# Patient Record
Sex: Male | Born: 1935 | Race: White | Hispanic: No | Marital: Married | Smoking: Former smoker
Health system: Southern US, Community
[De-identification: ages and names within clinical notes are randomized; demographics above are authoritative.]

## PROBLEM LIST (undated history)

## (undated) DIAGNOSIS — I4891 Unspecified atrial fibrillation: Secondary | ICD-10-CM

## (undated) DIAGNOSIS — F329 Major depressive disorder, single episode, unspecified: Secondary | ICD-10-CM

## (undated) DIAGNOSIS — B351 Tinea unguium: Secondary | ICD-10-CM

## (undated) DIAGNOSIS — L98499 Non-pressure chronic ulcer of skin of other sites with unspecified severity: Secondary | ICD-10-CM

## (undated) DIAGNOSIS — F32A Depression, unspecified: Secondary | ICD-10-CM

## (undated) DIAGNOSIS — H5440 Blindness, one eye, unspecified eye: Secondary | ICD-10-CM

## (undated) DIAGNOSIS — T8092XA Unspecified transfusion reaction, initial encounter: Secondary | ICD-10-CM

## (undated) DIAGNOSIS — H3410 Central retinal artery occlusion, unspecified eye: Secondary | ICD-10-CM

## (undated) DIAGNOSIS — C61 Malignant neoplasm of prostate: Secondary | ICD-10-CM

## (undated) DIAGNOSIS — A698 Other specified spirochetal infections: Secondary | ICD-10-CM

## (undated) DIAGNOSIS — Z86718 Personal history of other venous thrombosis and embolism: Secondary | ICD-10-CM

## (undated) DIAGNOSIS — K922 Gastrointestinal hemorrhage, unspecified: Secondary | ICD-10-CM

## (undated) DIAGNOSIS — M1990 Unspecified osteoarthritis, unspecified site: Secondary | ICD-10-CM

## (undated) DIAGNOSIS — E785 Hyperlipidemia, unspecified: Secondary | ICD-10-CM

## (undated) DIAGNOSIS — Z860100 Personal history of colon polyps, unspecified: Secondary | ICD-10-CM

## (undated) DIAGNOSIS — I10 Essential (primary) hypertension: Secondary | ICD-10-CM

## (undated) DIAGNOSIS — D62 Acute posthemorrhagic anemia: Secondary | ICD-10-CM

## (undated) DIAGNOSIS — I2510 Atherosclerotic heart disease of native coronary artery without angina pectoris: Secondary | ICD-10-CM

## (undated) DIAGNOSIS — G4700 Insomnia, unspecified: Secondary | ICD-10-CM

## (undated) HISTORY — DX: Central retinal artery occlusion, unspecified eye: H34.10

## (undated) HISTORY — DX: Atherosclerotic heart disease of native coronary artery without angina pectoris: I25.10

## (undated) HISTORY — DX: Malignant neoplasm of prostate: C61

## (undated) HISTORY — DX: Personal history of colon polyps, unspecified: Z86.0100

## (undated) HISTORY — PX: OTHER SURGICAL HISTORY: SHX169

## (undated) HISTORY — DX: Personal history of colonic polyps: Z86.010

## (undated) HISTORY — DX: Unspecified transfusion reaction, initial encounter: T80.92XA

## (undated) HISTORY — DX: Personal history of other venous thrombosis and embolism: Z86.718

## (undated) HISTORY — DX: Acute posthemorrhagic anemia: D62

## (undated) HISTORY — PX: EYE SURGERY: SHX253

## (undated) HISTORY — DX: Depression, unspecified: F32.A

## (undated) HISTORY — DX: Unspecified atrial fibrillation: I48.91

## (undated) HISTORY — DX: Tinea unguium: B35.1

## (undated) HISTORY — DX: Major depressive disorder, single episode, unspecified: F32.9

## (undated) HISTORY — DX: Hyperlipidemia, unspecified: E78.5

## (undated) HISTORY — DX: Essential (primary) hypertension: I10

## (undated) HISTORY — DX: Gastrointestinal hemorrhage, unspecified: K92.2

---

## 1987-06-10 HISTORY — PX: CORONARY ARTERY BYPASS GRAFT: SHX141

## 1991-06-10 HISTORY — PX: INGUINAL HERNIA REPAIR: SUR1180

## 2001-06-09 HISTORY — PX: OTHER SURGICAL HISTORY: SHX169

## 2005-06-09 HISTORY — PX: CORONARY ARTERY BYPASS GRAFT: SHX141

## 2005-06-09 HISTORY — PX: MITRAL VALVE REPLACEMENT: SHX147

## 2006-12-22 ENCOUNTER — Encounter (HOSPITAL_COMMUNITY): Admission: RE | Admit: 2006-12-22 | Discharge: 2007-01-08 | Payer: Self-pay | Admitting: *Deleted

## 2007-01-24 ENCOUNTER — Emergency Department (HOSPITAL_COMMUNITY): Admission: EM | Admit: 2007-01-24 | Discharge: 2007-01-24 | Payer: Self-pay | Admitting: Emergency Medicine

## 2007-02-17 ENCOUNTER — Encounter (HOSPITAL_COMMUNITY): Admission: RE | Admit: 2007-02-17 | Discharge: 2007-05-18 | Payer: Self-pay | Admitting: *Deleted

## 2007-03-25 ENCOUNTER — Ambulatory Visit (HOSPITAL_COMMUNITY): Admission: RE | Admit: 2007-03-25 | Discharge: 2007-03-25 | Payer: Self-pay | Admitting: Cardiology

## 2007-05-23 ENCOUNTER — Other Ambulatory Visit: Payer: Self-pay | Admitting: Urology

## 2007-05-23 ENCOUNTER — Inpatient Hospital Stay (HOSPITAL_COMMUNITY): Admission: AD | Admit: 2007-05-23 | Discharge: 2007-05-27 | Payer: Self-pay | Admitting: Urology

## 2007-09-06 ENCOUNTER — Ambulatory Visit (HOSPITAL_COMMUNITY): Admission: RE | Admit: 2007-09-06 | Discharge: 2007-09-07 | Payer: Self-pay | Admitting: Urology

## 2007-09-09 ENCOUNTER — Emergency Department (HOSPITAL_COMMUNITY): Admission: EM | Admit: 2007-09-09 | Discharge: 2007-09-10 | Payer: Self-pay | Admitting: Emergency Medicine

## 2008-06-09 HISTORY — PX: A FLUTTER ABLATION: SHX5348

## 2008-06-20 ENCOUNTER — Encounter: Payer: Self-pay | Admitting: Family Medicine

## 2008-10-09 ENCOUNTER — Ambulatory Visit (HOSPITAL_COMMUNITY): Admission: RE | Admit: 2008-10-09 | Discharge: 2008-10-09 | Payer: Self-pay | Admitting: *Deleted

## 2008-10-09 ENCOUNTER — Encounter (INDEPENDENT_AMBULATORY_CARE_PROVIDER_SITE_OTHER): Payer: Self-pay | Admitting: *Deleted

## 2008-10-16 ENCOUNTER — Encounter: Payer: Self-pay | Admitting: Family Medicine

## 2009-03-24 ENCOUNTER — Emergency Department (HOSPITAL_COMMUNITY): Admission: EM | Admit: 2009-03-24 | Discharge: 2009-03-24 | Payer: Self-pay | Admitting: Family Medicine

## 2009-05-28 ENCOUNTER — Encounter: Payer: Self-pay | Admitting: Family Medicine

## 2009-06-09 HISTORY — PX: OTHER SURGICAL HISTORY: SHX169

## 2009-06-15 ENCOUNTER — Encounter: Payer: Self-pay | Admitting: Internal Medicine

## 2009-07-23 ENCOUNTER — Ambulatory Visit: Payer: Self-pay | Admitting: Family Medicine

## 2009-07-23 DIAGNOSIS — I10 Essential (primary) hypertension: Secondary | ICD-10-CM | POA: Insufficient documentation

## 2009-07-23 DIAGNOSIS — Z860100 Personal history of colon polyps, unspecified: Secondary | ICD-10-CM | POA: Insufficient documentation

## 2009-07-23 DIAGNOSIS — R32 Unspecified urinary incontinence: Secondary | ICD-10-CM | POA: Insufficient documentation

## 2009-07-23 DIAGNOSIS — E785 Hyperlipidemia, unspecified: Secondary | ICD-10-CM

## 2009-07-23 DIAGNOSIS — I2510 Atherosclerotic heart disease of native coronary artery without angina pectoris: Secondary | ICD-10-CM | POA: Insufficient documentation

## 2009-07-23 DIAGNOSIS — C61 Malignant neoplasm of prostate: Secondary | ICD-10-CM

## 2009-08-16 ENCOUNTER — Ambulatory Visit: Payer: Self-pay | Admitting: Family Medicine

## 2009-08-16 DIAGNOSIS — B351 Tinea unguium: Secondary | ICD-10-CM | POA: Insufficient documentation

## 2009-08-22 ENCOUNTER — Telehealth: Payer: Self-pay | Admitting: Family Medicine

## 2010-05-07 ENCOUNTER — Ambulatory Visit: Payer: Self-pay | Admitting: Cardiology

## 2010-05-07 ENCOUNTER — Encounter: Payer: Self-pay | Admitting: Family Medicine

## 2010-05-07 ENCOUNTER — Ambulatory Visit: Payer: Self-pay | Admitting: Family Medicine

## 2010-05-07 DIAGNOSIS — IMO0002 Reserved for concepts with insufficient information to code with codable children: Secondary | ICD-10-CM | POA: Insufficient documentation

## 2010-05-07 DIAGNOSIS — I4892 Unspecified atrial flutter: Secondary | ICD-10-CM | POA: Insufficient documentation

## 2010-05-08 LAB — CONVERTED CEMR LAB
ALT: 24 units/L (ref 0–53)
AST: 21 units/L (ref 0–37)
Albumin: 4.3 g/dL (ref 3.5–5.2)
Alkaline Phosphatase: 86 units/L (ref 39–117)
BUN: 17 mg/dL (ref 6–23)
Basophils Absolute: 0 10*3/uL (ref 0.0–0.1)
Basophils Relative: 0.4 % (ref 0.0–3.0)
Bilirubin, Direct: 0.1 mg/dL (ref 0.0–0.3)
CO2: 29 meq/L (ref 19–32)
Calcium: 9.3 mg/dL (ref 8.4–10.5)
Chloride: 103 meq/L (ref 96–112)
Cholesterol: 160 mg/dL (ref 0–200)
Creatinine, Ser: 1.1 mg/dL (ref 0.4–1.5)
Eosinophils Absolute: 0.1 10*3/uL (ref 0.0–0.7)
Eosinophils Relative: 1.5 % (ref 0.0–5.0)
GFR calc non Af Amer: 67.41 mL/min (ref >60)
Glucose, Bld: 96 mg/dL (ref 70–99)
HCT: 42.6 % (ref 39.0–52.0)
HDL: 52.5 mg/dL (ref >39.00)
Hemoglobin: 14.7 g/dL (ref 13.0–17.0)
LDL Cholesterol: 88 mg/dL (ref 0–99)
Lymphocytes Relative: 23.6 % (ref 12.0–46.0)
Lymphs Abs: 2 10*3/uL (ref 0.7–4.0)
MCHC: 34.5 g/dL (ref 30.0–36.0)
MCV: 92.7 fL (ref 78.0–100.0)
Monocytes Absolute: 0.6 10*3/uL (ref 0.1–1.0)
Monocytes Relative: 7.1 % (ref 3.0–12.0)
Neutro Abs: 5.7 10*3/uL (ref 1.4–7.7)
Neutrophils Relative %: 67.4 % (ref 43.0–77.0)
Platelets: 183 10*3/uL (ref 150.0–400.0)
Potassium: 4.3 meq/L (ref 3.5–5.1)
RBC: 4.59 M/uL (ref 4.22–5.81)
RDW: 13.9 % (ref 11.5–14.6)
Sodium: 140 meq/L (ref 135–145)
TSH: 0.77 microintl units/mL (ref 0.35–5.50)
Total Bilirubin: 0.5 mg/dL (ref 0.3–1.2)
Total CHOL/HDL Ratio: 3
Total Protein: 7 g/dL (ref 6.0–8.3)
Triglycerides: 96 mg/dL (ref 0.0–149.0)
VLDL: 19.2 mg/dL (ref 0.0–40.0)
WBC: 8.5 10*3/uL (ref 4.5–10.5)

## 2010-05-14 ENCOUNTER — Ambulatory Visit: Payer: Self-pay | Admitting: Cardiovascular Disease

## 2010-05-21 ENCOUNTER — Ambulatory Visit: Payer: Self-pay | Admitting: Cardiology

## 2010-06-04 ENCOUNTER — Encounter (INDEPENDENT_AMBULATORY_CARE_PROVIDER_SITE_OTHER): Payer: Self-pay | Admitting: *Deleted

## 2010-06-04 ENCOUNTER — Ambulatory Visit: Payer: Self-pay | Admitting: Internal Medicine

## 2010-06-04 ENCOUNTER — Encounter: Payer: Self-pay | Admitting: Internal Medicine

## 2010-06-05 ENCOUNTER — Ambulatory Visit (HOSPITAL_COMMUNITY)
Admission: RE | Admit: 2010-06-05 | Discharge: 2010-06-05 | Payer: Self-pay | Source: Home / Self Care | Attending: Cardiology | Admitting: Cardiology

## 2010-06-14 ENCOUNTER — Ambulatory Visit: Admission: RE | Admit: 2010-06-14 | Discharge: 2010-06-14 | Payer: Self-pay | Source: Home / Self Care

## 2010-06-14 LAB — CONVERTED CEMR LAB: POC INR: 1.5

## 2010-06-18 ENCOUNTER — Emergency Department (HOSPITAL_COMMUNITY)
Admission: EM | Admit: 2010-06-18 | Discharge: 2010-06-18 | Payer: Self-pay | Source: Home / Self Care | Admitting: Emergency Medicine

## 2010-06-19 ENCOUNTER — Telehealth: Payer: Self-pay | Admitting: Internal Medicine

## 2010-06-19 ENCOUNTER — Encounter: Payer: Self-pay | Admitting: Internal Medicine

## 2010-06-19 ENCOUNTER — Ambulatory Visit: Admission: RE | Admit: 2010-06-19 | Discharge: 2010-06-19 | Payer: Self-pay | Source: Home / Self Care

## 2010-06-20 ENCOUNTER — Telehealth: Payer: Self-pay | Admitting: Internal Medicine

## 2010-06-21 ENCOUNTER — Ambulatory Visit: Admission: RE | Admit: 2010-06-21 | Discharge: 2010-06-21 | Payer: Self-pay | Source: Home / Self Care

## 2010-06-21 LAB — CONVERTED CEMR LAB
INR: 3.5
POC INR: 3.5

## 2010-06-24 ENCOUNTER — Telehealth: Payer: Self-pay | Admitting: Internal Medicine

## 2010-06-24 LAB — POCT I-STAT, CHEM 8
BUN: 13 mg/dL (ref 6–23)
Calcium, Ion: 1.15 mmol/L (ref 1.12–1.32)
Chloride: 100 mEq/L (ref 96–112)
Creatinine, Ser: 1.2 mg/dL (ref 0.4–1.5)
Glucose, Bld: 94 mg/dL (ref 70–99)
HCT: 45 % (ref 39.0–52.0)
Hemoglobin: 15.3 g/dL (ref 13.0–17.0)
Potassium: 4 mEq/L (ref 3.5–5.1)
Sodium: 137 mEq/L (ref 135–145)
TCO2: 28 mmol/L (ref 0–100)

## 2010-06-24 LAB — CBC
HCT: 43.8 % (ref 39.0–52.0)
Hemoglobin: 15.4 g/dL (ref 13.0–17.0)
MCH: 31.2 pg (ref 26.0–34.0)
MCHC: 35.2 g/dL (ref 30.0–36.0)
MCV: 88.7 fL (ref 78.0–100.0)
Platelets: 168 10*3/uL (ref 150–400)
RBC: 4.94 MIL/uL (ref 4.22–5.81)
RDW: 13 % (ref 11.5–15.5)
WBC: 7.9 10*3/uL (ref 4.0–10.5)

## 2010-06-24 LAB — DIFFERENTIAL
Basophils Absolute: 0 10*3/uL (ref 0.0–0.1)
Basophils Relative: 0 % (ref 0–1)
Eosinophils Absolute: 0.2 10*3/uL (ref 0.0–0.7)
Eosinophils Relative: 2 % (ref 0–5)
Lymphocytes Relative: 22 % (ref 12–46)
Lymphs Abs: 1.7 10*3/uL (ref 0.7–4.0)
Monocytes Absolute: 0.5 10*3/uL (ref 0.1–1.0)
Monocytes Relative: 7 % (ref 3–12)
Neutro Abs: 5.5 10*3/uL (ref 1.7–7.7)
Neutrophils Relative %: 69 % (ref 43–77)

## 2010-06-24 LAB — PROTIME-INR
INR: 2.09 — ABNORMAL HIGH (ref 0.00–1.49)
Prothrombin Time: 23.6 seconds — ABNORMAL HIGH (ref 11.6–15.2)

## 2010-06-27 NOTE — Consult Note (Signed)
  [name], [name] NO.:  [id number]  MEDICAL RECORD NO.:  [id number]          PATIENT TYPE:  EMS  LOCATION:  [facility]                         FACILITY:  [facility]  PHYSICIAN:  Duke Salvia, MD, FACCDATE OF BIRTH:  [birth year]  DATE OF CONSULTATION:  06/18/2010 DATE OF DISCHARGE:  06/18/2010                                CONSULTATION   Mr. Vig is seen because of central retinal artery occlusion.  He has a history of coronary artery bypass surgery and mitral valve repair in 2007.  He had atrial flutter in 2010 for which he underwent cardioversion.  He had recurrent atrial flutter and was seen by me.  It was typical but because of the atriotomy, the concern about him having left atrial focus was raised, hence he was cardioverted because of exercise intolerance and symptoms with the anticipation of being seen by Dr. Johney Frame for perhaps complex of atrial ablation.  Today while driving, he had transient loss of vision in his left eye, it recovered, and then he had loss of vision again which has been persistent.  He has undergone the procedure to try to ameliorate this.  His INR on arrival was 2.09.  He has no known carotid disease.  His past medical history in addition to the above is notable for hypertension, hyperlipidemia, prostate cancer, and colonic polyps.  His past surgical history is notable for bilateral hernias, radical prostatectomy, colonoscopy, and bladder pump.  FAMILY HISTORY:  Noncontributory.  SOCIAL HISTORY:  He is retired Sport and exercise psychologist.  He is raising his 39- year-old granddaughter __________.  REVIEW OF SYSTEMS:  Negative.  PHYSICAL EXAMINATION:  VITAL SIGNS:  Stable. LUNGS:  Clear. HEART:  His sounds were regular.  His carotids were brisk and full bilaterally without bruits. EYES:  His left eye was patched. ABDOMEN:  Soft. EXTREMITIES:  Without edema.  LABORATORY DATA:  Notable for the INRs as noted above.  IMPRESSION: 1.  Central retinal artery occlusion. 2. Atrial flutter - typical and atypical status post cardioversion 2     weeks ago. 3. Status post mitral valve repair and CABG.  We will plan to continue him on Coumadin.  Review of the up to date would concur that aspirin is not to be effective to be incrementally beneficial.  We will obtain carotid Dopplers as this is the most common cause for central retinal artery occlusion, although cardiogenic source of embolism is the second most common cause.  In event that he has carotid disease that is significant ipsilateral, he will be referred for vascular surgery.  The timing will be an issue.  In the event that it is not, then we will have to rethink the plan about complex ablation as the recommendation would be long-term anticoagulation not withstanding successful ablation.     Duke Salvia, MD, [facility]     SCK/MEDQ  D:  06/18/2010  T:  06/19/2010  Job:  [id number]  Electronically Signed by Sherryl Manges MD [facility] on 06/27/2010 01:40:48 PM

## 2010-06-28 ENCOUNTER — Ambulatory Visit: Admission: RE | Admit: 2010-06-28 | Discharge: 2010-06-28 | Payer: Self-pay | Source: Home / Self Care

## 2010-06-28 LAB — CONVERTED CEMR LAB: POC INR: 2.7

## 2010-07-05 ENCOUNTER — Ambulatory Visit
Admission: RE | Admit: 2010-07-05 | Discharge: 2010-07-05 | Payer: Self-pay | Source: Home / Self Care | Attending: Internal Medicine | Admitting: Internal Medicine

## 2010-07-05 ENCOUNTER — Telehealth: Payer: Self-pay | Admitting: Internal Medicine

## 2010-07-05 LAB — CONVERTED CEMR LAB: POC INR: 2.5

## 2010-07-08 ENCOUNTER — Ambulatory Visit: Admit: 2010-07-08 | Payer: Self-pay | Admitting: Internal Medicine

## 2010-07-09 NOTE — Assessment & Plan Note (Signed)
Summary: NEW PT EST // RS   Vital Signs:  Patient profile:   77 year old male Height:      67 inches Weight:      226 pounds BMI:     35.52 Temp:     98.6 degrees F oral Pulse rate:   134 / minute BP sitting:   154 / 76  (left arm) Cuff size:   large  Vitals Entered By: Alfred Levins, CMA (July 23, 2009 9:21 AM) CC: establish   History of Present Illness: 77 yr old male here with his wife to establish with [place] after transfering from Dr. Trula Slade. He is doing well at the present time.   Preventive Screening-Counseling & Management  Alcohol-Tobacco     Smoking Status: never  Caffeine-Diet-Exercise     Does Patient Exercise: no      Drug Use:  no.        Blood Transfusions:  yes.    Allergies (verified): No Known Drug Allergies  Past History:  Past Medical History: Chickenpox Prostate Cancer, per Dr. Isabel Caprice Blood transfusion Colonic polyps, hx of Coronary artery disease, sees Dr. Peter [place] normal stress test 06-2009 Hyperlipidemia Hypertension Urinary incontinence  Past Surgical History: Heart bypass (4 vessel) in 1989 repeat heart bypass and mitral valve replacement 2007 bilateral inguinal hernia repairs 1993 radical Prostatectomy with radiation 2003 colonoscopy 2009, had adenomatous polyps, repeat in 5 yrs  Family History: Reviewed history and no changes required. Family History of Alcoholism/Addiction Family History of CAD Male 1st degree relative <50 Family History Hypertension Family History of Stroke M 1st degree relative <50 daughter died of renal cell cancer  Social History: Reviewed history and no changes required. 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 Alcohol use-yes Drug use-no Regular exercise-no raising an 37 yr old daughter adopted from [place] Transfusions:  yes Smoking Status:  never Drug Use:  no Does Patient Exercise:  no  Review of Systems  The patient denies  anorexia, fever, weight loss, weight gain, vision loss, decreased hearing, hoarseness, chest pain, syncope, dyspnea on exertion, peripheral edema, prolonged cough, headaches, hemoptysis, abdominal pain, melena, hematochezia, severe indigestion/heartburn, hematuria, incontinence, genital sores, muscle weakness, suspicious skin lesions, transient blindness, difficulty walking, depression, unusual weight change, abnormal bleeding, enlarged lymph nodes, angioedema, breast masses, and testicular masses.    Physical Exam  General:  overweight-appearing.   Neck:  No deformities, masses, or tenderness noted. Lungs:  Normal respiratory effort, chest expands symmetrically. Lungs are clear to auscultation, no crackles or wheezes. Heart:  Normal rate and regular rhythm. S1 and S2 normal without gallop, murmur, click, rub or other extra sounds.   Impression & Recommendations:  Problem # 1:  HYPERTENSION (ICD-401.9)  His updated medication list for this problem includes:    Metoprolol Tartrate 25 Mg Tabs (Metoprolol tartrate) .Marland Kitchen... 1/2 once daily    Avapro 75 Mg Tabs (Irbesartan) .Marland Kitchen... 1 by mouth daily  Problem # 2:  HYPERLIPIDEMIA (ICD-272.4)  His updated medication list for this problem includes:    Simvastatin 40 Mg Tabs (Simvastatin) .Marland Kitchen... Take one tablet at bedtime    Niacin 250 Mg Tabs (Niacin) ..... Once daily  Problem # 3:  CORONARY ARTERY DISEASE (ICD-414.00)  His updated medication list for this problem includes:    Metoprolol Tartrate 25 Mg Tabs (Metoprolol tartrate) .Marland Kitchen... 1/2 once daily    Avapro 75 Mg Tabs (Irbesartan) .Marland Kitchen... 1 by mouth daily    Aspirin 81 Mg  Tbec (Aspirin) ..... One by mouth every day  Problem # 4:  COLONIC POLYPS, HX OF (ICD-V12.72)  Problem # 5:  PROSTATE CANCER (ICD-185)  Complete Medication List: 1)  Metoprolol Tartrate 25 Mg Tabs (Metoprolol tartrate) .... 1/2 once daily 2)  Simvastatin 40 Mg Tabs (Simvastatin) .... Take one tablet at bedtime 3)  Avapro 75  Mg Tabs (Irbesartan) .Marland Kitchen.. 1 by mouth daily 4)  Niacin 250 Mg Tabs (Niacin) .... Once daily 5)  Aspirin 81 Mg Tbec (Aspirin) .... One by mouth every day 6)  Glucosamine-chondroitin 500-400 Mg Caps (Glucosamine-chondroitin) .... Once daily 7)  Multivitamins Tabs (Multiple vitamin) .... Once daily 8)  Resveratrol 100 Mg Caps (Resveratrol) .... Once daily  Patient Instructions: 1)  get old records. Due for a cpx later this summer.

## 2010-07-09 NOTE — Letter (Signed)
Summary: Records from [facility] 2008 - 2010  Records from [facility] 2008 - 2010   Imported By: Maryln Gottron 08/07/2009 14:03:48  _____________________________________________________________________  External Attachment:    Type:   Image     Comment:   External Document

## 2010-07-09 NOTE — Progress Notes (Signed)
Summary: Pt called to check on status of refill for Hydrocodone Syrup  Phone Note Call from Patient Call back at Home Phone [phone]   Caller: Patient Summary of Call: Pt called and still has cough. Pt is requesting refill of Hydrocodone Syrup  [facility] [address] Pt said that the pharmacy sent a refill request for this yesterday. Initial call taken by: Lucy Antigua,  August 22, 2009 4:00 PM  Follow-up for Phone Call        we did not receive any such request from a pharmacy, but I'll be glad to refill this. Call in Hydromet 1 tsp q 4 hours as needed cough, 240 ml with no rf Follow-up by: Nelwyn Salisbury MD,  August 22, 2009 5:25 PM  Additional Follow-up for Phone Call Additional follow up Details #1::        faxed. Additional Follow-up by: Raechel Ache, RN,  August 22, 2009 5:26 PM

## 2010-07-09 NOTE — Assessment & Plan Note (Signed)
Summary: cough/?toenail fungus/njr   Vital Signs:  Patient profile:   77 year old male Temp:     98.8 degrees F oral BP sitting:   130 / 80  (left arm) Cuff size:   large  Vitals Entered By: Sid Falcon LPN (August 16, 2009 1:47 PM) CC: cough, toenail fungus   History of Present Illness: Acute visit for the following items.  3 day history of cough which is mostly nonproductive and nasal congestion. No fever. Nonsmoker. No significant body aches. 2 episodes of similar illness this winter.denies any nausea, vomiting, or diarrhea. Increased nighttime cough not relieved with over-the-counter medication.  Probable toenail fungus left foot. Nonpainful. History similar process right foot treated with oral antifungals several years ago.  Allergies: No Known Drug Allergies  Past History:  Past Medical History: Last updated: 07/23/2009 Chickenpox Prostate Cancer, per Dr. Isabel Caprice Blood transfusion Colonic polyps, hx of Coronary artery disease, sees Dr. Peter [place]and normal stress test 06-2009 Hyperlipidemia Hypertension Urinary incontinence PMH reviewed for relevance  Review of Systems      See HPI  Physical Exam  General:  Well-developed,well-nourished,in no acute distress; alert,appropriate and cooperative throughout examination Ears:  External ear exam shows no significant lesions or deformities.  Otoscopic examination reveals clear canals, tympanic membranes are intact bilaterally without bulging, retraction, inflammation or discharge. Hearing is grossly normal bilaterally. Nose:  External nasal examination shows no deformity or inflammation. Nasal mucosa are pink and moist without lesions or exudates. Mouth:  Oral mucosa and oropharynx without lesions or exudates.  Teeth in good repair. Neck:  No deformities, masses, or tenderness noted. Lungs:  Normal respiratory effort, chest expands symmetrically. Lungs are clear to auscultation, no crackles or wheezes. Heart:  normal  rate and regular rhythm.   Extremities:  left foot reveals discolored toenail great toe and some thickened brittle changes of other toenails involving that foot. Right-sided toes normal   Impression & Recommendations:  Problem # 1:  ACUTE BRONCHITIS (ICD-466.0)  suspect viral. Cough medication for suppression as needed.  His updated medication list for this problem includes:    Hydrocodone-homatropine 5-1.5 Mg/51ml Syrp (Hydrocodone-homatropine) ..... One tsp by mouth q 4-6 hours as needed cough  Problem # 2:  ONYCHOMYCOSIS (ICD-110.1) discussed possible treatment options. At this point he is not sure he wishes to pursue this. We will discuss with Dr. Clent Ridges he is further interest.  Complete Medication List: 1)  Metoprolol Tartrate 25 Mg Tabs (Metoprolol tartrate) .... 1/2 once daily 2)  Simvastatin 40 Mg Tabs (Simvastatin) .... Take one tablet at bedtime 3)  Avapro 75 Mg Tabs (Irbesartan) .Marland Kitchen.. 1 by mouth daily 4)  Niacin 250 Mg Tabs (Niacin) .... Once daily 5)  Aspirin 81 Mg Tbec (Aspirin) .... One by mouth every day 6)  Glucosamine-chondroitin 500-400 Mg Caps (Glucosamine-chondroitin) .... Once daily 7)  Multivitamins Tabs (Multiple vitamin) .... Once daily 8)  Resveratrol 100 Mg Caps (Resveratrol) .... Once daily 9)  Hydrocodone-homatropine 5-1.5 Mg/29ml Syrp (Hydrocodone-homatropine) .... One tsp by mouth q 4-6 hours as needed cough  Patient Instructions: 1)  Acute Bronchitis symptoms for less then 10 days are not  helped by antibiotics. Take over the counter cough medications. Call if no improvement in 5-7 days, sooner if increasing cough, fever, or new symptoms ( shortness of breath, chest pain) .  Prescriptions: HYDROCODONE-HOMATROPINE 5-1.5 MG/5ML SYRP (HYDROCODONE-HOMATROPINE) one tsp by mouth q 4-6 hours as needed cough  #120 ml x 0   Entered and Authorized by:   Evelena Peat  MD   Signed by:   Evelena Peat MD on 08/16/2009   Method used:   Print then Give to Patient    RxID:   [id number]

## 2010-07-09 NOTE — Assessment & Plan Note (Signed)
Summary: CPX // RS/PT RSC/CJR--pt will fast for medicare///ccm   Vital Signs:  Patient profile:   77 year old male Height:      66.5 inches Weight:      228 pounds BMI:     36.38 O2 Sat:      96 % Temp:     97.6 degrees F Pulse rate:   94 / minute BP sitting:   140 / 80  (left arm) Cuff size:   large  Vitals Entered By: Pura Spice, RN (May 07, 2010 10:43 AM) CC: cpx fasting  c/o left knee pain  SOB  increasing getting worse   sees Dr Peter Swaziland last seen 05-2009.  pt states he is not happy with Dr Swaziland and requesting another Cardiologist   History of Present Illness: 77 yr old male for a cpx. He has 2 problems to discuss today. First he has had some SOB on exertion, such as walking distances or going up stairs, for several months. No chest pain or nausea or sweats or palpitations. He had been seeing Dr. Peter [place]iland for cardiology care, but he would like to switch to our [facility] cardiologists. Second, 2 weeks ago after squatting on his knees to change a blade on his lawn mower, he developed sharp pains in the left knee. No swelling. He has been taking some Motrin and wearing an elastic support sleeve. The knee is steadily getting better now, and he no longer is taking any meds for it. Still wearing the sleeve. He is scheduled for a colonoscopy with the [facility] next month.   Allergies (verified): No Known Drug Allergies  Past History:  Past Medical History: Reviewed history from 07/23/2009 and no changes required. Chickenpox Prostate Cancer, per Dr. Isabel Caprice Blood transfusion Colonic polyps, hx of Coronary artery disease, sees Dr. Peter [place]il[place] normal stress test 06-2009 Hyperlipidemia Hypertension Urinary incontinence  Past Surgical History: Heart bypass (4 vessel) in 1989 repeat heart bypass and mitral valve replacement 2007 bilateral inguinal hernia repairs 1993 radical Prostatectomy with radiation 2003 colonoscopy 2009, had adenomatous polyps, repeat in 5  yrs ablation for atrial fibrillation 2010 installation of bladder pump 2011 per Dr. McDiarmid  Family History: Reviewed history from 07/23/2009 and no changes required. Family History of Alcoholism/Addiction Family History of CAD Male 1st degree relative <50 Family History Hypertension Family History of Stroke M 1st degree relative <50 daughter died of renal cell cancer  Social History: Reviewed history from 07/23/2009 and no changes required. 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 Alcohol use-yes Drug use-no Regular exercise-no raising an 73 yr old daughter adopted from [place]  Review of Systems  The patient denies anorexia, fever, weight loss, weight gain, vision loss, decreased hearing, hoarseness, chest pain, syncope, peripheral edema, prolonged cough, headaches, hemoptysis, abdominal pain, melena, hematochezia, severe indigestion/heartburn, hematuria, incontinence, genital sores, muscle weakness, suspicious skin lesions, transient blindness, difficulty walking, depression, unusual weight change, abnormal bleeding, enlarged lymph nodes, angioedema, breast masses, and testicular masses.    Physical Exam  General:  overweight-appearing.  Walks with a cane. Head:  Normocephalic and atraumatic without obvious abnormalities. No apparent alopecia or balding. Eyes:  No corneal or conjunctival inflammation noted. EOMI. Perrla. Funduscopic exam benign, without hemorrhages, exudates or papilledema. Vision grossly normal. Ears:  External ear exam shows no significant lesions or deformities.  Otoscopic examination reveals clear canals, tympanic membranes are intact bilaterally without bulging, retraction, inflammation or discharge. Hearing is grossly normal  bilaterally. Nose:  External nasal examination shows no deformity or inflammation. Nasal mucosa are pink and moist without lesions or exudates. Mouth:  Oral mucosa and oropharynx without  lesions or exudates.  Teeth in good repair. Neck:  No deformities, masses, or tenderness noted. Chest Wall:  No deformities, masses, tenderness or gynecomastia noted. Lungs:  Normal respiratory effort, chest expands symmetrically. Lungs are clear to auscultation, no crackles or wheezes. Heart:  normal rate, regular rhythm, no gallop, no rub, no JVD, and no HJR.  Soft 2/6 SM over the mitral area. EKG shows a fib/flutter pattern with 3 to 1 conduction. Ventricular rate is well controlled.  Abdomen:  Bowel sounds positive,abdomen soft and non-tender without masses, organomegaly or hernias noted. Msk:  No deformity or scoliosis noted of thoracic or lumbar spine.   Pulses:  R and L carotid,radial,femoral,dorsalis pedis and posterior tibial pulses are full and equal bilaterally Extremities:  No clubbing, cyanosis, edema, or deformity noted with normal full range of motion of all joints.   Neurologic:  No cranial nerve deficits noted. Station and gait are normal. Plantar reflexes are down-going bilaterally. DTRs are symmetrical throughout. Sensory, motor and coordinative functions appear intact. Skin:  Intact without suspicious lesions or rashes Cervical Nodes:  No lymphadenopathy noted Axillary Nodes:  No palpable lymphadenopathy Inguinal Nodes:  No significant adenopathy Psych:  Cognition and judgment appear intact. Alert and cooperative with normal attention span and concentration. No apparent delusions, illusions, hallucinations   Impression & Recommendations:  Problem # 1:  HYPERTENSION (ICD-401.9)  His updated medication list for this problem includes:    Metoprolol Tartrate 25 Mg Tabs (Metoprolol tartrate) .Marland Kitchen... 1/2 once daily    Avapro 75 Mg Tabs (Irbesartan) .Marland Kitchen... 1 by mouth daily  Orders: UA Dipstick w/o Micro (automated)  (81003) Venipuncture (70350) TLB-Lipid Panel (80061-LIPID) TLB-BMP (Basic Metabolic Panel-BMET) (80048-METABOL) TLB-CBC Platelet - w/Differential  (85025-CBCD) TLB-Hepatic/Liver Function Pnl (80076-HEPATIC) TLB-TSH (Thyroid Stimulating Hormone) (84443-TSH) Specimen Handling (09381)  Problem # 2:  HYPERLIPIDEMIA (ICD-272.4)  His updated medication list for this problem includes:    Simvastatin 40 Mg Tabs (Simvastatin) .Marland Kitchen... Take one tablet at bedtime    Niacin 250 Mg Tabs (Niacin) ..... Once daily  Problem # 3:  CORONARY ARTERY DISEASE (ICD-414.00)  His updated medication list for this problem includes:    Metoprolol Tartrate 25 Mg Tabs (Metoprolol tartrate) .Marland Kitchen... 1/2 once daily    Avapro 75 Mg Tabs (Irbesartan) .Marland Kitchen... 1 by mouth daily    Aspirin 81 Mg Tbec (Aspirin) ..... One by mouth every day  Problem # 4:  PROSTATE CANCER (ICD-185)  Problem # 5:  COLONIC POLYPS, HX OF (ICD-V12.72)  Problem # 6:  URINARY INCONTINENCE (ICD-788.30)  Problem # 7:  ATRIAL FIBRILLATION (ICD-427.31)  His updated medication list for this problem includes:    Metoprolol Tartrate 25 Mg Tabs (Metoprolol tartrate) .Marland Kitchen... 1/2 once daily    Aspirin 81 Mg Tbec (Aspirin) ..... One by mouth every day  Orders: EKG w/ Interpretation (93000) Cardiology Referral (Cardiology)  Problem # 8:  KNEE SPRAIN (ICD-844.9)  Complete Medication List: 1)  Metoprolol Tartrate 25 Mg Tabs (Metoprolol tartrate) .... 1/2 once daily 2)  Simvastatin 40 Mg Tabs (Simvastatin) .... Take one tablet at bedtime 3)  Avapro 75 Mg Tabs (Irbesartan) .Marland Kitchen.. 1 by mouth daily 4)  Niacin 250 Mg Tabs (Niacin) .... Once daily 5)  Aspirin 81 Mg Tbec (Aspirin) .... One by mouth every day 6)  Glucosamine-chondroitin 500-400 Mg Caps (Glucosamine-chondroitin) .... Once daily 7)  Multivitamins Tabs (Multiple vitamin) .... Once daily 8)  Resveratrol 100 Mg Caps (Resveratrol) .... Once daily  Patient Instructions: 1)  He seems to be in paroxysmal fib/flutter which is causing some dyspnea on exertion. We will get fasting labs today, and we will arrange for him to see one of our [place]  cardiologists today or tomorrow. He will take it easy until then. He had planned to take an airline flight tomorrow, but I asked him to cancel this and stay home until we get this resolved. He will do so.    Orders Added: 1)  Est. Patient Level V [99215] 2)  UA Dipstick w/o Micro (automated)  [81003] 3)  EKG w/ Interpretation [93000] 4)  Venipuncture [36415] 5)  TLB-Lipid Panel [80061-LIPID] 6)  TLB-BMP (Basic Metabolic Panel-BMET) [80048-METABOL] 7)  TLB-CBC Platelet - w/Differential [85025-CBCD] 8)  TLB-Hepatic/Liver Function Pnl [80076-HEPATIC] 9)  TLB-TSH (Thyroid Stimulating Hormone) [84443-TSH] 10)  Specimen Handling [99000] 11)  Cardiology Referral [Cardiology]  Appended Document: Imaging     Allergies: No Known Drug Allergies   Complete Medication List: 1)  Metoprolol Tartrate 25 Mg Tabs (Metoprolol tartrate) .... 1/2 once daily 2)  Simvastatin 40 Mg Tabs (Simvastatin) .... Take one tablet at bedtime 3)  Avapro 75 Mg Tabs (Irbesartan) .Marland Kitchen.. 1 by mouth daily 4)  Niacin 250 Mg Tabs (Niacin) .... Once daily 5)  Aspirin 81 Mg Tbec (Aspirin) .... One by mouth every day 6)  Glucosamine-chondroitin 500-400 Mg Caps (Glucosamine-chondroitin) .... Once daily 7)  Multivitamins Tabs (Multiple vitamin) .... Once daily 8)  Resveratrol 100 Mg Caps (Resveratrol) .... Once daily      Laboratory Results   Urine Tests    Routine Urinalysis   Color: yellow Appearance: Clear Glucose: negative   (Normal Range: Negative) Bilirubin: negative   (Normal Range: Negative) Ketone: negative   (Normal Range: Negative) Spec. Gravity: 1.015   (Normal Range: 1.003-1.035) Blood: negative   (Normal Range: Negative) pH: 5.0   (Normal Range: 5.0-8.0) Protein: negative   (Normal Range: Negative) Urobilinogen: 0.2   (Normal Range: 0-1) Nitrite: negative   (Normal Range: Negative) Leukocyte Esterace: negative   (Normal Range: Negative)    Comments: Rita Ohara  May 07, 2010 3:52  PM

## 2010-07-11 ENCOUNTER — Encounter: Payer: Self-pay | Admitting: Internal Medicine

## 2010-07-11 ENCOUNTER — Ambulatory Visit (INDEPENDENT_AMBULATORY_CARE_PROVIDER_SITE_OTHER): Payer: Medicare Other | Admitting: Internal Medicine

## 2010-07-11 DIAGNOSIS — I4891 Unspecified atrial fibrillation: Secondary | ICD-10-CM

## 2010-07-11 LAB — CONVERTED CEMR LAB
BUN: 17 mg/dL (ref 6–23)
Basophils Absolute: 0 10*3/uL (ref 0.0–0.1)
Basophils Relative: 0.4 % (ref 0.0–3.0)
CO2: 29 meq/L (ref 19–32)
Calcium: 9.2 mg/dL (ref 8.4–10.5)
Chloride: 99 meq/L (ref 96–112)
Creatinine, Ser: 1.1 mg/dL (ref 0.4–1.5)
Eosinophils Absolute: 0.1 10*3/uL (ref 0.0–0.7)
Eosinophils Relative: 1.7 % (ref 0.0–5.0)
GFR calc non Af Amer: 72.56 mL/min (ref >60.00)
Glucose, Bld: 71 mg/dL (ref 70–99)
HCT: 43.2 % (ref 39.0–52.0)
Hemoglobin: 14.8 g/dL (ref 13.0–17.0)
INR: 3 — ABNORMAL HIGH (ref 0.8–1.0)
Lymphocytes Relative: 28.2 % (ref 12.0–46.0)
Lymphs Abs: 2.2 10*3/uL (ref 0.7–4.0)
MCHC: 34.3 g/dL (ref 30.0–36.0)
MCV: 92.4 fL (ref 78.0–100.0)
Monocytes Absolute: 0.5 10*3/uL (ref 0.1–1.0)
Monocytes Relative: 7 % (ref 3.0–12.0)
Neutro Abs: 4.9 10*3/uL (ref 1.4–7.7)
Neutrophils Relative %: 62.7 % (ref 43.0–77.0)
Platelets: 180 10*3/uL (ref 150.0–400.0)
Potassium: 4.3 meq/L (ref 3.5–5.1)
Prothrombin Time: 32.4 s — ABNORMAL HIGH (ref 9.7–11.8)
RBC: 4.67 M/uL (ref 4.22–5.81)
RDW: 13.5 % (ref 11.5–14.6)
Sodium: 138 meq/L (ref 135–145)
WBC: 7.8 10*3/uL (ref 4.5–10.5)
aPTT: 41.6 s — ABNORMAL HIGH (ref 21.7–28.8)

## 2010-07-11 NOTE — Assessment & Plan Note (Signed)
Summary: NEP/AFLUTTER   Visit Type:  np   History of Present Illness: [name] is seen at the request of Dr. Swaziland because of recurrent atrial flutter.  He has a history of bypass surgery and mitral valve repair in 2007. He was seen in 2010 by Dr. Lemmie Evens. Electrocardiograms are not available from this but atrial flutter cardioversion was described. He then noted increasing symptoms of this fall of exercise intolerance. He was seen by Dr. Swaziland. Recurrent atrial flutter was identified. Also concerns about obesity and the patient has undertaken a significant weight restriction diet and has lost 17 pounds. He continues to have exercise intolerance.  Thromboembolic risk factors are notable for age-41, hypertension-one, vascular disease-1.  Left ventricular function has been identified as normal.  Problems Prior to Update: 1)  Atrial Flutter  (ICD-427.32) 2)  Knee Sprain  (ICD-844.9) 3)  Onychomycosis  (ICD-110.1) 4)  Family History of Cad Male 1st Degree Relative <50  (ICD-V17.3) 5)  Family History of Alcoholism/addiction  (ICD-V61.41) 6)  Urinary Incontinence  (ICD-788.30) 7)  Hypertension  (ICD-401.9) 8)  Hyperlipidemia  (ICD-272.4) 9)  Coronary Artery Disease  (ICD-414.00) 10)  Colonic Polyps, Hx of  (ICD-V12.72) 11)  Prostate Cancer  (ICD-185)  Current Medications (verified): 1)  Metoprolol Tartrate 25 Mg Tabs (Metoprolol Tartrate) .... 1/2 Once Daily 2)  Simvastatin 40 Mg Tabs (Simvastatin) .... Take One Tablet At Bedtime 3)  Avapro 75 Mg  Tabs (Irbesartan) .Marland Kitchen.. 1 By Mouth Daily 4)  Niacin 250 Mg Tabs (Niacin) .... Once Daily 5)  Aspirin 81 Mg  Tbec (Aspirin) .... One By Mouth Every Day 6)  Glucosamine-Chondroitin 500-400 Mg Caps (Glucosamine-Chondroitin) .... Once Daily 7)  Multivitamins  Tabs (Multiple Vitamin) .... Once Daily 8)  Resveratrol 100 Mg Caps (Resveratrol) .... Once Daily 9)  Aspirin 81 Mg Tbec (Aspirin) .... Take One Tablet By Mouth Daily  Allergies  (verified): 1)  ! Niacin  Past History:  Past Medical History: Last updated: 07/23/2009 Chickenpox Prostate Cancer, per Dr. Isabel Caprice Blood transfusion Colonic polyps, hx of Coronary artery disease, sees Dr. Peter Swaziland normal stress test 06-2009 Hyperlipidemia Hypertension Urinary incontinence  Past Surgical History: Last updated: 05/07/2010 Heart bypass (4 vessel) in 1989 repeat heart bypass and mitral valve replacement 2007 bilateral inguinal hernia repairs 1993 radical Prostatectomy with radiation 2003 colonoscopy 2009, had adenomatous polyps, repeat in 5 yrs ablation for atrial fibrillation 2010 installation of bladder pump 2011 per Dr. 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   Vital Signs:  Patient profile:   77 year old male Height:      66.5 inches Weight:      217.50 pounds BMI:     34.70 Pulse rate:   82 / minute BP sitting:   126 / 78  (left arm) Cuff size:   regular  Vitals Entered By: Caralee Ates CMA (June 04, 2010 12:23  PM)  Physical Exam  General:  Well developed, well nourished, over the Caucasian male appearing his stated age, moderately obese in no acute distress. Head:  we'll HEENT Neck:  supple Chest Wall:  without kyphosis or scoliosis Lungs:  good auscultation Heart:   regular but rapid rate without significant murmurs or gallops Abdomen:  soft nontender with active bowel sounds without midline pulsation or hepatomegaly Msk:  without obvious skeletal deformity Pulses:  intact distal pulses Extremities:  without clubbing cyanosis or edema Neurologic:  alert and oriented and grossly normal motor and sensory function Skin:  warm and dry Cervical Nodes:  without adenopathy Psych:  engaging affect   Impression & Recommendations:  Problem # 1:  ATRIAL FLUTTER (ICD-427.32)  the patient has atrial flutter which appears to be typical. However, he undoubtedly has an atriotomy scar on his left atrium related to his mitral valve repair and with isthmus dependent and a atriotomy scar may give rise to a similar flutter wave Appearance when atriotomy is on the left side.  because of this I discussed with the patient had him see Dr. Johney Frame in the event a complex left-sided flutter ablation is necessary. Because he is in persistent flutter with a significant symptoms in the short-term, we will undertake DC cardioversion for restoration of sinus rhythm. The patient's INRs have been greater than 2 for more than 4 weeks.  I will also plan to stop his aspirin.  in the event that his warfarin was discontinued following ablation, and aspirin would need to be resumed The following medications were removed from the medication list:    Aspirin 81 Mg Tbec (Aspirin) .Marland Kitchen... Take one tablet by mouth daily His updated medication list for this problem includes:    Metoprolol Tartrate 25 Mg Tabs (Metoprolol tartrate) .Marland Kitchen... 1/2 once daily    Aspirin 81 Mg Tbec (Aspirin) ..... One by mouth every day    Warfarin Sodium 5 Mg Tabs (Warfarin sodium) .Marland Kitchen... Take as directed  Orders: TLB-BMP (Basic Metabolic Panel-BMET) (80048-METABOL) TLB-CBC Platelet - w/Differential (85025-CBCD) TLB-PT (Protime) (85610-PTP) TLB-PTT (85730-PTTL) EP Referral (Cardiology EP Ref )  Problem # 2:  CORONARY  ARTERY DISEASE (ICD-414.00) stable The following medications were removed from the medication list:    Aspirin 81 Mg Tbec (Aspirin) .Marland Kitchen... Take one tablet by mouth daily His updated medication list for this problem includes:    Metoprolol Tartrate 25 Mg Tabs (Metoprolol tartrate) .Marland Kitchen... 1/2 once daily    Aspirin 81 Mg Tbec (Aspirin) ..... One by mouth every day    Warfarin Sodium 5 Mg Tabs (Warfarin sodium) .Marland Kitchen... Take as directed  Patient Instructions: 1)  Your physician has recommended you make the following change in your medication: Stop Aspirin.  Take 1/2 tab of Coumadin on Wednesday then resume same dose of 1 tablet except 1.5 tablet  on Monday and Friday.  2)  Your physician has recommended that you have a cardioversion (DCCV).  Electrical cardioversion uses a jolt of electricity to your heart either through paddles or wired patches attached to your chest. This is a controlled, usually prescheduled, procedure. Defibrillation is done under light anesthesia in the hospital, and you usually go home the day of the procedure. This is done to get your heart back into a normal rhythm. You are not awake for the procedure. Please see the instruction sheet given to you today. 3)  See Dr. Johney Frame for Flutter ablation.

## 2010-07-11 NOTE — Medication Information (Signed)
Summary: ccr  Anticoagulant Therapy  Managed by: Weston Brass, PharmD Referring MD: Linus Orn MD: Tenny Craw MD, Gunnar Fusi Indication 1: Atrial Fibrillation Lab Used: [facility] [place] Site: [address] INR POC 1.5 INR RANGE 2.0-3.0  Dietary changes: no    Health status changes: no    Bleeding/hemorrhagic complications: no    Recent/future hospitalizations: yes       Details: recently cardioverted.  Possibly pending ablation.  Has [facility] appt on 1/30  Any changes in medication regimen? no    Recent/future dental: no  Any missed doses?: yes     Details: missed Wednesdays dose of Coumadin   Is patient compliant with meds? yes      Comments: Pt previously followed with Dr. Swaziland.  Wants to come to our office prior to procedure then possibly return to [facility] cardiology office.   Allergies: 1)  ! Niacin  Anticoagulation Management History:      The patient is taking warfarin and comes in today for a routine follow up visit.  Positive risk factors for bleeding include an age of 42 years or older.  The bleeding index is 'intermediate risk'.  Positive CHADS2 values include History of HTN.  Negative CHADS2 values include Age > 65 years old.  His last INR was 3.0 ratio.  Anticoagulation responsible provider: Tenny Craw MD, Gunnar Fusi.  INR POC: 1.5.  Cuvette Lot#: [id number].  Exp: 07/2011.    Anticoagulation Management Assessment/Plan:      The patient's current anticoagulation dose is Warfarin sodium 5 mg tabs: take as directed.  The target INR is 2.0-3.0.  The next INR is due 06/21/2010.  Anticoagulation instructions were given to patient.  Results were reviewed/authorized by Weston Brass, PharmD.  He was notified by Weston Brass PharmD.         Current Anticoagulation Instructions: INR 1.5  Take an extra 1 tablet today, 1 1/2 tablets tomorrow then resume same dose of 1 tablet every day except 1 1/2 tablets on Monday and Friday.  Recheck INR in 1 week.

## 2010-07-11 NOTE — Progress Notes (Signed)
Summary: Change in INR range  ---- Converted from flag ---- ---- 06/18/2010 6:00 PM, Nathen May, MD, [facility] wrote: DR S MR Strout had a central retinal artery occlusion.  this is presumed to be thromboembolic  we should change his target to 2.5 thanks steve ------------------------------  Phone Note From Other Clinic       Appended Document: Coumadin Clinic    Anticoagulant Therapy  Managed by: Weston Brass, PharmD Referring MD: Linus Orn MD: Tenny Craw MD, Gunnar Fusi Indication 1: Atrial Fibrillation Indication 2: central retinal artery occulsion Lab Used: [facility] [place] Site: [address] INR RANGE 2.5-3.5          Comments: Note made to update pt's INR range.   Allergies: 1)  ! Niacin  Anticoagulation Management History:      Positive risk factors for bleeding include an age of 75 years or older.  The bleeding index is 'intermediate risk'.  Positive CHADS2 values include History of HTN.  Negative CHADS2 values include Age > 62 years old.  His last INR was 3.0 ratio.  Anticoagulation responsible provider: Tenny Craw MD, Gunnar Fusi.  Exp: 07/2011.    Anticoagulation Management Assessment/Plan:      The patient's current anticoagulation dose is Warfarin sodium 5 mg tabs: take as directed.  The target INR is 2.5-3.5.  The next INR is due 06/21/2010.  Anticoagulation instructions were given to patient.  Results were reviewed/authorized by Weston Brass, PharmD.         Prior Anticoagulation Instructions: INR 1.5  Take an extra 1 tablet today, 1 1/2 tablets tomorrow then resume same dose of 1 tablet every day except 1 1/2 tablets on Monday and Friday.  Recheck INR in 1 week.

## 2010-07-11 NOTE — Medication Information (Signed)
Summary: rov/ewj  Anticoagulant Therapy  Managed by: Bethena Midget, RN, BSN Referring MD: Linus Orn MD: Tenny Craw MD, Gunnar Fusi Indication 1: Atrial Fibrillation Indication 2: central retinal artery occulsion Lab Used: [facility] [place] Site: [address] INR POC 2.5 INR RANGE 2.5-3.5  Dietary changes: no    Health status changes: no    Bleeding/hemorrhagic complications: no    Recent/future hospitalizations: no    Any changes in medication regimen? no    Recent/future dental: no  Any missed doses?: no       Is patient compliant with meds? yes       Allergies: 1)  ! Niacin  Anticoagulation Management History:      The patient is taking warfarin and comes in today for a routine follow up visit.  Positive risk factors for bleeding include an age of 21 years or older.  The bleeding index is 'intermediate risk'.  Positive CHADS2 values include History of HTN.  Negative CHADS2 values include Age > 73 years old.  His last INR was 3.5.  Anticoagulation responsible provider: Tenny Craw MD, Gunnar Fusi.  INR POC: 2.5.  Cuvette Lot#: [id number].  Exp: 06/2011.    Anticoagulation Management Assessment/Plan:      The patient's current anticoagulation dose is Warfarin sodium 5 mg tabs: take as directed.  The target INR is 2.5-3.5.  The next INR is due 07/19/2010.  Anticoagulation instructions were given to patient.  Results were reviewed/authorized by Bethena Midget, RN, BSN.  He was notified by Linward Headland, PharmD candidate.         Prior Anticoagulation Instructions: INR 2.7 Continue 5mg  everyday except 7.5mg s on Wednesdays. Recheck in one week.  Current Anticoagulation Instructions: INR 2.5  Today take 7.5mg  then Take 1 tablet everyday except 1.5 tablets on Wednesdays.   Recheck in 2 weeks.

## 2010-07-11 NOTE — Progress Notes (Signed)
Summary: pt needs call today re appt on monday  Phone Note Call from Patient   Caller: [phone] Reason for Call: Talk to Nurse Summary of Call: called pt to confirm appt and he states he was to see klein and not allred and wants to know why he is seeing [facility]red , wants a call today because appt is on monday Initial call taken by: Glynda Jaeger,  July 05, 2010 3:42 PM  Follow-up for Phone Call        Put patient in to see Dr. Graciela Husbands 07/11/10 @ 10:15am. Will cancel appt. with Dr. Johney Frame. Patient had central retinal artery occlusion and will no longer need atrial flutter ablation. Whitney Maeola Sarah RN  July 05, 2010 4:16 PM  Follow-up by: Whitney Maeola Sarah RN,  July 05, 2010 4:16 PM

## 2010-07-11 NOTE — Letter (Signed)
Summary: Cardioversion/TEE Instructions  Architectural technologist, Main Office  [address]   [place], [place] [address]   Phone: [phone]  Fax: [phone]    Cardioversion / TEE Cardioversion Instructions  06/04/2010 MRN: [id number]  [name] [address] [place], [place]  [address]  Dear [name], You are scheduled for a Cardioversion on June 05, 2010 at 10:00 am with Dr.Crenshaw.   Please arrive at the [facility] at 8:00 a.m. on the day of your procedure.  1)   DIET:  A)   Nothing to eat or drink after midnight except your medications with a sip of water.  2)   Labs today. You do not have to be fasting.  3)   MAKE SURE YOU TAKE YOUR COUMADIN.   B)   YOU MAY TAKE ALL of your remaining medications with a small amount of water.     5)  Must have a responsible person to drive you home.  6)   Bring a current list of your medications and current insurance cards.   * Special Note:  Every effort is made to have your procedure done on time. Occasionally there are emergencies that present themselves at the hospital that may cause delays. Please be patient if a delay does occur.  * If you have any questions after you get home, please call the office at [phone]. Claris Gladden, RN

## 2010-07-11 NOTE — Progress Notes (Signed)
Summary: question on test  Phone Note Call from Patient Call back at [facility] Phone [phone]   Caller: Patient Reason for Call: Talk to Nurse Summary of Call: Pt has question re blockage and test done yesterday. Initial call taken by: Roe Coombs,  June 20, 2010 8:34 AM    please see note attached to lab results--nt

## 2010-07-11 NOTE — Progress Notes (Signed)
Summary: Calling to set  procedure  Phone Note Call from Patient Call back at Home Phone [phone]   Caller: Patient Summary of Call: Calling to set up a procedure Initial call taken by: Judie Grieve,  June 24, 2010 8:21 AM  Follow-up for Phone Call        spoke w/pt and he is concerned about having another stroke and losing sight in his other eye. Dr. Graciela Husbands had mentioned a TEE to determine where clot came from if necessary. Pt adv that his eye doc felt that the clot came from the heart based on the size.  Pt expressed concern as well because he was told that Coumadin good for 80% of clots. Left msg for Dr. Graciela Husbands to call re scheduling TEE.  Follow-up by: Claris Gladden RN,  June 24, 2010 10:41 AM  Additional Follow-up for Phone Call Additional follow up Details #1::        1./16/12 1147 am adv pt that Dr. Graciela Husbands will discuss w/him tomorrow. Pt is very concerned about this clot.  Claris Gladden, RN, BSN    Additional Follow-up for Phone Call Additional follow up Details #2::    spoke with Patient  carotids were negative;  I tried to call eye MD;  he did not answer..  I spoke with the patient and told him the TEE would not add incremental direction to theraoty we will see him in two weeks as schedules Follow-up by: Nathen May, MD, [facility],  June 24, 2010 9:29 PM

## 2010-07-11 NOTE — Medication Information (Signed)
Summary: rov/sp  Anticoagulant Therapy  Managed by: Louann Sjogren, PharmD Referring MD: Linus Orn MD: Patty Sermons MD, Shaneya Taketa Indication 1: Atrial Fibrillation Indication 2: central retinal artery occulsion Lab Used: [facility] [place] Site: [address] INR POC 3.5 INR RANGE 2.5-3.5  Dietary changes: no    Health status changes: no    Bleeding/hemorrhagic complications: yes       Details: Recent blood clot in eye  Recent/future hospitalizations: yes       Details: Recent blood clot in eye- hospital this past week  Any changes in medication regimen? no    Recent/future dental: no  Any missed doses?: no       Is patient compliant with meds? yes       Allergies: 1)  ! Niacin  Anticoagulation Management History:      The patient is taking warfarin and comes in today for a routine follow up visit.  Positive risk factors for bleeding include an age of 75 years or older.  The bleeding index is 'intermediate risk'.  Positive CHADS2 values include History of HTN.  Negative CHADS2 values include Age > 14 years old.  His last INR was 3.0 ratio and today's INR is 3.5.  Anticoagulation responsible provider: Denzil Mceachron MD, Darcell Yacoub.  INR POC: 3.5.  Cuvette Lot#: [id number].  Exp: 07/2011.    Anticoagulation Management Assessment/Plan:      The patient's current anticoagulation dose is Warfarin sodium 5 mg tabs: take as directed.  The target INR is 2.5-3.5.  The next INR is due 06/28/2010.  Anticoagulation instructions were given to patient.  Results were reviewed/authorized by Louann Sjogren, PharmD.         Prior Anticoagulation Instructions: INR 1.5  Take an extra 1 tablet today, 1 1/2 tablets tomorrow then resume same dose of 1 tablet every day except 1 1/2 tablets on Monday and Friday.  Recheck INR in 1 week.   Current Anticoagulation Instructions: INR 3.5 (goal 2.5-3.5)  Take 1 tablet everyday except take 1 1/2 tablets on Wednesdays. Keep vitamin k  containing foods consistent each week.  Vitamin K containing foods thicken the blood and decrease INR. Check INR in 1 week.

## 2010-07-11 NOTE — Medication Information (Signed)
Summary: rov/amr  Anticoagulant Therapy  Managed by: Bethena Midget, RN, BSN Referring MD: Linus Orn MD: Shirlee Latch MD, Jenean Escandon Indication 1: Atrial Fibrillation Indication 2: central retinal artery occulsion Lab Used: [facility] [place] Site: [address] INR POC 2.7 INR RANGE 2.5-3.5  Dietary changes: no    Health status changes: no    Bleeding/hemorrhagic complications: no    Recent/future hospitalizations: no    Any changes in medication regimen? no    Recent/future dental: no  Any missed doses?: no       Is patient compliant with meds? yes       Allergies: 1)  ! Niacin  Anticoagulation Management History:      The patient is taking warfarin and comes in today for a routine follow up visit.  Positive risk factors for bleeding include an age of 75 years or older.  The bleeding index is 'intermediate risk'.  Positive CHADS2 values include History of HTN.  Negative CHADS2 values include Age > 70 years old.  His last INR was 3.5.  Anticoagulation responsible provider: Shirlee Latch MD, Mariaisabel Bodiford.  INR POC: 2.7.  Cuvette Lot#: [id number].  Exp: 06/2011.    Anticoagulation Management Assessment/Plan:      The patient's current anticoagulation dose is Warfarin sodium 5 mg tabs: take as directed.  The target INR is 2.5-3.5.  The next INR is due 07/05/2010.  Anticoagulation instructions were given to patient.  Results were reviewed/authorized by Bethena Midget, RN, BSN.  He was notified by Bethena Midget, RN, BSN.         Prior Anticoagulation Instructions: INR 3.5 (goal 2.5-3.5)  Take 1 tablet everyday except take 1 1/2 tablets on Wednesdays. Keep vitamin k containing foods consistent each week.  Vitamin K containing foods thicken the blood and decrease INR. Check INR in 1 week.  Current Anticoagulation Instructions: INR 2.7 Continue 5mg  everyday except 7.5mg s on Wednesdays. Recheck in one week.

## 2010-07-11 NOTE — Miscellaneous (Signed)
Summary: Orders Update  Clinical Lists Changes  Orders: Added new Test order of Carotid Duplex (Carotid Duplex) - Signed 

## 2010-07-17 NOTE — Miscellaneous (Signed)
  Clinical Lists Changes  Observations: Added new observation of US CAROTID: Mild hetrogenous plaque in both bulbs. 0-39% bilateral ICA stenosis  f/u 1 year (06/19/2010 8:10)      Carotid Doppler  Procedure date:  06/19/2010  Findings:      Mild hetrogenous plaque in both bulbs. 0-39% bilateral ICA stenosis  f/u 1 year

## 2010-07-17 NOTE — Assessment & Plan Note (Signed)
Summary: eph/ post CVA/wa   Vital Signs:  Patient profile:   77 year old male Height:      66.5 inches Weight:      204 pounds BMI:     32.55 Pulse rate:   60 / minute Pulse rhythm:   regular BP sitting:   112 / 70  (right arm)  Vitals Entered By: Jacquelin Hawking, CMA (July 11, 2010 10:24 AM)  History of Present Illness: [name] is seen in followup of atrial flutter and a recent stroke manifesting as central retinal artery occlusion.  It was decided after not to pursue catheter ablation as the symptoms were minimal and the patient would need long-term anticoagulation notwithstanding. He has a history of bypass surgery and mitral valve repair in 2007.   Thromboembolic risk factors are notable for age-51, hypertension-one, vascular disease-1. and now TIA/stroke-2  This occurred in the context of an INR that was normal 2.09  Left ventricular function has been identified as normal   the patient has some sense of his palpitations but not clearly so.  Current Medications (verified): 1)  Metoprolol Tartrate 25 Mg Tabs (Metoprolol Tartrate) .... 1/2 Once Daily 2)  Simvastatin 40 Mg Tabs (Simvastatin) .... Take One Tablet At Bedtime 3)  Avapro 75 Mg  Tabs (Irbesartan) .Marland Kitchen.. 1 By Mouth Daily 4)  Glucosamine-Chondroitin 500-400 Mg Caps (Glucosamine-Chondroitin) .... Once Daily 5)  Multivitamins  Tabs (Multiple Vitamin) .... Once Daily 6)  Resveratrol 100 Mg Caps (Resveratrol) .... Once Daily 7)  Warfarin Sodium 5 Mg Tabs (Warfarin Sodium) .... Take As Directed  Allergies (verified): 1)  ! Niacin  Past History:  Past Medical History: Last updated: 07/23/2009 Chickenpox Prostate Cancer, per Dr. Isabel Caprice Blood transfusion Colonic polyps, hx of Coronary artery disease, sees Dr. Peter [place] normal stress test 06-2009 Hyperlipidemia Hypertension Urinary incontinence  Physical Exam  General:  The patient was alert and oriented in no acute distress. HEENT Normal.  Neck veins were  flat, carotids were brisk.  Lungs were clear.  Heart sounds were regular without murmurs or gallops.  Abdomen was soft with active bowel sounds. There is no clubbing cyanosis or edema. Skin Warm and dry    Impression & Recommendations:  Problem # 1:  ATRIAL FLUTTER (ICD-427.32) the patient has reverted to sinus rhythm. With his prior stroke now we will shoot for his target INR to be 2.5-3.5. There is no role for aspirin in this situation.  In the event that symptoms recur, we would consider undertaking catheter ablation.    Will plan to see him again in 2 months time  ECG demonstrates sinus rhythm with normal intervals The following medications were removed from the medication list:    Aspirin 81 Mg Tbec (Aspirin) ..... One by mouth every day His updated medication list for this problem includes:    Metoprolol Tartrate 25 Mg Tabs (Metoprolol tartrate) .Marland Kitchen... 1/2 once daily    Warfarin Sodium 5 Mg Tabs (Warfarin sodium) .Marland Kitchen... Take as directed  Orders: EKG w/ Interpretation (93000)  Patient Instructions: 1)  Your physician recommends that you schedule a follow-up appointment in: 3 MONTHS WITH DR Graciela Husbands 2)  Your physician recommends that you continue on your current medications as directed. Please refer to the Current Medication list given to you today.   Orders Added: 1)  EKG w/ Interpretation [93000]

## 2010-07-19 ENCOUNTER — Encounter (INDEPENDENT_AMBULATORY_CARE_PROVIDER_SITE_OTHER): Payer: Medicare Other

## 2010-07-19 ENCOUNTER — Encounter: Payer: Self-pay | Admitting: Cardiology

## 2010-07-19 DIAGNOSIS — Z7901 Long term (current) use of anticoagulants: Secondary | ICD-10-CM

## 2010-07-19 DIAGNOSIS — I4891 Unspecified atrial fibrillation: Secondary | ICD-10-CM

## 2010-07-19 LAB — CONVERTED CEMR LAB: POC INR: 2.2

## 2010-07-25 NOTE — Medication Information (Signed)
Summary: [facility]in Clinic  Anticoagulant Therapy  Managed by: Georgina Pillion, PharmD Referring MD: Linus Orn MD: Daleen Squibb MD, Maisie Fus Indication 1: Atrial Fibrillation Indication 2: central retinal artery occulsion Lab Used: [facility] [place] Site: [address] INR POC 2.2 INR RANGE 2.5-3.5  Dietary changes: no    Health status changes: no    Bleeding/hemorrhagic complications: no    Recent/future hospitalizations: no    Any changes in medication regimen? no    Recent/future dental: no  Any missed doses?: no       Is patient compliant with meds? yes       Allergies: 1)  ! Niacin  Anticoagulation Management History:      Positive risk factors for bleeding include an age of 75 years or older.  The bleeding index is 'intermediate risk'.  Positive CHADS2 values include History of HTN.  Negative CHADS2 values include Age > 109 years old.  His last INR was 3.5.  Anticoagulation responsible provider: Daleen Squibb MD, Maisie Fus.  INR POC: 2.2.  Cuvette Lot#: [id number].  Exp: 06/2011.    Anticoagulation Management Assessment/Plan:      The patient's current anticoagulation dose is Warfarin sodium 5 mg tabs: take as directed.  The target INR is 2.5-3.5.  The next INR is due 08/02/2010.  Anticoagulation instructions were given to patient.  Results were reviewed/authorized by Georgina Pillion, PharmD.  He was notified by Georgina Pillion PharmD.         Prior Anticoagulation Instructions: INR 2.5  Today take 7.5mg  then Take 1 tablet everyday except 1.5 tablets on Wednesdays.   Recheck in 2 weeks.  Current Anticoagulation Instructions: Take an extra 1/2 tablet today to equal total dose of 1 1/2 tablets (7.5 mg) then change regimen to take 1 tablet daily (5 mg) except for 1 1/2 tablets (7.5 mg) on Tuesdays and Saturdays only.  INR 2.2 (goal of 2.5-3.5)

## 2010-07-31 NOTE — Progress Notes (Signed)
Summary: Office Visit  Office Visit   Imported By: Earl Many 07/22/2010 12:17:40  _____________________________________________________________________  External Attachment:    Type:   Image     Comment:   External Document

## 2010-08-01 DIAGNOSIS — I4892 Unspecified atrial flutter: Secondary | ICD-10-CM

## 2010-08-01 DIAGNOSIS — I639 Cerebral infarction, unspecified: Secondary | ICD-10-CM

## 2010-08-02 ENCOUNTER — Encounter (INDEPENDENT_AMBULATORY_CARE_PROVIDER_SITE_OTHER): Payer: Medicare Other

## 2010-08-02 ENCOUNTER — Encounter: Payer: Self-pay | Admitting: Internal Medicine

## 2010-08-02 DIAGNOSIS — Z7901 Long term (current) use of anticoagulants: Secondary | ICD-10-CM

## 2010-08-02 DIAGNOSIS — I4891 Unspecified atrial fibrillation: Secondary | ICD-10-CM

## 2010-08-02 LAB — CONVERTED CEMR LAB: POC INR: 1.8

## 2010-08-06 NOTE — Medication Information (Signed)
Summary: rov/ejm  Anticoagulant Therapy  Managed by: Georgina Pillion, PharmD Referring MD: Linus Orn MD: Tenny Craw MD, Gunnar Fusi Indication 1: Atrial Fibrillation Indication 2: central retinal artery occulsion Lab Used: [facility] [place] Site: [address] INR POC 1.8 INR RANGE 2.5-3.5  Dietary changes: no    Health status changes: no    Bleeding/hemorrhagic complications: no    Recent/future hospitalizations: no    Any changes in medication regimen? no    Recent/future dental: no  Any missed doses?: no       Is patient compliant with meds? yes       Allergies: 1)  ! Niacin  Anticoagulation Management History:      Positive risk factors for bleeding include an age of 53 years or older.  The bleeding index is 'intermediate risk'.  Positive CHADS2 values include History of HTN.  Negative CHADS2 values include Age > 63 years old.  His last INR was 3.5.  Anticoagulation responsible provider: Tenny Craw MD, Gunnar Fusi.  INR POC: 1.8.  Cuvette Lot#: [id number].  Exp: 06/2011.    Anticoagulation Management Assessment/Plan:      The patient's current anticoagulation dose is Warfarin sodium 5 mg tabs: take as directed.  The target INR is 2.5-3.5.  The next INR is due 08/16/2010.  Anticoagulation instructions were given to patient.  Results were reviewed/authorized by Georgina Pillion, PharmD.  He was notified by Georgina Pillion PharmD.         Prior Anticoagulation Instructions: Take an extra 1/2 tablet today to equal total dose of 1 1/2 tablets (7.5 mg) then change regimen to take 1 tablet daily (5 mg) except for 1 1/2 tablets (7.5 mg) on Tuesdays and Saturdays only.  INR 2.2 (goal of 2.5-3.5)  Current Anticoagulation Instructions: Take 1 extra tablet today and 2 tablets tomorrow then change regimen to take 1 tablet daily EXCEPT for 1 1/2 tablets on Tuesdays, Thursdays, and Saturdays.  INR 1.8

## 2010-08-07 ENCOUNTER — Encounter: Payer: Self-pay | Admitting: Internal Medicine

## 2010-08-16 ENCOUNTER — Encounter (INDEPENDENT_AMBULATORY_CARE_PROVIDER_SITE_OTHER): Payer: Medicare Other

## 2010-08-16 ENCOUNTER — Encounter: Payer: Self-pay | Admitting: Cardiology

## 2010-08-16 DIAGNOSIS — Z7901 Long term (current) use of anticoagulants: Secondary | ICD-10-CM

## 2010-08-16 DIAGNOSIS — I4891 Unspecified atrial fibrillation: Secondary | ICD-10-CM

## 2010-08-16 LAB — CONVERTED CEMR LAB: POC INR: 2.4

## 2010-08-19 LAB — CBC
HCT: 43.9 % (ref 39.0–52.0)
Hemoglobin: 15.1 g/dL (ref 13.0–17.0)
MCH: 30.5 pg (ref 26.0–34.0)
MCHC: 34.4 g/dL (ref 30.0–36.0)
MCV: 88.7 fL (ref 78.0–100.0)
Platelets: 158 10*3/uL (ref 150–400)
RBC: 4.95 MIL/uL (ref 4.22–5.81)
RDW: 13 % (ref 11.5–15.5)
WBC: 6.2 10*3/uL (ref 4.0–10.5)

## 2010-08-19 LAB — BASIC METABOLIC PANEL
BUN: 14 mg/dL (ref 6–23)
CO2: 28 mEq/L (ref 19–32)
Calcium: 9.4 mg/dL (ref 8.4–10.5)
Chloride: 104 mEq/L (ref 96–112)
Creatinine, Ser: 1.1 mg/dL (ref 0.4–1.5)
GFR calc Af Amer: >60 mL/min (ref >60)
GFR calc non Af Amer: >60 mL/min (ref >60)
Glucose, Bld: 98 mg/dL (ref 70–99)
Potassium: 4.1 mEq/L (ref 3.5–5.1)
Sodium: 140 mEq/L (ref 135–145)

## 2010-08-19 LAB — PROTIME-INR
INR: 2.81 — ABNORMAL HIGH (ref 0.00–1.49)
Prothrombin Time: 29.7 s — ABNORMAL HIGH (ref 11.6–15.2)

## 2010-08-19 LAB — APTT: aPTT: 45 seconds — ABNORMAL HIGH (ref 24–37)

## 2010-08-20 NOTE — Medication Information (Signed)
Summary: RX Refill Authorization Request  RX Refill Authorization Request   Imported By: Earl Many 08/16/2010 13:18:54  _____________________________________________________________________  External Attachment:    Type:   Image     Comment:   External Document

## 2010-08-20 NOTE — Medication Information (Signed)
Summary: rov/pc  Anticoagulant Therapy  Managed by: Cloyde Reams, RN, BSN Referring MD: Linus Orn MD: Riley Kill MD, Maisie Fus Indication 1: Atrial Fibrillation Indication 2: central retinal artery occulsion Lab Used: [facility] [place] Site: [address] INR POC 2.4 INR RANGE 2.5-3.5  Dietary changes: no    Health status changes: no    Bleeding/hemorrhagic complications: no    Recent/future hospitalizations: no    Any changes in medication regimen? no    Recent/future dental: no  Any missed doses?: no       Is patient compliant with meds? yes       Allergies: 1)  ! Niacin  Anticoagulation Management History:      The patient is taking warfarin and comes in today for a routine follow up visit.  Positive risk factors for bleeding include an age of 45 years or older.  The bleeding index is 'intermediate risk'.  Positive CHADS2 values include History of HTN.  Negative CHADS2 values include Age > 77 years old.  His last INR was 3.5.  Anticoagulation responsible provider: Riley Kill MD, Maisie Fus.  INR POC: 2.4.  Cuvette Lot#: [id number].  Exp: 08/2011.    Anticoagulation Management Assessment/Plan:      The patient's current anticoagulation dose is Warfarin sodium 5 mg tabs: Take as directed by coumadin clinic..  The target INR is 2.5-3.5.  The next INR is due 09/13/2010.  Anticoagulation instructions were given to patient.  Results were reviewed/authorized by Cloyde Reams, RN, BSN.  He was notified by Cloyde Reams RN.         Prior Anticoagulation Instructions: Take 1 extra tablet today and 2 tablets tomorrow then change regimen to take 1 tablet daily EXCEPT for 1 1/2 tablets on Tuesdays, Thursdays, and Saturdays.  INR 1.8  Current Anticoagulation Instructions: INR 2.4  Take an extra 1/2 tablet today, then resume same dosage 1 tablet daily except 1.5 tablets on Tuesdays, Thursdays, and Saturdays.  Recheck in 4 weeks.   Prescriptions: WARFARIN SODIUM 5 MG  TABS (WARFARIN SODIUM) Take as directed by coumadin clinic.  #45 x 2   Entered by:   Cloyde Reams RN   Authorized by:   Nathen May, MD, [facility]   Signed by:   Cloyde Reams RN on 08/16/2010   Method used:   Faxed to ...       [facility] (retail)       [address]       [address]       [place], [place]  [address]  [place]       Ph: [phone]       Fax: [phone]   RxID:   [id number]

## 2010-09-06 ENCOUNTER — Encounter: Payer: Self-pay | Admitting: Family Medicine

## 2010-09-12 ENCOUNTER — Encounter: Payer: Medicare Other | Admitting: *Deleted

## 2010-09-12 LAB — POCT URINALYSIS DIP (DEVICE)
Bilirubin Urine: NEGATIVE
Glucose, UA: NEGATIVE mg/dL
Hgb urine dipstick: NEGATIVE
Nitrite: NEGATIVE

## 2010-09-23 ENCOUNTER — Ambulatory Visit (INDEPENDENT_AMBULATORY_CARE_PROVIDER_SITE_OTHER): Payer: Medicare Other | Admitting: Family Medicine

## 2010-09-23 ENCOUNTER — Encounter: Payer: Self-pay | Admitting: Family Medicine

## 2010-09-23 VITALS — BP 120/80 | HR 81 | Temp 98.4°F | Wt 210.0 lb

## 2010-09-23 DIAGNOSIS — K635 Polyp of colon: Secondary | ICD-10-CM

## 2010-09-23 DIAGNOSIS — L570 Actinic keratosis: Secondary | ICD-10-CM

## 2010-09-23 DIAGNOSIS — C61 Malignant neoplasm of prostate: Secondary | ICD-10-CM

## 2010-09-23 DIAGNOSIS — Z8546 Personal history of malignant neoplasm of prostate: Secondary | ICD-10-CM

## 2010-09-23 LAB — PSA: PSA: 0 ng/mL — ABNORMAL LOW (ref 0.10–4.00)

## 2010-09-23 NOTE — Progress Notes (Signed)
  Subjective:    Patient ID: [name], male    DOB: Feb 14, 1934, 77 y.o.   MRN: [id number]  HPI Here for several issues. First he asks to have a PSA drawn since he has had a prostatectomy. He is due to see Dr. Isabel Caprice later this year. Second, he asks me to check some moles on his back and head. He does wear hats but has had a lot of sun exposure in his life. Third, he would like to set up a colonoscopy since he has a hx of polyps.    Review of Systems  Constitutional: Negative.   Respiratory: Negative.   Cardiovascular: Negative.        Objective:   Physical Exam  Cardiovascular: Normal rate, normal heart sounds and intact distal pulses.  An irregularly irregular rhythm present. Exam reveals no gallop and no friction rub.   No murmur heard. Pulmonary/Chest: Effort normal and breath sounds normal.  Skin:       He has an actinic keratosis with other scattered actinic damage on the vertex of the scalp. He has numerous seborrheic keratoses over the trunk.           Assessment & Plan:  We will refer him for a colonoscopy and to see Dermatology. Get a PSA today.

## 2010-09-24 ENCOUNTER — Telehealth: Payer: Self-pay

## 2010-09-24 NOTE — Telephone Encounter (Signed)
Pt aware.

## 2010-09-24 NOTE — Telephone Encounter (Signed)
Message copied by Madison Hickman on Tue Sep 24, 2010  8:01 AM ------      Message from: Dwaine Deter      Created: Tue Sep 24, 2010  5:39 AM       PSA is 0.00 as expected

## 2010-09-25 ENCOUNTER — Encounter: Payer: Self-pay | Admitting: Internal Medicine

## 2010-09-25 ENCOUNTER — Ambulatory Visit (INDEPENDENT_AMBULATORY_CARE_PROVIDER_SITE_OTHER): Payer: Medicare Other | Admitting: Internal Medicine

## 2010-09-25 ENCOUNTER — Ambulatory Visit (INDEPENDENT_AMBULATORY_CARE_PROVIDER_SITE_OTHER): Payer: Medicare Other | Admitting: Cardiovascular Disease

## 2010-09-25 DIAGNOSIS — I10 Essential (primary) hypertension: Secondary | ICD-10-CM

## 2010-09-25 DIAGNOSIS — I6350 Cerebral infarction due to unspecified occlusion or stenosis of unspecified cerebral artery: Secondary | ICD-10-CM

## 2010-09-25 DIAGNOSIS — I4892 Unspecified atrial flutter: Secondary | ICD-10-CM

## 2010-09-25 DIAGNOSIS — I639 Cerebral infarction, unspecified: Secondary | ICD-10-CM

## 2010-09-25 DIAGNOSIS — I2510 Atherosclerotic heart disease of native coronary artery without angina pectoris: Secondary | ICD-10-CM

## 2010-09-25 LAB — POCT INR: INR: 2.2

## 2010-09-25 MED ORDER — LISINOPRIL 20 MG PO TABS: 20.0000 mg | ORAL_TABLET | Freq: Every day | ORAL | Status: DC

## 2010-09-25 NOTE — Assessment & Plan Note (Signed)
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 He will need to be maintained on Coumadin lifelong. He will need bridging at the time of his colonoscopy.

## 2010-09-25 NOTE — Assessment & Plan Note (Signed)
Given the cost of Avapro we'll try him on an ACE inhibitor. We'll start him on lisinopril 20 we will review the side effects

## 2010-09-25 NOTE — Assessment & Plan Note (Signed)
No known recurrent arrhythmia; we'll continue him on anticoagulation because of his prior stroke

## 2010-09-25 NOTE — Patient Instructions (Addendum)
Your physician recommends that you schedule a follow-up appointment in: 6 MONTHS WITH DR Graciela Husbands  Your physician has recommended you make the following change in your medication: STOP AVAPRO AND START LISINOPRIL 20 MG 1 QD

## 2010-09-25 NOTE — Assessment & Plan Note (Signed)
Stable and will continue current medications

## 2010-09-25 NOTE — Progress Notes (Signed)
  HPI  [name] is a 77 y.o. male  seen in followup of atrial flutter and a recent stroke manifesting as central retinal artery occlusion.  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  The patient denies chest pain, shortness of breath, nocturnal dyspnea, orthopnea or peripheral edema.  There have been no palpitations, lightheadedness or syncope.       Past Medical History  Diagnosis Date  . Chickenpox   . Prostate cancer   . Blood transfusion abn reaction or complication, no procedure mishap   . History of colonic polyps   . Coronary artery disease   . Normal cardiac stress test   . Hyperlipidemia   . Hypertension   . Urinary incontinence     Past Surgical History  Procedure Date  . Heart bypass   . Repeat heart bypass 2007    and mitral valve replacement   . Bilateral inguinal hernia repairs 1993  . Radical prostatectomy with radiation   . Ablation for atrial fibrillation   . Installation of blader pump 2011    dr. McDiarmid    Current Outpatient Prescriptions  Medication Sig Dispense Refill  . glucosamine-chondroitin 500-400 MG tablet Take 1 tablet by mouth daily.        . irbesartan (AVAPRO) 75 MG tablet Take 75 mg by mouth daily.        . metoprolol tartrate (LOPRESSOR) 25 MG tablet Take by mouth. Take 1/2 tab by mouth once daily        . Multiple Vitamin (MULTIVITAMIN) capsule Take 1 capsule by mouth daily.        Marland Kitchen RESVERATROL 100 MG CAPS Take by mouth daily.        . simvastatin (ZOCOR) 40 MG tablet Take 40 mg by mouth at bedtime.        Marland Kitchen warfarin (COUMADIN) 5 MG tablet Take by mouth as directed.          Allergies  Allergen Reactions  . Niacin     Review of Systems negative except from HPI and PMH  Physical Exam Well developed and well nourished  in no acute distress HENT normal E scleral and icterus clear Neck Supple JVP flat; carotids brisk and full Clear to ausculation Regular rate and rhythm, no murmurs gallops or rub Soft with active bowel sounds No clubbing cyanosis and edema Alert and oriented, grossly normal motor and sensory function Skin Warm and Dry  Assessment and  Plan

## 2010-10-08 ENCOUNTER — Ambulatory Visit (INDEPENDENT_AMBULATORY_CARE_PROVIDER_SITE_OTHER): Payer: Medicare Other | Admitting: *Deleted

## 2010-10-08 DIAGNOSIS — I6350 Cerebral infarction due to unspecified occlusion or stenosis of unspecified cerebral artery: Secondary | ICD-10-CM

## 2010-10-08 DIAGNOSIS — I4892 Unspecified atrial flutter: Secondary | ICD-10-CM

## 2010-10-08 DIAGNOSIS — I639 Cerebral infarction, unspecified: Secondary | ICD-10-CM

## 2010-10-08 LAB — POCT INR: INR: 2.4

## 2010-10-09 ENCOUNTER — Encounter: Payer: Medicare Other | Admitting: *Deleted

## 2010-10-22 ENCOUNTER — Ambulatory Visit (INDEPENDENT_AMBULATORY_CARE_PROVIDER_SITE_OTHER): Payer: Medicare Other | Admitting: *Deleted

## 2010-10-22 DIAGNOSIS — I639 Cerebral infarction, unspecified: Secondary | ICD-10-CM

## 2010-10-22 DIAGNOSIS — I6350 Cerebral infarction due to unspecified occlusion or stenosis of unspecified cerebral artery: Secondary | ICD-10-CM

## 2010-10-22 DIAGNOSIS — I4892 Unspecified atrial flutter: Secondary | ICD-10-CM

## 2010-10-22 NOTE — Op Note (Signed)
[name], [name]               ACCOUNT NO.:  [id number]   MEDICAL RECORD NO.:  [id number]          PATIENT TYPE:  OIB   LOCATION:  0098                         FACILITY[facility]  [facility]   PHYSICIAN:  Martina Sinner, MD DATE OF BIRTH:  1933-11-27   DATE OF PROCEDURE:  09/06/2007  DATE OF DISCHARGE:                               OPERATIVE REPORT   PREOPERATIVE DIAGNOSIS:  Stress urinary incontinence.   POSTOPERATIVE DIAGNOSIS:  Stress urinary incontinence.   PROCEDURE:  Insertion of artificial urinary sphincter plus cystoscopy.   SURGEON:  Martina Sinner, M.D.   ASSISTANT:  Robley Fries.   [name] has had radiation.  He has had an infected eroded artificial  sphincter removed 3 months ago.  He is totally urinary incontinent.   The patient was prepped and draped in the usual fashion.  Preoperative  lab tests were normal.  A 10 minute scrub and prep was utilized.  He was  given preoperative Unasyn and gentamicin.   Extra care was taken to position his legs to minimize the risk of  compartment syndrome, neuropathy and DVT.   I made a 6-cm incision in the midline of the perineum and carried it  down to the thin bulbous spongiosis muscle.  He surprisingly had a very  deep perineum that did not bulge out nicely even after I mobilized the  fat from the bulbous spongiosis muscle.  I split the bulbous spongiosis  muscle in the midline.  It was very thin and attenuated and somewhat  stuck to the bulb.  I dissected it off the bulbar urethra.  There was no  question I was at the bifurcation of the corporal body.  I could see the  area of probable previous erosion more distal.  I initially had  cystoscoped the patient, and I felt that there may have been a little  bit of pale urethra in the mid to distal bulbar urethra.   I did a box technique, dissecting sharply through Buck's fascia at the  mid corporal body.  I extended this cephalad along the bifurcation as  well as distal towards  the urethral meatus.  The urethra was initially  pulled upward cephalad and was very difficult to pinch.  After my  dissection, I was able to pull it off the patient with approximately a 1-  cm sail of soft tissue.  I perforated the sail of tissue with my  Metzenbaums using my usual technique and passed a Vesseloops.  I then  made the hole larger.  I tried to stay as proximal as possible without  getting into the urethra.   Because I was staying so proximal, I recystoscoped the patient after  this maneuver, and there is no injury to the urethra.  I was happy with  the position and size of the opening around the dorsal aspect of the  urethra and sail of tissue.   I then made the right lower quadrant incision approximately 7 cm in  length, and I used all my marking points.  He had a lot of fat in this  area, and I  had to use a right angle Heaney retractors to get down to  his external oblique aponeurosis which was split along its fibers.  I  used a large Kelly to split through the muscle, and I sharply broke  through the transversalis fascia into the preperitoneal space.  A  prepared empty reservoir was inserted followed by 25 mL of normal  saline.   I carefully then closed the fascia with running 0 Vicryl on a CT-1  needle.  I was happy with the closure.   He had a lot of fat, and I used a right-angle retractor to hand dissect  at the level of the fascia down to the high hemiscrotum.  I delivered  the scrotal skin on the right side and did my usual dissection with a  Ray-Tec and peanut to deliver the tip of my finger.  I circumferentially  made a subdartos pouch.  I passed the pump with a ring forceps on top my  finger down to the low right hemiscrotum in a nice dependent position.  A Babcock with guard was utilized to hold in place.   I then placed a 4-cm cuff around the urethra which was not bleeding.  It  laid in very nicely.  I attached it and brought its tubing up through  the  right lower quadrant incision with the tubing passer.   We then used quick connects and lots of irrigation to connect all  tubings appropriately.  Incisions were irrigated.   The subcutaneous tissue was closed with 3-0 Vicryl followed by 4-0  subcuticular suture.   Three layers of 3-0 Vicryl and 4-0 subcuticular was used for the  perineum.  Dermabond was used for both incisions.  Fluff dressing was  used for postoperative hemostasis with mesh pants.   I cycled the device multiple times during the procedure.  It cycled and  even activated and deactivated very well.  Having said that, I thought  the pump felt a little bit spongy. I thought after cycling it several  times, it was within normal limits.  For this reason, I cystoscoped the  patient.  There was excellent coaptation of the urethra.  In my opinion,  for there to be excellent coaptation, the device must be cycling well,  sending fluid beyond the full pump up into the cuff.  We were very  careful with the procedure, and I did not feel that I would have lost  any fluid.  For this reason, I did not open incision and check the  reservoir for its total volume.   Leg position was good at the end of the case.  Foley catheter was  draining clear urine.  The patient was taken to the recovery room.  Hopefully this operation will reach his treatment goal.   There is no question that [name]'s urethra showed signs of radiation  and was quite atrophied.  The tissue around the urethra was atrophy.  His urethra was quite small, but cystoscopically, the coaptation looked  very good.  I am hoping that he will get a good result like he did  before and that it does not result in infection and/or erosion.           ______________________________  Martina Sinner, MD  Electronically Signed     SAM/MEDQ  D:  09/06/2007  T:  09/06/2007  Job:  [id number]

## 2010-10-22 NOTE — Op Note (Signed)
[name], [name]               ACCOUNT NO.:  [id number]   MEDICAL RECORD NO.:  [id number]          PATIENT TYPE:  INP   LOCATION:  1440                         FACILITY:  [facility]   PHYSICIAN:  Valetta Fuller, M.D.  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   POSTOPERATIVE DIAGNOSIS:  1. Infection of three-piece AMS 800 urinary sphincter device with      possible erosion.  2. Urethral erosion confirmed.   PROCEDURE PERFORMED:  Cystoscopy with explantation of multicomponent AMS  800 urinary sphincter device.   SURGEON:  Valetta Fuller, M.D.   ASSISTANT:  Martina Sinner, MD   ANESTHESIA:  General endotracheal.   INDICATIONS:  [name] is a 77 year old male.  He was diagnosed with  prostate cancer and underwent definitive [place].  The patient subsequently  received post surgical radiotherapy and his PSA has a remained  undetectable now for several years.  The patient had severe stress  incontinence status post that surgery and had placement of an AMS 800  urinary sphincter.  That device generally worked well for him but he  noticed that after catheterization after heart surgery that he began  having some increased urinary hesitancy and more difficulty completely  empty his bladder.  He came to see me and established with our practice  several months ago.  He was confirmed to have negative PSA at that time.  The patient underwent some additional evaluation.  He had multiple  urinalyses which were crystal clear and cystoscopy showed no evidence of  erosion.  Clinical exam was also unremarkable.  The patient subsequently  called recently complaining of increased difficulty emptying his  bladder.  He was given  appointment to see Dr. Sherron Monday since he is our  sphincter specialist.  In the interim the patient's symptoms worsened  and he had to present to the emergency room in urinary retention.  He  was seen and subsequently admitted by my partner, Dr. Wanda Plump with  concern that he had an infected prosthesis based on clinical exam.  He  also had a little bit of purulent material coming from his urethra and  was frankly in urinary retention.  He was put on broad-spectrum  antibiotic therapy.  On clinical exam the next day my assessment  concurred that he did have what appeared to be an infected prosthesis.  He had warmth, induration and mild tenderness around the pump mechanism.  His white blood cell count was normal and he was afebrile.  I asked Dr.  Sherron Monday to also assess the patient and his consultation was also  performed.  It was his opinion that the prosthesis was infected, should  come out and that a salvage should not be attempted.  We discussed this  at length with [name].  He appeared to understand the issues here.  He was told that he  would be potentially a candidate for repeat  implantation of a sphincter at a later date.  We talked about the issues  with regard to erosion and we would probably determine that at the time  of surgery.  That would further delay his reimplantation.  He again  appeared to understand the advantages and disadvantages, potential  complications and full informed consent was obtained.   TECHNIQUE AND FINDINGS:  The patient was brought to the operating room  where he had successful induction of general endotracheal anesthesia.  He was placed in moderate to high lithotomy position and carefully  prepped and draped in the usual manner.  His Foley catheter was removed.  Cystoscopy was performed initially.  The patient had quite a bit of  inflammation, edema and oozing from the cuff site but clear-cut erosion  could not be demonstrated at that time.  A new  14-French Foley catheter  was then placed.  We were able to contact the patient's previous  urologist to obtain operative reports but the exact incisions were not  entirely clear based on the operative report.  The patient clearly had a  transscrotal incision which was then opened.  Some tubing was located  and then followed down to the more proximal urethra where the cuff was  identified.  Utilizing primarily electrocautery, tissue overlying the  cuff was incised.  The tab was then identified and the cuff was removed  circumferentially from around the urethra.  At that point we did a  periurethral squirt with some methylene blue and indeed an opening with  erosion of the urethra was identified which was relatively small.  The  tubing was then followed into the scrotum to remove the whole mechanism.  Once we entered the pump compartment, some cloudy fluid was obtained.  This was not frank pus but it was cloudy fluid consistent with at the  very least colonization and what appeared to be at least some degree of  infection around the pump component.  The pump was removed from the  scrotum and a smaller incision was made in the dependent portion of the  scrotum for later placement of a Penrose drain.  Copious irrigation with  saline and then triple antibiotic solution was also performed for  approximately 2-3 minutes.  The final piece of tubing was identified  going up to the reservoir.  This was followed up until the tubing began  to dive underneath the pubic symphysis.  At that point it was fairly  clear that this reservoir was completely locked in to some heavy dense  fibrotic material from the previous surgery and radiation therapy.  We  did not feel we could safely remove the reservoir without doing an open  more extensive exploratory laparotomy through a retropubic incision.  There was really no evidence of any infection tracking up the reservoir  and after decision making process, Dr.  Sherron Monday and I felt the best  thing to do was to sever the tubing as close to the reservoir as  possible but to actually leave the reservoir in place with removal at  later date if clinical infection set in.  We just did not feel that we  could remove that safely, given history of radiation with the potential  for bowel underneath that location.  At the completion of the procedure  again additional irrigation was performed.  We elected to leave a 14-  [place] catheter in place.  A Penrose drain was again placed through the  separate stab incision in dependent portion of the scrotum.  The skin  edges were reapproximated loosely with Vicryl suture.  Dressings were  placed.  The patient appeared to tolerate the procedure well and there  were no obvious complications or difficulties.           ______________________________  Valetta Fuller, M.D.  Electronically Signed     DSG/MEDQ  D:  05/26/2007  T:  05/27/2007  Job:  [id number]

## 2010-10-25 NOTE — Discharge Summary (Signed)
[name], [name]               ACCOUNT NO.:  [id number]   MEDICAL RECORD NO.:  [id number]          PATIENT TYPE:  INP   LOCATION:  1440                         FACILITY:  [facility]   PHYSICIAN:  Valetta Fuller, M.D.  DATE OF BIRTH:  1933/12/28   DATE OF ADMISSION:  05/23/2007  DATE OF DISCHARGE:  05/27/2007                               DISCHARGE SUMMARY   DISCHARGE DIAGNOSIS:  1. Infection and erosion of artificial urinary device and (AMS-800      artificial urinary sphincter).  2. History of adenocarcinoma of the prostate.  3. History of severe stress incontinence.  4. Coronary artery disease.   PROCEDURE PERFORMED:  On May 15, 2007 and was cystoscopy with  explantation of multicomponent AMS-800 artificial urinary sphincter.   HOSPITAL COURSE:  [name] is a 77 year old male with a prior history  of prostate cancer status post radical retropubic prostatectomy.  Done  out of this service area.  The patient also received postoperative  adjuvant radiation therapy.  He came to see me for continuity of care  after moving to the [place] area.  Because of severe stress  incontinence, he had had implantation of an AMS-800 urinary sphincter  by, again, outside urologist.  When we saw him his PSA was essentially  0, and there was no evidence of active prostate cancer.  He had had some  increased difficulty emptying his bladder completely, and felt that  there had been increased weakening of his urinary stream.  The patient  had undergone a clinical assessment which revealed clear urine, and no  evidence of erosion or malfunction of the sphincter at that time.  The  patient subsequently developed some increased difficulty with voiding.  He was seen, on call, by Dr. Cindie Laroche; and was noted to have some  purulent material coming from his urethra.  He was found to be in acute  urinary retention.  Dr. Humphrey's assessment was that he had a probable  infection which was a new  problem with his AMS-800 sphincter, and that  there might be an erosion.  He was admitted to the hospital, and placed  on broad-spectrum antibiotic therapy.  The patient was continued on  broad-spectrum antibiotic therapy.  He remained afebrile, and had a  normal white blood cell count.  I initially saw him on May 25, 2007.   We felt that the patient should have an explantation of his sphincter.  That surgery was done.  He was noted to have some cloudy fluid around  the pump mechanism.  His reservoir was actually left under some scar  tissue in the retropubic space, because we felt that to remove that  would require additional procedure, and hopefully would not be  necessary.  The cuff and pump mechanisms; however, were removed.   Assessment with retrograde injection of dye did reveal evidence of  urethral erosion.  This procedure was done by myself, along with Dr.  Jonna Coup MacDiarmid.  The patient's recovery was otherwise uneventful.  The cultures that were obtained from the wound actually showed no  growth; but the patient, again,  had been on broad-spectrum antibiotic  therapy.  Blood work, otherwise, remained unremarkable.  At the  completion of the procedure, a catheter had been placed, and it was felt  that he would require at least 3-4 weeks of catheter drainage.  The  patient was discharged home on May 27, 2007.  He was continued on  his current medications.  He was also sent home  with some Vicodin for pain, and clindamycin as a broad-spectrum  antibiotic.  He will follow up in our office in a couple of weeks for  reassessment, and eventually will need a pericatheter retrograde  urethrogram to make sure that the urethra has recovered.           ______________________________  Valetta Fuller, M.D.  Electronically Signed     DSG/MEDQ  D:  06/21/2007  T:  06/21/2007  Job:  [id number]   cc:   Deirdre Peer. Polite, M.D.

## 2010-11-05 ENCOUNTER — Encounter: Payer: Self-pay | Admitting: Internal Medicine

## 2010-11-05 ENCOUNTER — Ambulatory Visit (INDEPENDENT_AMBULATORY_CARE_PROVIDER_SITE_OTHER): Payer: Medicare Other | Admitting: Internal Medicine

## 2010-11-05 VITALS — BP 120/68 | HR 64 | Ht 67.0 in | Wt 209.2 lb

## 2010-11-05 DIAGNOSIS — Z7901 Long term (current) use of anticoagulants: Secondary | ICD-10-CM | POA: Insufficient documentation

## 2010-11-05 DIAGNOSIS — Z86010 Personal history of colonic polyps: Secondary | ICD-10-CM

## 2010-11-05 DIAGNOSIS — I4892 Unspecified atrial flutter: Secondary | ICD-10-CM

## 2010-11-05 NOTE — Patient Instructions (Signed)
Colonoscopy has been delayed. We will obtain your records from Dr. Esmond Harps. If you haven't heard from [place] by July 1 please give [place] a call back.

## 2010-11-05 NOTE — Assessment & Plan Note (Addendum)
The patient reports having precancerous, presumably adenomatous polyps removed in 2000 and 2006 in [place]. We have identified performing physician there and have requested the records review of the colonoscopy and pathology reports. Once I see those we will make a recommendation as to whether he is appropriate for colonoscopy at this time. Given the warfarin use and any further history as well as the stroke I would lean towards continuing anticoagulation versus a Lovenox window.  Records obtained and reviewed: 04/25/98 colonoscopy: 2 tubulovillous adenomas, 3 mm and 1 cm and one polyp removed but not recovered 01/07/2001 colonoscopy: tubulovillous adenomas + tubular adenoma x 3 at least some destroyed, some hyperplastic, largest 8 mm and 5 total polyps 08/01/2003 3 polyps largest 6 mm, tubular adenomas   So it is reasonable and appropriate to repeat a routine colonoscopy either on warfarin or with Lovenox window - Dr. Graciela Husbands has indicated need for bridging.

## 2010-11-05 NOTE — Progress Notes (Signed)
  Subjective:    Patient ID: [name], male    DOB: July 09, 1933, 77 y.o.   MRN: [id number]  HPI 77 year old white man with history of colon polyps, removed previously in [place]. He thinks it was around 2000 and 2006. He moved here within the last couple of years and is sent by his primary care physician to see about having a repeat routine colonoscopy. He has no active GI symptoms at this time. He did have a stroke last year related to his atrial flutter. He is on warfarin and this is coordinated by Dr. Alberteen Spindle at this time.   Review of Systems     Objective:   Physical Exam Well-developed well-nourished, overweight elderly white male. Lungs clear Heart S1-S2 no rubs or gallops, he has a normal rhythm and rate. Abdomen is obese He is awake alert and oriented x3       Assessment & Plan:

## 2010-11-05 NOTE — Assessment & Plan Note (Signed)
He is on warfarin for this and has a history of a stroke with resultant blindness that we think is related to this problem. Given that I would either continue warfarin or I think use a Lovenox window that we could consult with cardiology, prior to any colonoscopy. I'm awaiting records review before recommending timing of his repeat colonoscopy for what sounds like he is an appropriate candidate for that from what we do know.

## 2010-11-05 NOTE — Assessment & Plan Note (Signed)
This is for stroke reduction in the setting of atrial flutter fibrillation

## 2010-11-18 ENCOUNTER — Encounter: Payer: Self-pay | Admitting: Internal Medicine

## 2010-11-18 ENCOUNTER — Telehealth: Payer: Self-pay | Admitting: *Deleted

## 2010-11-18 NOTE — Telephone Encounter (Signed)
Message copied by Daphine Deutscher on Mon Nov 18, 2010 10:54 AM ------      Message from: Stan Head E      Created: Mon Nov 18, 2010  8:45 AM       Please see ammended office note that I sent            He needs to arrange a colonoscopy (screening and hx colon polyps) but will need a Lovenox bridge as per Dr. Odessa Fleming recommendations      Please arrange this using Coumadin clinic and let patient know

## 2010-11-18 NOTE — Telephone Encounter (Signed)
I have left message for pt to call back to arrange colonoscopy he is to see the coumadin clinic on 11/21/2010.

## 2010-11-18 NOTE — Telephone Encounter (Signed)
Pt called back his nurse visit is for 12/09/2010 and his colonoscopy date is 12/17/2010, I will forward this note to the coumadin clinic so they can discuss bridging with him at his 11/21/2010 visit. I will check back to make sure this has been done with pt.

## 2010-11-19 ENCOUNTER — Encounter: Payer: Medicare Other | Admitting: *Deleted

## 2010-11-21 ENCOUNTER — Ambulatory Visit (INDEPENDENT_AMBULATORY_CARE_PROVIDER_SITE_OTHER): Payer: Medicare Other | Admitting: *Deleted

## 2010-11-21 DIAGNOSIS — I4892 Unspecified atrial flutter: Secondary | ICD-10-CM

## 2010-11-21 DIAGNOSIS — I6350 Cerebral infarction due to unspecified occlusion or stenosis of unspecified cerebral artery: Secondary | ICD-10-CM

## 2010-11-21 DIAGNOSIS — I639 Cerebral infarction, unspecified: Secondary | ICD-10-CM

## 2010-11-21 MED ORDER — ENOXAPARIN SODIUM 150 MG/ML ~~LOC~~ SOLN: 150.0000 mg | Freq: Every day | SUBCUTANEOUS | Status: DC

## 2010-12-09 ENCOUNTER — Other Ambulatory Visit: Payer: Self-pay | Admitting: *Deleted

## 2010-12-09 ENCOUNTER — Encounter: Payer: Medicare Other | Admitting: *Deleted

## 2010-12-09 ENCOUNTER — Ambulatory Visit (AMBULATORY_SURGERY_CENTER): Payer: Medicare Other | Admitting: *Deleted

## 2010-12-09 ENCOUNTER — Ambulatory Visit (INDEPENDENT_AMBULATORY_CARE_PROVIDER_SITE_OTHER): Payer: Medicare Other | Admitting: *Deleted

## 2010-12-09 VITALS — Ht 67.0 in | Wt 208.9 lb

## 2010-12-09 DIAGNOSIS — I4892 Unspecified atrial flutter: Secondary | ICD-10-CM

## 2010-12-09 DIAGNOSIS — I639 Cerebral infarction, unspecified: Secondary | ICD-10-CM

## 2010-12-09 DIAGNOSIS — Z86010 Personal history of colonic polyps: Secondary | ICD-10-CM

## 2010-12-09 DIAGNOSIS — I6350 Cerebral infarction due to unspecified occlusion or stenosis of unspecified cerebral artery: Secondary | ICD-10-CM

## 2010-12-09 LAB — POCT INR: INR: 2.7

## 2010-12-09 MED ORDER — WARFARIN SODIUM 5 MG PO TABS: ORAL_TABLET | ORAL | Status: DC

## 2010-12-09 MED ORDER — PEG-KCL-NACL-NASULF-NA ASC-C 100 G PO SOLR: ORAL | Status: DC

## 2010-12-09 NOTE — Patient Instructions (Addendum)
7/2- Coumadin 1 tablet 7/3- Coumadin 1 1/2 tablets 7/4- Coumadin 1 tablet  7/5- Coumadin 1 1/2 tablets  7/6- No Coumadin or Lovenox 7/7-Start Lovenox 150mg  injection once in AM  7/8- Lovenox 150mg  injection once in AM 7/9- Lovenox 150mg  injection once in AM 7/10- Day of Procedure.  If okay with MD, restart Coumadin that night with 2 tablets  7/11- Lovenox 150mg  injection in AM AND Coumadin 1 1/2 tablets  7/12- Lovenox 150mg  injection in AM AND Coumadin 1 1/2 tablets 7/13-Lovenox 150mg  injection in AM AND Coumadin 1  tablet 7/14-Lovenox 150mg  injection in AM AND Coumadin 1 1/2 tablets 7/15-Lovenox 150mg  injection in AM AND Coumadin 1 1/2 tablets 7/16- Recheck INR

## 2010-12-17 ENCOUNTER — Ambulatory Visit (AMBULATORY_SURGERY_CENTER): Payer: Medicare Other | Admitting: Internal Medicine

## 2010-12-17 ENCOUNTER — Encounter: Payer: Self-pay | Admitting: Internal Medicine

## 2010-12-17 ENCOUNTER — Other Ambulatory Visit: Payer: Medicare Other | Admitting: Internal Medicine

## 2010-12-17 VITALS — BP 133/65 | HR 71 | Temp 97.8°F | Resp 13 | Ht 67.5 in | Wt 195.0 lb

## 2010-12-17 DIAGNOSIS — Z1211 Encounter for screening for malignant neoplasm of colon: Secondary | ICD-10-CM

## 2010-12-17 DIAGNOSIS — D378 Neoplasm of uncertain behavior of other specified digestive organs: Secondary | ICD-10-CM

## 2010-12-17 DIAGNOSIS — D375 Neoplasm of uncertain behavior of rectum: Secondary | ICD-10-CM

## 2010-12-17 DIAGNOSIS — D371 Neoplasm of uncertain behavior of stomach: Secondary | ICD-10-CM

## 2010-12-17 DIAGNOSIS — Z86010 Personal history of colonic polyps: Secondary | ICD-10-CM

## 2010-12-17 DIAGNOSIS — D126 Benign neoplasm of colon, unspecified: Secondary | ICD-10-CM

## 2010-12-17 MED ORDER — SODIUM CHLORIDE 0.9 % IV SOLN: 500.0000 mL | INTRAVENOUS | Status: DC

## 2010-12-17 NOTE — Patient Instructions (Addendum)
Resume warfarin at twice normal dose tonight as instructed by Coumadin clinic. Resume enoxaparin tomorrow as planned.  Please review all discharge papers given to you by your recovery room nurse.  Dr. Leone Payor will be sending you a letter concerning the polyps and when this exam will need to be repeated.  We will call you in the am to see how you are doing and answer any questions you may have.  Please call [phone] if you have any problems after discharge. Thank you.

## 2010-12-18 ENCOUNTER — Telehealth: Payer: Self-pay | Admitting: *Deleted

## 2010-12-18 NOTE — Telephone Encounter (Signed)
Follow up Call- Patient questions:  Do you have a fever, pain , or abdominal swelling? no Pain Score  0 *  Have you tolerated food without any problems? yes  Have you been able to return to your normal activities? yes  Do you have any questions about your discharge instructions: Diet   no Medications  no Follow up visit  no  Do you have questions or concerns about your Care? no  Actions: * If pain score is 4 or above: No action needed, pain <4.  Patient states he did not sleep well last night,but he feels not due to any problems or procedure. He states he been resting too much during the day. He denies any problems at this time.

## 2010-12-23 ENCOUNTER — Telehealth: Payer: Self-pay | Admitting: Internal Medicine

## 2010-12-23 ENCOUNTER — Ambulatory Visit (HOSPITAL_COMMUNITY): Payer: Medicare Other | Admitting: Internal Medicine

## 2010-12-23 ENCOUNTER — Ambulatory Visit (INDEPENDENT_AMBULATORY_CARE_PROVIDER_SITE_OTHER): Payer: Medicare Other | Admitting: *Deleted

## 2010-12-23 ENCOUNTER — Inpatient Hospital Stay (HOSPITAL_COMMUNITY)
Admission: AD | Admit: 2010-12-23 | Discharge: 2010-12-25 | DRG: 920 | Disposition: A | Discharge status: Home / Self Care | Payer: Medicare Other | Source: Ambulatory Visit | Attending: Internal Medicine | Admitting: Internal Medicine

## 2010-12-23 ENCOUNTER — Encounter: Payer: Self-pay | Admitting: Internal Medicine

## 2010-12-23 VITALS — BP 120/70 | HR 90 | Ht 67.0 in | Wt 206.0 lb

## 2010-12-23 DIAGNOSIS — I2582 Chronic total occlusion of coronary artery: Secondary | ICD-10-CM | POA: Diagnosis present

## 2010-12-23 DIAGNOSIS — H5440 Blindness, one eye, unspecified eye: Secondary | ICD-10-CM | POA: Diagnosis present

## 2010-12-23 DIAGNOSIS — E785 Hyperlipidemia, unspecified: Secondary | ICD-10-CM | POA: Diagnosis present

## 2010-12-23 DIAGNOSIS — Z860100 Personal history of colon polyps, unspecified: Secondary | ICD-10-CM

## 2010-12-23 DIAGNOSIS — I6350 Cerebral infarction due to unspecified occlusion or stenosis of unspecified cerebral artery: Secondary | ICD-10-CM

## 2010-12-23 DIAGNOSIS — Z833 Family history of diabetes mellitus: Secondary | ICD-10-CM

## 2010-12-23 DIAGNOSIS — Z8673 Personal history of transient ischemic attack (TIA), and cerebral infarction without residual deficits: Secondary | ICD-10-CM

## 2010-12-23 DIAGNOSIS — Z7901 Long term (current) use of anticoagulants: Secondary | ICD-10-CM

## 2010-12-23 DIAGNOSIS — I639 Cerebral infarction, unspecified: Secondary | ICD-10-CM

## 2010-12-23 DIAGNOSIS — Z7982 Long term (current) use of aspirin: Secondary | ICD-10-CM

## 2010-12-23 DIAGNOSIS — I4892 Unspecified atrial flutter: Secondary | ICD-10-CM

## 2010-12-23 DIAGNOSIS — Z8546 Personal history of malignant neoplasm of prostate: Secondary | ICD-10-CM

## 2010-12-23 DIAGNOSIS — Z823 Family history of stroke: Secondary | ICD-10-CM

## 2010-12-23 DIAGNOSIS — Y849 Medical procedure, unspecified as the cause of abnormal reaction of the patient, or of later complication, without mention of misadventure at the time of the procedure: Secondary | ICD-10-CM | POA: Diagnosis present

## 2010-12-23 DIAGNOSIS — Z6379 Other stressful life events affecting family and household: Secondary | ICD-10-CM

## 2010-12-23 DIAGNOSIS — I10 Essential (primary) hypertension: Secondary | ICD-10-CM | POA: Diagnosis present

## 2010-12-23 DIAGNOSIS — Y92009 Unspecified place in unspecified non-institutional (private) residence as the place of occurrence of the external cause: Secondary | ICD-10-CM

## 2010-12-23 DIAGNOSIS — E7800 Pure hypercholesterolemia, unspecified: Secondary | ICD-10-CM | POA: Diagnosis present

## 2010-12-23 DIAGNOSIS — Z8249 Family history of ischemic heart disease and other diseases of the circulatory system: Secondary | ICD-10-CM

## 2010-12-23 DIAGNOSIS — I25810 Atherosclerosis of coronary artery bypass graft(s) without angina pectoris: Secondary | ICD-10-CM | POA: Diagnosis present

## 2010-12-23 DIAGNOSIS — K922 Gastrointestinal hemorrhage, unspecified: Secondary | ICD-10-CM | POA: Insufficient documentation

## 2010-12-23 DIAGNOSIS — IMO0002 Reserved for concepts with insufficient information to code with codable children: Principal | ICD-10-CM | POA: Diagnosis present

## 2010-12-23 LAB — CBC
HCT: 34.3 % — ABNORMAL LOW (ref 39.0–52.0)
Hemoglobin: 12 g/dL — ABNORMAL LOW (ref 13.0–17.0)
Hemoglobin: 11.9 g/dL — ABNORMAL LOW (ref 13.0–17.0)
MCH: 30.4 pg (ref 26.0–34.0)
MCH: 30.5 pg (ref 26.0–34.0)
MCHC: 35 g/dL (ref 30.0–36.0)
MCHC: 35.3 g/dL (ref 30.0–36.0)
MCV: 86.4 fL (ref 78.0–100.0)
RBC: 3.95 MIL/uL — ABNORMAL LOW (ref 4.22–5.81)
RBC: 3.9 MIL/uL — ABNORMAL LOW (ref 4.22–5.81)

## 2010-12-23 LAB — BASIC METABOLIC PANEL
BUN: 17 mg/dL (ref 6–23)
CO2: 28 mEq/L (ref 19–32)
GFR calc non Af Amer: >60 mL/min (ref >60)
Glucose, Bld: 128 mg/dL — ABNORMAL HIGH (ref 70–99)
Potassium: 4.3 mEq/L (ref 3.5–5.1)
Sodium: 135 mEq/L (ref 135–145)

## 2010-12-23 NOTE — Assessment & Plan Note (Signed)
[facility]                                ON-CALL NOTE  [name], [name]                      MRN:          [id number] DATE:12/22/2010                            DOB:          18-Jun-1933   TELEPHONE NOTE  [name] called and said that he is having bright red blood per rectum. Approximately 5 days ago, he had a colonoscopy with 3 polyps removed. Yesterday, he had a small amount of painless bleeding. Tonight, he again had a bowel movement that was streaked with some blood and there was blood on the toilet tissue.  He is on Lovenox and Coumadin.  He is without pain.  He denies dizziness.  The patient was instructed to call the office first thing in the morning so that he can be seen that day.  He was also carefully instructed to contact or to go to the emergency room if he starts passing bright red blood including clots or if he experiences dizziness or lightheadedness.    Barbette Hair. Arlyce Dice, MD,FACG    RDK/MedQ  DD: 12/22/2010  DT: 12/23/2010  Job #: [id number]  cc:   Iva Boop, MD,FACG

## 2010-12-23 NOTE — Patient Instructions (Signed)
Per Dr. Leone Payor, the patient is to be admitted to [facility] with a Telemetry bed. Talked with Joni Reining, Patient is to report to [facility] Admitting.

## 2010-12-23 NOTE — Telephone Encounter (Signed)
Patient is going to Cardiology this am for a coumadin check. He will bring the results of the CBC with him to appt with Dr Leone Payor at 10:30

## 2010-12-23 NOTE — Assessment & Plan Note (Signed)
INR is today. We'll currently hold this and ask cardiology for input. I think we can start this as soon as we know his bleeding has cleared. That could be as early as tomorrow. The Lovenox is a bit different issue though given his history of stroke and the short lifespan of that may need to restart that tomorrow. Await cardiology input the

## 2010-12-23 NOTE — Progress Notes (Signed)
Subjective:    Patient ID: [name], male    DOB: 07-05-1933, 77 y.o.   MRN: [id number]  HPI 77 year old white man status post colonoscopy and polypectomy on 12/17/2010. He did well until about 2 nights ago when he saw some red blood in his stool. Last night he had a repeat of darker red blood. He does not have any abdominal pain, fever or chills or lightheadedness or dizziness. He was on a Lovenox bridge and has been resuming his warfarin per protocol and his INR is 2 today. He has only had 2 bloody bowel movements he said 3 bowel movements since his colonoscopy the first of which was normal.  Past Medical History  Diagnosis Date  . Chickenpox   . Prostate cancer   . Blood transfusion abn reaction or complication, no procedure mishap   . History of colonic polyps     adenomatous per patient  . Coronary artery disease   . Normal cardiac stress test   . Hyperlipidemia   . Hypertension   . Urinary incontinence   . Atrial flutter   . Onychomycosis   . Central retinal artery occlusion   . CVA (cerebral infarction)   . Atrial fibrillation   . History of blood clots    Past Surgical History  Procedure Date  . Coronary artery bypass graft 1989    4 vessels  . Coronary artery bypass graft 2007    and mitral valve replacement   . Mitral valve replacement 2007  . Radical prostatectomy with radiation 2003  . A flutter ablation 2010  . Bladder pump installed 2011    Dr. McDiarmid  . Colonoscopy w/ polypectomy 1999, 2002, 2005    Multiple adenomas, TV adenomas each time [facility])  . Colonoscopy w/ polypectomy 12/17/2010    4 polyps, external hemorrhoids  . Inguinal hernia repair 1993    bilatera    reports that he quit smoking about 33 years ago. He does not have any smokeless tobacco history on file. He reports that he drinks about .6 ounces of alcohol per week. He reports that he does not use illicit drugs. family history includes Alcohol abuse in his mother; Cancer in his  daughter; Coronary artery disease in his father; Diabetes in his mother; Hypertension in his mother; and Stroke in his mother.  There is no history of Colon cancer. Allergies  Allergen Reactions  . Niacin     contraindication      No current facility-administered medications for this visit. No current outpatient prescriptions on file.   Review of Systems Not weak or short of breath, not having chest pain. All other review of systems appear negative or as per history of present illness.    Objective:   Physical Exam  Constitutional: He is oriented to person, place, and time. He appears well-developed and well-nourished. No distress.  HENT:  Head: Normocephalic and atraumatic.  Eyes: No scleral icterus.  Neck: No JVD present.  Cardiovascular: Normal rate, regular rhythm and normal heart sounds.   Pulmonary/Chest: Effort normal and breath sounds normal.  Abdominal: Soft. He exhibits no distension and no mass. There is no tenderness.  Genitourinary:       Red-maroon stool  Musculoskeletal: He exhibits no edema.  Neurological: He is alert and oriented to person, place, and time.  Skin: Skin is warm and dry.  Psychiatric: He has a normal mood and affect. His behavior is normal.          Assessment & Plan:

## 2010-12-23 NOTE — Assessment & Plan Note (Signed)
The history of this has led to the Lovenox bridging. Given this we'll need to restart anticoagulation sooner rather than later, depending upon the course overall and when his  bleeding stops. And will involve cardiology for input.

## 2010-12-23 NOTE — Telephone Encounter (Signed)
Left message for patient to call back and that he needs seen this am at 10:30

## 2010-12-23 NOTE — Assessment & Plan Note (Signed)
He has had 2 episodes of hematochezia and has maroon red stool the rectal exam today. He is not unstable. However with his anticoagulation issues and is bleeding and his other comorbidities I think at least observation overnight is prudent and we will admit him to the hospital. Would prep him for possible colonoscopy tomorrow depending upon what is seen. If he clears and has no more bleeding with a stable hemoglobin I think he could go home. We'll need to involve cardiology to determine the best course of action for his anticoagulation, I am inclined that he had none for least a day or 2 though have to see how things play out.

## 2010-12-24 DIAGNOSIS — D126 Benign neoplasm of colon, unspecified: Secondary | ICD-10-CM

## 2010-12-24 DIAGNOSIS — K922 Gastrointestinal hemorrhage, unspecified: Secondary | ICD-10-CM

## 2010-12-24 DIAGNOSIS — Z86010 Personal history of colonic polyps: Secondary | ICD-10-CM

## 2010-12-24 LAB — CBC
HCT: 35.7 % — ABNORMAL LOW (ref 39.0–52.0)
Hemoglobin: 12.6 g/dL — ABNORMAL LOW (ref 13.0–17.0)
MCH: 30.7 pg (ref 26.0–34.0)
MCHC: 35.3 g/dL (ref 30.0–36.0)
MCV: 86.9 fL (ref 78.0–100.0)
RBC: 4.11 MIL/uL — ABNORMAL LOW (ref 4.22–5.81)

## 2010-12-24 LAB — PROTIME-INR
INR: 1.44 (ref 0.00–1.49)
Prothrombin Time: 17.8 seconds — ABNORMAL HIGH (ref 11.6–15.2)

## 2010-12-25 DIAGNOSIS — D62 Acute posthemorrhagic anemia: Secondary | ICD-10-CM

## 2010-12-25 DIAGNOSIS — D689 Coagulation defect, unspecified: Secondary | ICD-10-CM

## 2010-12-25 DIAGNOSIS — Z86010 Personal history of colonic polyps: Secondary | ICD-10-CM

## 2010-12-25 DIAGNOSIS — K922 Gastrointestinal hemorrhage, unspecified: Secondary | ICD-10-CM

## 2010-12-25 LAB — CBC
Platelets: 153 10*3/uL (ref 150–400)
RDW: 13 % (ref 11.5–15.5)
WBC: 6.8 10*3/uL (ref 4.0–10.5)

## 2010-12-25 LAB — PROTIME-INR
INR: 1.33 (ref 0.00–1.49)
Prothrombin Time: 16.7 seconds — ABNORMAL HIGH (ref 11.6–15.2)

## 2010-12-26 NOTE — Progress Notes (Signed)
Quick Note:  Possible carcinoma in ascending polyp i will call him about this and ask him to get plain film of abdomen to localize clips and polyp site Will determine plans with him - ? Surgical consult vs. Colonoscopy follow-up  ______

## 2010-12-27 ENCOUNTER — Ambulatory Visit (INDEPENDENT_AMBULATORY_CARE_PROVIDER_SITE_OTHER): Payer: Medicare Other | Admitting: *Deleted

## 2010-12-27 ENCOUNTER — Telehealth: Payer: Self-pay

## 2010-12-27 ENCOUNTER — Other Ambulatory Visit: Payer: Self-pay | Admitting: Internal Medicine

## 2010-12-27 ENCOUNTER — Ambulatory Visit (HOSPITAL_COMMUNITY)
Admission: RE | Admit: 2010-12-27 | Discharge: 2010-12-27 | Disposition: A | Discharge status: Home / Self Care | Payer: Medicare Other | Source: Ambulatory Visit | Attending: Internal Medicine | Admitting: Internal Medicine

## 2010-12-27 DIAGNOSIS — I639 Cerebral infarction, unspecified: Secondary | ICD-10-CM

## 2010-12-27 DIAGNOSIS — M5137 Other intervertebral disc degeneration, lumbosacral region: Secondary | ICD-10-CM | POA: Insufficient documentation

## 2010-12-27 DIAGNOSIS — Z9079 Acquired absence of other genital organ(s): Secondary | ICD-10-CM | POA: Insufficient documentation

## 2010-12-27 DIAGNOSIS — T184XXA Foreign body in colon, initial encounter: Secondary | ICD-10-CM

## 2010-12-27 DIAGNOSIS — K922 Gastrointestinal hemorrhage, unspecified: Secondary | ICD-10-CM | POA: Insufficient documentation

## 2010-12-27 DIAGNOSIS — M51379 Other intervertebral disc degeneration, lumbosacral region without mention of lumbar back pain or lower extremity pain: Secondary | ICD-10-CM | POA: Insufficient documentation

## 2010-12-27 DIAGNOSIS — I6350 Cerebral infarction due to unspecified occlusion or stenosis of unspecified cerebral artery: Secondary | ICD-10-CM

## 2010-12-27 DIAGNOSIS — I4892 Unspecified atrial flutter: Secondary | ICD-10-CM

## 2010-12-27 NOTE — Progress Notes (Signed)
Quick Note:  I explained diagnosis of possible cancer to him I want him to have a KUB to localize the clips place in his right colon and have him do it this PM I will cal him again with other results  Use foreign body in GI tract as diagnosis ______

## 2010-12-27 NOTE — Telephone Encounter (Signed)
Pt aware of Dr. Marvell Fuller recommendations. Orders in epic and spoke with Brett Canales at [facility] radiology. Pt may come for KUB when ready, no appt time for this xray per Brett Canales. Pt aware.

## 2010-12-27 NOTE — Telephone Encounter (Signed)
Message copied by Michele Mcalpine on Fri Dec 27, 2010  1:21 PM ------      Message from: Iva Boop      Created: Fri Dec 27, 2010 12:48 PM       I explained diagnosis of possible cancer to him      I want him to have a KUB to localize the clips place in his right colon and have him do it this PM      I will cal him again with other results            Use foreign body in GI tract as diagnosis

## 2010-12-30 ENCOUNTER — Emergency Department (HOSPITAL_COMMUNITY): Payer: Medicare Other

## 2010-12-30 ENCOUNTER — Telehealth: Payer: Self-pay | Admitting: Internal Medicine

## 2010-12-30 ENCOUNTER — Inpatient Hospital Stay (HOSPITAL_COMMUNITY)
Admission: EM | Admit: 2010-12-30 | Discharge: 2011-01-03 | DRG: 920 | Disposition: A | Discharge status: Home / Self Care | Payer: Medicare Other | Attending: Gastroenterology | Admitting: Gastroenterology

## 2010-12-30 ENCOUNTER — Telehealth: Payer: Self-pay

## 2010-12-30 ENCOUNTER — Ambulatory Visit: Admit: 2010-12-30 | Payer: Self-pay | Admitting: Gastroenterology

## 2010-12-30 DIAGNOSIS — Y849 Medical procedure, unspecified as the cause of abnormal reaction of the patient, or of later complication, without mention of misadventure at the time of the procedure: Secondary | ICD-10-CM | POA: Diagnosis present

## 2010-12-30 DIAGNOSIS — D62 Acute posthemorrhagic anemia: Secondary | ICD-10-CM | POA: Diagnosis present

## 2010-12-30 DIAGNOSIS — Z7901 Long term (current) use of anticoagulants: Secondary | ICD-10-CM

## 2010-12-30 DIAGNOSIS — I2510 Atherosclerotic heart disease of native coronary artery without angina pectoris: Secondary | ICD-10-CM | POA: Diagnosis present

## 2010-12-30 DIAGNOSIS — Z8546 Personal history of malignant neoplasm of prostate: Secondary | ICD-10-CM

## 2010-12-30 DIAGNOSIS — Z8673 Personal history of transient ischemic attack (TIA), and cerebral infarction without residual deficits: Secondary | ICD-10-CM

## 2010-12-30 DIAGNOSIS — Z951 Presence of aortocoronary bypass graft: Secondary | ICD-10-CM

## 2010-12-30 DIAGNOSIS — IMO0002 Reserved for concepts with insufficient information to code with codable children: Principal | ICD-10-CM | POA: Diagnosis present

## 2010-12-30 DIAGNOSIS — H5440 Blindness, one eye, unspecified eye: Secondary | ICD-10-CM | POA: Diagnosis present

## 2010-12-30 DIAGNOSIS — I10 Essential (primary) hypertension: Secondary | ICD-10-CM | POA: Diagnosis present

## 2010-12-30 DIAGNOSIS — I4891 Unspecified atrial fibrillation: Secondary | ICD-10-CM | POA: Diagnosis present

## 2010-12-30 DIAGNOSIS — K922 Gastrointestinal hemorrhage, unspecified: Secondary | ICD-10-CM

## 2010-12-30 LAB — CBC
MCHC: 35.3 g/dL (ref 30.0–36.0)
MCV: 86.8 fL (ref 78.0–100.0)
Platelets: 161 10*3/uL (ref 150–400)
RDW: 13.1 % (ref 11.5–15.5)
WBC: 7.8 10*3/uL (ref 4.0–10.5)

## 2010-12-30 LAB — BASIC METABOLIC PANEL
Chloride: 100 mEq/L (ref 96–112)
Creatinine, Ser: 1.02 mg/dL (ref 0.50–1.35)
GFR calc Af Amer: >60 mL/min (ref >60)
GFR calc non Af Amer: >60 mL/min (ref >60)

## 2010-12-30 LAB — PROTIME-INR
INR: 1.74 — ABNORMAL HIGH (ref 0.00–1.49)
Prothrombin Time: 20.7 seconds — ABNORMAL HIGH (ref 11.6–15.2)

## 2010-12-30 LAB — TYPE AND SCREEN: ABO/RH(D): O POS

## 2010-12-30 LAB — DIFFERENTIAL
Basophils Absolute: 0 10*3/uL (ref 0.0–0.1)
Eosinophils Absolute: 0.1 10*3/uL (ref 0.0–0.7)
Eosinophils Relative: 2 % (ref 0–5)
Monocytes Absolute: 0.6 10*3/uL (ref 0.1–1.0)

## 2010-12-30 LAB — ABO/RH: ABO/RH(D): O POS

## 2010-12-30 NOTE — Telephone Encounter (Signed)
All questions answered.  The tech at the hospital when he had his x-ray said something to him that made him feel there was a large problem with his x-ray.  I have reviewed the results with him and that no additional cause for concern.  He and Dr Leone Payor had discussed the polyp and Dr Leone Payor is to contact him with a plan on surveillance of polyp and he is aware that we will be calling soon.

## 2010-12-30 NOTE — Telephone Encounter (Signed)
Patient called to report that he has restarted rectal bleeding.   He had a normal BM this am, but then had another BM this am with blood in the stool.  He says also on the tissue and it is bright red. He had his last dose of lovenox yesterday and his first dose of coumadin this am.  Per Dr Leone Payor needs direct admit.  Patient advised that he needs to go to [facility] ER and let them know that he was sent there by [facility].  Patient verbalized understanding.

## 2010-12-30 NOTE — Consult Note (Signed)
NAMEABDIRAHMAN, CHITTUM NO.:  [id number]  MEDICAL RECORD NO.:  [id number]  LOCATION:  3714                         FACILITY:  [facility]  PHYSICIAN:  Noralyn Pick. Eden Emms, MD, FACCDATE OF BIRTH:  1933-11-03  DATE OF CONSULTATION:  12/23/2010 DATE OF DISCHARGE:                                CONSULTATION   Mr. Jokerst is a 77 year old patient admitted to the hospital for hematochezia.  He is status post recent polypectomy.  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  The patient is currently in sinus rhythm.  He was to be referred to Dr. Johney Frame for possible left-sided flutter ablation, but had an emboli to his eye.  Workup in January by Dr. Graciela Husbands showed no high- grade ipsilateral carotid disease.  The patient has been using Lovenox bridging.  He had a colonoscopy with polypectomy a few days ago and subsequently developed bloody stools.  He was admitted to the hospital and currently has an INR 2.0 and hematocrit of 34.3.  Initial bowel movement was fine, but he has had two bloody bowel movements.  He was still on the Lovenox bridge as his INR was subtherapeutic.  He has a history of coronary artery disease with distant coronary artery bypass surgery.  He had a cardiac CT in March 25, 2007, which showed an occluded LIMA to the LAD and an occluded vein graft to the first obtuse marginal branch.  He had a patent vein graft to the PDA, patent vein graft to the first diagonal, and a patent vein graft to the LAD.  Overall, LV function has been normal.  In May 2010, he had no cardiac source of embolus during a TEE and which he was cardioverted from atrial flutter.  This was done by Dr. Reyes Ivan.  I had a long discussion with Mr. Andrus and I tried to page Dr. Leone Payor hopefully I can talk to him later today.  I think given his history of retinal emboli and recurrent atrial flutter that happens without warning that he does  need Lovenox bridge.  He also indicates that he may need left knee surgery in the near future, which will once again necessitate Lovenox bridging.  He appears very stable at this time, with a blood pressure of 149/79 and a hemoglobin of 12.  I would leave him with out any dose of Coumadin or Lovenox today.  I would try to restart his Lovenox tomorrow and give him a dose of Coumadin.  I would keep him in the hospital 48 more hours so long as he tolerates three more doses of Lovenox and has an INR of 2.5.  I think he can be discharged on Wednesday.  He will need close followup in the Coumadin Clinic, and I know Weston Brass knows him well.  His 10-point review of systems is otherwise negative in particular he has not had any chest pain, shortness of breath, or syncope.  All other systems reviewed, negative.  PAST MEDICAL HISTORY:  Remarkable for polypectomy, retinal artery occlusion, coronary bypass surgery, recurrent atrial flutter, history of stress incontinence, left knee arthritis with potential medial meniscus surgery in the  near future, previous infected urinary sphincter device.  CURRENT MEDICATIONS: 1. Lisinopril 30 mg a day. 2. Metoprolol 25 mg a day. 3. Benicar 10 mg a day. 4. Simvastatin 40 mg a day. 5. Tramadol 50 mg a day.  FAMILY HISTORY:  Remarkable for no premature coronary artery disease. He is a retired Sport and exercise psychologist.  He is raising is 35 year old granddaughter.  His wife is with him in the room.  He has twin grandchildren being born on Saturday and would like to visit them, does not smoke or drink, fairly active.  PHYSICAL EXAMINATION:  VITAL SIGNS:  Remarkable for blood pressure of 149/79, pulse 67 and regular, respiratory rate 14, afebrile, sats 96% on room air. HEENT:  Unremarkable. NECK:  Carotids are without bruit.  No lymphadenopathy, thyromegaly, or JVP elevation. LUNGS:  Clear, good diaphragmatic motion.  No wheezing.  S1 and S2. Normal heart sounds.   PMI normal.  Status post CABG. ABDOMEN:  Benign, bowel sounds are positive, hyperactive.  No tenderness.  No hepatosplenomegaly, hepatojugular reflux, or tenderness. EXTREMITIES:  Distal pulses are intact.  No edema. NEURO:  Nonfocal. SKIN:  Warm and dry.  No muscular weakness.  Lab work was as discussed with potassium 4.3, creatinine 0.93. Hemoglobin 12, platelet count of 140.  PSA zero.  IMPRESSION: 1. Gastrointestinal bleed post polypectomy holding Lovenox today as     well as Coumadin.  Restart Lovenox in the morning as well as a dose     of Coumadin, which I would give as 5 mg tomorrow followed by 7.5 mg     the following day.  We would keep him in the hospital until     Wednesday afternoon after three doses of Lovenox.  If his INR is     above 2.5, he can be discharged so long as his hemoglobin is stable     and he has not had a recurrent bleed. 2. Hypertension.  The patient has been written for an ACE inhibitor     and an ARB.  His home medicines would appear to include just Avapro     150 mg half a tab daily.  I will try to reconcile this, but I do     not see a med list listed in his epic chart. 3. Hypercholesteremia.  Continue statin.  I think in light of previous     retinal embolus and coronary artery disease. 4. Coronary disease with previous coronary artery bypass graft, known     occluded graft to the diagonal and left internal mammary artery,     currently not having any angina, try to keep hemoglobin above 10.     Noralyn Pick. Eden Emms, MD, [facility]     PCN/MEDQ  D:  12/23/2010  T:  12/24/2010  Job:  [id number]  Electronically Signed by Charlton Haws MD [facility] on 12/30/2010 10:17:26 PM

## 2010-12-31 DIAGNOSIS — K922 Gastrointestinal hemorrhage, unspecified: Secondary | ICD-10-CM

## 2010-12-31 DIAGNOSIS — I4891 Unspecified atrial fibrillation: Secondary | ICD-10-CM

## 2010-12-31 DIAGNOSIS — Z7901 Long term (current) use of anticoagulants: Secondary | ICD-10-CM

## 2010-12-31 LAB — CBC
HCT: 30.1 % — ABNORMAL LOW (ref 39.0–52.0)
Hemoglobin: 10.4 g/dL — ABNORMAL LOW (ref 13.0–17.0)
MCH: 30.1 pg (ref 26.0–34.0)
MCV: 87 fL (ref 78.0–100.0)
Platelets: 162 10*3/uL (ref 150–400)
RBC: 3.46 MIL/uL — ABNORMAL LOW (ref 4.22–5.81)
WBC: 5.7 10*3/uL (ref 4.0–10.5)

## 2011-01-01 DIAGNOSIS — Z7901 Long term (current) use of anticoagulants: Secondary | ICD-10-CM

## 2011-01-01 DIAGNOSIS — K922 Gastrointestinal hemorrhage, unspecified: Secondary | ICD-10-CM

## 2011-01-01 LAB — BASIC METABOLIC PANEL
BUN: 9 mg/dL (ref 6–23)
CO2: 29 mEq/L (ref 19–32)
Calcium: 8.8 mg/dL (ref 8.4–10.5)
Chloride: 102 mEq/L (ref 96–112)
Creatinine, Ser: 0.84 mg/dL (ref 0.50–1.35)
Glucose, Bld: 91 mg/dL (ref 70–99)

## 2011-01-01 LAB — CBC
HCT: 32.1 % — ABNORMAL LOW (ref 39.0–52.0)
Hemoglobin: 11 g/dL — ABNORMAL LOW (ref 13.0–17.0)
MCH: 30.2 pg (ref 26.0–34.0)
MCHC: 34.3 g/dL (ref 30.0–36.0)
MCV: 88.2 fL (ref 78.0–100.0)
RDW: 13.2 % (ref 11.5–15.5)

## 2011-01-02 DIAGNOSIS — Z7901 Long term (current) use of anticoagulants: Secondary | ICD-10-CM

## 2011-01-02 DIAGNOSIS — K922 Gastrointestinal hemorrhage, unspecified: Secondary | ICD-10-CM

## 2011-01-03 ENCOUNTER — Encounter: Payer: Medicare Other | Admitting: *Deleted

## 2011-01-03 LAB — DIFFERENTIAL
Basophils Relative: 0 % (ref 0–1)
Eosinophils Absolute: 0.2 10*3/uL (ref 0.0–0.7)
Lymphs Abs: 1.9 10*3/uL (ref 0.7–4.0)
Monocytes Absolute: 0.7 10*3/uL (ref 0.1–1.0)
Monocytes Relative: 10 % (ref 3–12)
Neutrophils Relative %: 60 % (ref 43–77)

## 2011-01-03 LAB — PROTIME-INR: Prothrombin Time: 14.7 seconds (ref 11.6–15.2)

## 2011-01-03 LAB — CBC
MCH: 30.2 pg (ref 26.0–34.0)
MCHC: 34.7 g/dL (ref 30.0–36.0)
MCV: 86.9 fL (ref 78.0–100.0)
Platelets: 184 10*3/uL (ref 150–400)
RBC: 3.58 MIL/uL — ABNORMAL LOW (ref 4.22–5.81)

## 2011-01-06 ENCOUNTER — Telehealth: Payer: Self-pay | Admitting: Internal Medicine

## 2011-01-06 NOTE — Telephone Encounter (Signed)
He is correct 8/30 too long Let's work him in next week with me

## 2011-01-06 NOTE — Telephone Encounter (Signed)
Patient was told on his discharge instructions that he had an appt today with Dr Leone Payor , but the appt is for 02/06/11.  He had a BM today and the stool was brown no sign of bleeding.  He has restarted his coumadin today.  He is not comfortable waiting until 02/06/11 for a follow up.  Dr Leone Payor when do you want to see him back in the office?

## 2011-01-07 NOTE — Telephone Encounter (Signed)
Patient is rescheduled for 01/15/11 9:15

## 2011-01-07 NOTE — H&P (Signed)
[name], [name] NO.:  [id number]  MEDICAL RECORD NO.:  [id number]  LOCATION:  4508                         FACILITY:  [facility]  PHYSICIAN:  Judie Petit T. Russella Dar, MD, FACGDATE OF BIRTH:  1934-03-07  DATE OF ADMISSION:  12/30/2010 DATE OF DISCHARGE:                             HISTORY & PHYSICAL   PRIMARY GASTROENTEROLOGY PHYSICIAN:  Iva Boop, MD, [facility]  PRIMARY PHYSICIAN:  Tera Mater. Clent Ridges, MD  CARDIOLOGIST:  Duke Salvia, MD, [facility]  REASON FOR ADMISSION:  Recurrent blood mixed into stool.  BRIEF HISTORY:  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 He also has a history of prostatectomy and other problems which are listed below.  He has a history of colon adenomas in 2009.  On December 17, 2010, the patient underwent colonoscopy off Coumadin.  Four polyps were removed, 3 were in the ascending colon and 1 was in the transverse colon.  His Coumadin had been held preoperatively and he was on a Lovenox bridge and Lovenox was briefly held for the colonoscopy of December 17, 2010.  Afterwards, he was restarted on Lovenox and Coumadin.  He called GI on December 23, 2010, when he had gross rectal bleeding.  The first occurrence of bleeding had been on December 21, 2010, and again on December 22, 2010.  This was not associated with any dizziness.  The Lovenox bridge had just discontinued on December 23, 2010.  Outpatient INR on December 23, 2010, was 2.0.  He was examined in the GI office, was hemodynamically stable, but had maroon stool on exam and was admitted for postpolypectomy bleed.  The patient was hospitalized from December 23, 2010, through December 25, 2010, for the postpolypectomy bleed.  He did not have any significant change in his hemoglobin as it measured 12 on December 23, 2010, and 11.9 on December 25, 2010.  His PT was 17.8, his INR  was 1.4 on December 24, 2010.  He underwent another colonoscopy on December 24, 2010, performed by Dr. Lina Sar.  She encountered clot adherent to polypectomy site in the right colon.  She treated this with injections of epinephrine and attached two Endoclips.  There was no active bleeding either before, during, or after the treatment of the polypectomy site.  However, she does note in her colonoscopy report that she saw specks of blood throughout the colon.  Cardiology, Dr. Charlton Haws was called in regarding how to proceed with anticoagulation for this patient.  He advised restarting a Lovenox bridge and restarting Coumadin.  Thus, anticoagulation was reinitiated on December 24, 2010.  He recommended specific doses of Coumadin and Lovenox.  The patient went home on December 25, 2010.  He had not had any recurrent bleeding following colonoscopy prep and his hemoglobin/hematocrit was stable.  His INR was 1.33 and his hemoglobin was 11.9 on the day of discharge.  On December 30, 2010, the patient had a normal brown bowel movement in the morning.  That afternoon, he passed a bloody stool.  This was not associated with any  pain, nausea, vomiting, or dizziness.  He contacted Dr. Marvell Fuller office and was advised to come to the emergency room at [facility].  His hemoglobin had declined to 10.9.  His INR was 1.7 in the ED.  He had been taking the Coumadin and the Lovenox.  However, he had just finished the Lovenox on December 29, 2010.  He was admitted in stable condition by Dr. Claudette Head to the GI Service for close observation, Cardiology consult.  Of note, the patient's biopsy report on the polyp in the ascending colon shows tubulovillous adenoma with high-grade glandular dysplasia and foci highly suspicious for invasive carcinoma.  Other polyps from the ascending as well as transverse colon showed only adenomatous changes.  PAST MEDICAL HISTORY: 1. Atrial flutter, chronic Coumadin. 2. 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 3. History of coronary artery disease with bypass surgery in 1989,     redo bypass surgery with mitral valve repair involving a mitral     valve ring in 2007. 4. History of prostate cancer, for which, he underwent prostatectomy     and radiation therapy in 2003. 5. Hyperlipidemia. 6. Hypertension. 7. Stress incontinence.  He had undergone implantation of an     artificial urinary sphincter. In 2009, this was removed at     cystoscopy.  In 2011, he underwent installation of a bladder pump. 8. History of atrial flutter/atrial fib status post ablation in 2010. 9. Status post bilateral inguinal hernia repairs around 1993. 10.Degenerative joint disease/osteoarthritis. 11.Remote history of bleeding ulcer in 1958.  CURRENT MEDICATIONS: 1. Lisinopril 20 mg daily. 2. Tramadol 50 mg 1 tablet q.4 h. p.r.n. pain. 3. Simvastatin 40 mg daily at bedtime. 4. Multivitamins 1 tablet daily. 5. Metoprolol 25 mg half tablet once daily. 6. Glucosamine/chondroitin 500/400 mg 1 capsule twice daily. 7. Coumadin 7.5 mg taken on Tuesdays, Thursdays, Saturdays, and     Sundays and Coumadin 5 mg taken on Mondays, Wednesdays, and     Fridays.  LABORATORIES:  Hemoglobin 10.9, hematocrit 30.9, white blood cell count 7.8, platelet count 161, MCV 86.8.  PT 20.7, INR 1.7.  Sodium 135, potassium 4.1, chloride 100, CO2 of 28, glucose 90, BUN 19, creatinine 1.0.  RADIOLOGY:  Single-view abdominal film shows no evidence for bowel obstruction and surgical clip in the region of the cecum and numerous surgical clips overlying the pelvis.  PHYSICAL EXAMINATION:  VITAL SIGNS:  Blood pressure 128/82, pulse 83, respirations 18, temperature 97.8, saturation 95% on room air.  Current weight not obtained, however, his weight on December 23, 2010, 91.5 kg. GENERAL:  The patient is a well-appearing, elderly white male in no distress.  He is a bit anxious and  talkative.  He is slightly overweight. HEENT:  Sclerae are nonicteric.  Conjunctivae are pink.  ENT exam, oropharynx is clear and moist.  No lesions, no blood.  Dental, the patient has a bridge in his upper teeth, has some crowns and caps in his teeth. NECK:  Supple.  No JVD evident.  No masses. LUNGS:  Clear to auscultation and percussion bilaterally. CARDIAC:  Rhythm is regular.  No murmurs, rubs, or gallops.  No arrhythmias.  S1, S2 audible. ABDOMEN:  Soft, nontender, nondistended.  There is no hepatosplenomegaly, no masses, no bruising. EXTREMITIES:  No cyanosis, clubbing, or edema. RECTAL:  Deferred. NEUROLOGIC:  The patient is alert and oriented x3.  There is no tremor. He moves all fours and is able to sit  and stand up on his own without help.  The patient has impaired hearing, but does not use hearing aids. PSYCHIATRIC:  The patient denies depression, but does endorse insomnia sometimes, this is from his knee pain.  ALLERGIES:  NIACIN which, the reaction, states interacts with Coumadin, specifics of the interaction not known.  FAMILY HISTORY:  No family history of colon cancer.  There is a history of alcohol abuse, coronary artery disease, stroke, hypertension, and diabetes mellitus.  SOCIAL HISTORY:  The patient drinks less than a glass of wine per week. He smoked in the past, but has since quit.  He is married.  He is a retired Sport and exercise psychologist.  REVIEW OF SYSTEMS:  CONSTITUTIONAL:  The patient feels well.  He does not feel weak.  Weight is relatively stable.  His clothes fit in the stable manner.  NEUROLOGIC:  No headaches.  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  ENT:  No sore throat.  No dental pain.  No nosebleeds.  CARDIOVASCULAR:  No palpitations.  No chest pain. No extremity edema.  RESPIRATORY/PULMONARY:  No shortness of breath.  No cough.  No dyspnea on exertion.  GI:  In addition to the above, the patient does not endorse any dysphagia,  any reflux, or constipation. MUSCULOSKELETAL:  Does get stiffness in his hand and generalized joint stiffness.  He has arthritis in his knees and he says that his left knee needs to be repaired.  GU:  The patient does endorse urinary incontinence, but no hematuria.  HEMATOLOGIC:  The patient states that he had a transfusion in approximately 1958, when he had a bleeding ulcer.  IMPRESSION: 1. Recurrent postpolypectomy bleed in a patient that is on chronic     Coumadin and recent Lovenox bridge. 2. History of adenomatous colon polyps.  On the recent colonoscopy,     one of the 4 polyps removed showed high-grade dysplasia.  The     patient may require a surgical evaluation and resection, but we     will confer with Dr. Leone Payor as to how to approach this.  Other     alternatives would be repeat colonoscopy with repeat biopsies in a     few months. 3. History of atrial flutter and atrial fibrillation.  He is status     post prior ablation. 4. History of embolic retinal artery cerebrovascular accident causing     blindness. 5. Chronic anticoagulation with Coumadin. 6. Anxiety. 7. History of prostate cancer status post prostatectomy and radiation     therapy. 8. Acute blood loss anemia.  At this point, does not require any blood     transfusions. 9. Remote, 1950s, history of bleeding peptic ulcer disease. PLAN: 1. Admit the patient for observation, serial CBCs, serial coagulation     studies, clear liquid diet, support with IV fluids. 2. For now, we will hold Lovenox and Coumadin and quickly have     Cardiology follow along with this and guide anticoagulation. 3. Consider General Surgery consult for evaluation of high-grade     dysplastic polyp.     Jennye Moccasin, PA-C   ______________________________ Venita Lick. Russella Dar, MD, [facility]    SG/MEDQ  D:  12/31/2010  T:  12/31/2010  Job:  [id number]  Electronically Signed by Jennye Moccasin PA-C on 01/07/2011 03:20:32 PM Electronically  Signed by Claudette Head MD FACG on 01/07/2011 04:05:09 PM

## 2011-01-10 ENCOUNTER — Ambulatory Visit (INDEPENDENT_AMBULATORY_CARE_PROVIDER_SITE_OTHER): Payer: Medicare Other | Admitting: *Deleted

## 2011-01-10 DIAGNOSIS — I639 Cerebral infarction, unspecified: Secondary | ICD-10-CM

## 2011-01-10 DIAGNOSIS — I4892 Unspecified atrial flutter: Secondary | ICD-10-CM

## 2011-01-10 DIAGNOSIS — I6350 Cerebral infarction due to unspecified occlusion or stenosis of unspecified cerebral artery: Secondary | ICD-10-CM

## 2011-01-10 LAB — POCT INR: INR: 1.1

## 2011-01-14 ENCOUNTER — Telehealth: Payer: Self-pay | Admitting: Internal Medicine

## 2011-01-14 NOTE — Telephone Encounter (Signed)
I spoke with the patient. He was recently in the hospital with complications of bleeding post colonscopy. He was seen by cardiology while there. He is now in need of knee surgery. I explained we will need to see him post hospital for his clearance and he will need Lovenox bridging secondary to stroke. I will review the PA schedule with Scott tomorrow to see if I can work the patient in with him on 01/22/11. I will call the patient back tomorrow. He is agreeable.

## 2011-01-14 NOTE — Telephone Encounter (Signed)
Pt calling back to get in touch w/ Heather. Please return pt call-regarding pt having knee surgery.

## 2011-01-14 NOTE — Telephone Encounter (Signed)
Pt having knee surgery and needs clearance to  Do this.  Also needs to know about coumadin.  Does he need to be seen or can he clear without being seen?  In quite a bit of pain and would like to have ASAP. Please call patient and let him know.  Cell number is [phone].

## 2011-01-15 ENCOUNTER — Encounter: Payer: Self-pay | Admitting: Internal Medicine

## 2011-01-15 ENCOUNTER — Ambulatory Visit (INDEPENDENT_AMBULATORY_CARE_PROVIDER_SITE_OTHER): Payer: Medicare Other | Admitting: Internal Medicine

## 2011-01-15 VITALS — BP 104/54 | HR 68 | Ht 67.0 in | Wt 205.0 lb

## 2011-01-15 DIAGNOSIS — K5900 Constipation, unspecified: Secondary | ICD-10-CM

## 2011-01-15 DIAGNOSIS — Z860100 Personal history of colon polyps, unspecified: Secondary | ICD-10-CM

## 2011-01-15 DIAGNOSIS — C61 Malignant neoplasm of prostate: Secondary | ICD-10-CM

## 2011-01-15 DIAGNOSIS — D62 Acute posthemorrhagic anemia: Secondary | ICD-10-CM

## 2011-01-15 DIAGNOSIS — Z7901 Long term (current) use of anticoagulants: Secondary | ICD-10-CM

## 2011-01-15 DIAGNOSIS — K922 Gastrointestinal hemorrhage, unspecified: Secondary | ICD-10-CM

## 2011-01-15 MED ORDER — FERROUS SULFATE 325 (65 FE) MG PO TABS: 325.0000 mg | ORAL_TABLET | Freq: Every day | ORAL | Status: DC

## 2011-01-15 NOTE — Patient Instructions (Signed)
A colon recall have been put in for Oct. We will contact you at that time. A lab order for a CBC has been place for you to have around Oct 20, please return to have that drawn. Please start Ferrous Sulfate 325 mg 1 tablet every day, you can get this over the counter. Call us back around the first week of November if you haven't heard from [place] by then.

## 2011-01-15 NOTE — Telephone Encounter (Signed)
I spoke with the patient about his apptointment with Scott. He is agreeable.

## 2011-01-15 NOTE — Telephone Encounter (Signed)
Per Lorin Picket, he can see the patient on 8/15 at 3:15pm. I have left a message for the patient at his home and cell #'s to call.

## 2011-01-15 NOTE — Progress Notes (Signed)
  Subjective:    Patient ID: [name], male    DOB: 1933/11/25, 77 y.o.   MRN: [id number]  HPI Follow-up after two admissions for post-polypectomy bleeding. Treated with endoscopic clip and EPI. No bleeding, back on warfarin. He is complaining of new constipation - straining and infrequent defecation every 2-3 days but had been daily before Avapro was changed to Lisinopril Also on Tramadol for knee pain - Dr. Eulah Pont is to operate on his knee.    Review of Systems As above    Objective:   Physical ExamWDWN NAD        Assessment & Plan:

## 2011-01-15 NOTE — Telephone Encounter (Signed)
Returning call to [facility]. Please return pt call.

## 2011-01-16 ENCOUNTER — Encounter: Payer: Self-pay | Admitting: Internal Medicine

## 2011-01-16 DIAGNOSIS — K5900 Constipation, unspecified: Secondary | ICD-10-CM | POA: Insufficient documentation

## 2011-01-16 DIAGNOSIS — D62 Acute posthemorrhagic anemia: Secondary | ICD-10-CM | POA: Insufficient documentation

## 2011-01-16 NOTE — Assessment & Plan Note (Signed)
I think this is likely from Tramadol. MOM as needed advised.

## 2011-01-16 NOTE — H&P (Signed)
[name], [name] NO.:  [id number]  MEDICAL RECORD NO.:  [id number]  LOCATION:  3714                         FACILITY:  [facility]  PHYSICIAN:  Iva Boop, MD,FACGDATE OF BIRTH:  09-17-1933  DATE OF ADMISSION:  12/23/2010 DATE OF DISCHARGE:                             HISTORY & PHYSICAL   HISTORY OF PRESENT ILLNESS:  [name] is a 77 year old male who was recently evaluated by Dr. Leone Payor in our office for history of colon polyps previously removed in [place].  The patient saw Dr. Leone Payor to discuss repeat colonoscopy, he had no active GI symptoms at the time of this visit.  Mr. Kolbeck is on chronic Coumadin for history of atrial flutter.  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  In preparation for a colonoscopy, our office contacted the patient's cardiologist regarding anticoagulation management. The patient's Coumadin was held, he was started on Lovenox.  On December 17, 2010, [name] had a colonoscopy with polypectomy.  A total of 4 polyps were removed, 3 in the ascending colon, and 1 in the transverse colon. One of the descending colon polyps was 15 mm and it was removed by hot snare piecemeal fashion.  The second ascending colon polyp was 7 mm.  It was removed by hot snare as well.  Pathology of the polyps is pending. The patient called our on-call physician yesterday evening with reports of intermittent bleeding since his colonoscopy.  The patient was worked in for an appointment this morning with Dr. Leone Payor.  He reported episode of rectal bleeding 2 nights ago and another episode last night. He had no abdominal pain, fever, chills, lightheadedness, or dizziness. Today was actually the first day he was not going to need Lovenox.  INR checked today as an outpatient was 2.0.  In the office, the patient had maroon stool on examination.  The patient was hemodynamically stable but given his comorbidities and need for  anticoagulation, it was felt best to admit the patient for further evaluation and treatment.  PAST MEDICAL HISTORY:  Prostate cancer, history of colon polyps, coronary artery disease, hyperlipidemia, hypertension, atrial flutter, cerebrovascular accident, central retinal artery occlusion, history of blood clots.  FAMILY HISTORY:  Alcohol abuse, coronary artery disease, hypertension, cerebrovascular accident, diabetes.  No colorectal cancer.  SOCIAL HISTORY:  Former smoker.  The patient consumes 1 glass of wine or less a week.  HOME MEDICATIONS: 1. Aspirin 81 mg daily. 2. Glucosamine chondroitin 1 tablet daily. 3. Avapro 75 mg at bedtime. 4. Lisinopril 20 mg daily. 5. Lopressor 25 mg 1/2 tablet by mouth daily. 6. Multiple vitamin. 7. Resveratrol 100 mg 1 tablet daily. 8. Zocor 40 mg at bedtime. 9. Ultram 50 mg every 4 hours as needed. 10.Coumadin 5 mg as directed by Anticoagulation Clinic.  ALLERGIES:  NIACIN.  REVIEW OF SYSTEMS:  No weakness or shortness of breath.  No chest pain. All other review of systems appear negative or as per history of present illness.  PHYSICAL ASSESSMENT:  GENERAL:  A 77 year old white male in no acute distress. HEAD:  Normocephalic, atraumatic. EYES:  No scleral icterus. NECK:  No JVD present. CARDIOVASCULAR:  Normal  rate and rhythm and normal heart sounds. PULMONARY/CHEST:  Effort normal and breath sounds normal. ABDOMEN:  Soft, no distention, no masses felt.  Nontender. RECTAL:  Red maroon stool. EXTREMITIES:  No lower extremity edema. NEUROLOGICAL:  Alert and oriented to person, place, and time. SKIN:  Warm and dry. PSYCHIATRIC:  Normal mood and affect.  His behavior is normal.  ASSESSMENT/PLAN: 1. Post polypectomy bleed.  The patient had 2 episodes of hematochezia     and had maroon red stool on rectal examination in the office today.     He is hemodynamically stable but given his comorbidities and     anticoagulation issues, it was  felt that the patient be admitted     for further evaluation and treatment.  Plan will be to prep him for     a colonoscopy tomorrow.  If he clears and has no more bleeding with     a stable hemoglobin, he should be able to go home tomorrow.  We     will consult Cardiology to determine the best course of action for     his anticoagulation. 2. History of cerebrovascular accident.  The history of this has led     to Lovenox bridging.  Given this we will need to restart     anticoagulation sooner rather than later depending on the patient's     clinical course.  We will ask Cardiology for their input. 3. Chronic anticoagulation.  INR is 2.0 today.  Once bleeding has     stopped, Coumadin can be resumed.  Lovenox may need to be restarted     as early as tomorrow given the patient's history of stroke.     [name], NP   ______________________________ Iva Boop, MD,FACG    PG/MEDQ  D:  12/23/2010  T:  12/24/2010  Job:  [id number]  Electronically Signed by [name] NP on 01/15/2011 03:01:44 PM Electronically Signed by Stan Head MDFACG on 01/16/2011 09:39:42 PM

## 2011-01-16 NOTE — Assessment & Plan Note (Signed)
Improved Reassess Hgb around 8/20

## 2011-01-16 NOTE — Assessment & Plan Note (Signed)
Two admissions for this with recurrent bleeding after initial hospitalization presumed due to anti-coagulation. Endoscopic therapy with EPI injection and clipping at 7/17 colonoscopy. His anticoagulation was held instead of restarting second time and no further bleeding. He has since resumed warfarin and no bleeding.

## 2011-01-16 NOTE — Assessment & Plan Note (Signed)
At this point plan for a colonoscopy with APC available in [place]. Anticipate holding his warfarin but will need to reconsider how to apply enoxaparin bridge and restarting. He understands there is possible but not proven carcinoma in the polyp. It may have been completely removed. Does not really make sense to consider surgery given overall situation and comorbid conditions. He wants to get knee surgery next.

## 2011-01-17 ENCOUNTER — Telehealth: Payer: Self-pay | Admitting: Gastroenterology

## 2011-01-17 ENCOUNTER — Ambulatory Visit (INDEPENDENT_AMBULATORY_CARE_PROVIDER_SITE_OTHER): Payer: Medicare Other | Admitting: *Deleted

## 2011-01-17 DIAGNOSIS — I6350 Cerebral infarction due to unspecified occlusion or stenosis of unspecified cerebral artery: Secondary | ICD-10-CM

## 2011-01-17 DIAGNOSIS — I4892 Unspecified atrial flutter: Secondary | ICD-10-CM

## 2011-01-17 DIAGNOSIS — I639 Cerebral infarction, unspecified: Secondary | ICD-10-CM

## 2011-01-17 LAB — POCT INR: INR: 1.9

## 2011-01-17 NOTE — Telephone Encounter (Signed)
Patient informed that there was a typo for lab. CBC is for Aug 20 not Oct 20.

## 2011-01-17 NOTE — Telephone Encounter (Signed)
Message copied by Bernita Buffy on Fri Jan 17, 2011  9:27 AM ------      Message from: Stan Head E      Created: Thu Jan 16, 2011  8:07 PM      Regarding: lab date       There is a typo in patient instructions from last visit       Please doublecheck that he knows cbc is for 8/20.

## 2011-01-18 NOTE — Discharge Summary (Signed)
[name], [name] NO.:  [id number]  MEDICAL RECORD NO.:  [id number]  LOCATION:  4508                         FACILITY:  [facility]  PHYSICIAN:  Judie Petit T. Russella Dar, MD, FACGDATE OF BIRTH:  [birth year]  DATE OF ADMISSION:  12/30/2010 DATE OF DISCHARGE:  01/03/2011                              DISCHARGE SUMMARY   ADMITTING DIAGNOSES: 1. Recurrent post polypectomy bleed in a patient who recently resumed     chronic Coumadin and recently finished a Lovenox bridge. 12/24/2010     colonoscopy with hemoststic therapy. 2. History of adenomatous colon polyps.  On 12/17/2010 colonoscopy 1 of 4     polyps removed showed high-grade dysplasia.  Plans as to surgical     consult and resection versus close followup colonoscopies have not     yet has been decided.  The patient will be following up with Dr.     Leone Payor closely for management of polyp with high-grade dysplasia. 3. History of recent post colon polypectomy bleed requiring admission     July 17 through December 25, 2010. 4. History of atrial flutter and fibrillation.  He has undergone prior     ablation therapy. 5. 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 6. Anxiety. 7. History of prostate cancer.  Status post prostatectomy and     radiation therapy. 8. Acute blood loss anemia, currently not requiring transfusions. 9. History of remote bleeding peptic ulcer disease in the 1950s. 10.Degenerative joint disease/osteoarthritis. 11.Status post bilateral inguinal hernia repairs around 1993. 12.Urinary stress incontinence.  He has undergone implantation of an     artificial urinary sphincter which in 2009 was removed at     cystoscopy.  In 2011, the patient underwent installation of a     bladder pump. 13.Hypertension. 14.Hyperlipidemia. 15.Coronary artery disease.  Status post coronary artery bypass     grafting in 1989.  Redo bypass surgery with mitral valve repair   involving mitral valve ring in 2007.  DISCHARGE DIAGNOSES: 1. Recurrent post polypectomy bleeding in setting of Lovenox and     Coumadin.  Did not require repeat colonoscopy. 2. Chronic anticoagulation for history of atrial fibrillation and     flutter and embolic retinal artery stroke. 3. Anxiety. 4. Anemia, acute blood loss type, did not require transfusion during     this admission.  BRIEF HISTORY:  [name] is a 77 year old gentleman who underwent colonoscopy off Coumadin on December 17, 2010.  Four polyps were removed. The patient was restarted on Lovenox bridge to full Coumadin therapy after the procedure.  He developed a post polypectomy bleed and was admitted to the hospital on December 23, 2010.  At the time of that admission, his INR had reached 2.0.  He was in stable condition.  He was hospitalized July 16, through December 25, 2010, for the post polypectomy bleed.  He did not have significant change in the hemoglobin during that time.  He underwent repeat colonoscopy on December 24, 2010.  Dr. Lina Sar encountered adherent clot at the site of a polypectomy in the right colon.  She treated this with injections of epinephrine and application of  2 endo clips.  No active bleeding was seen before, during or after treatment of the polypectomy site.  During that hospitalization, Dr. Charlton Haws advised [place] as to when restarting Coumadin and Lovenox.  Anticoagulation with Lovenox bridge to Coumadin was restarted on December 24, 2010.  The patient went home in stable condition on December 25, 2010, and his hemoglobin had only gone from 12 to 11.9 during his stay.  The afternoon of December 30, 2010, he passed a small amount of bloody material, having  had a normal brown bowel movement earlier that day.  He called Dr. Marvell Fuller, his primary GI physician, office and was advised to come to the emergency room where hemoglobin measured 10.9 and his INR was 1.7. He had just finished the Lovenox bridge on  December 29, 2010, but was taking his Coumadin doses as had been directed.  The patient was admitted to the hospital for a recurrent post polypectomy bleed in the setting of anticoagulation therapy. Hemodynamically, he was in stable condition.  He had no upper GI symptoms, nor any belly pain.  CONSULTATION:  With Dr. Kristeen Miss for Cardiology evaluation.  PROCEDURES:  None.  LABORATORIES:  Hemoglobin ranged 10.9 at admission, it was 10.8 at discharge.  Hematocrit 30.9 on admission and 31.1 at discharge. Platelets ranged to 161-184.  White blood cell count 7.8.  Initial INR 1.74, 1.13 at discharge.  PT 20.7 on admission, 14.7 at discharge. Sodium 135, potassium 4.1.  Chloride 100, CO2 28.  Glucose 90, BUN 19, creatinine 1.0.  HOSPITAL COURSE:  The patient was admitted to a telemetry bed. Throughout his hospitalization, blood pressure and pulse were within normal ranges.  The patient had no bowel movements initially but within 36 hours he passed a dark stool that leached some old blood into the commode water.  He never had any recurrent fresh blood.  Ultimately, stools were brown.  Serial CBCs were followed and hemoglobin and hematocrit were stable.  The patient's Coumadin was discontinued temporarily.  The INR drifted down to a normal range.  Dr. Elease Hashimoto and later Dr. Graciela Husbands spoke with the patient and reassured him that although they were holding anticoagulation therapy for the next few days that his risk of stroke based upon [place] score at 3 were not inordinate. Ultimately, the plan was to restart Coumadin on Monday January 06, 2011. He was to follow up for INR check at the [facility] on Friday January 10, 2011. They did not recommend Lovenox bridge.  During hospitalization, the patient was hemodynamically stable. Initially, the patient was given clear liquids to eat but diet was ultimately advanced to full liquids and at discharge he was to follow a low fiber diet for  approximately 1 week.  In addition to followup appointment with the Coumadin Clinic, he has appointment scheduled with Dr. Leone Payor on January 06, 2011.  Dr. Graciela Husbands did not make a specific appointment to see the patient back in the office but the patient was advised to discuss any return office visit with Dr. Graciela Husbands when he went to his Coumadin Clinic visit.  He was discharged in stable condition.  MEDICATIONS AT DISCHARGE: 1. Coumadin 7.5 mg Tuesday, Thursdays, Saturdays, 5 mg Monday,     Wednesdays, Fridays.  He is to restart Coumadin on Monday January 06, 2011. 2. Glucosamine/chondroitin 500/400 mg 1 capsule twice daily. 3. Lisinopril 20 mg once daily. 4. Metoprolol 25 mg 1-1/2 tablet daily. 5. Multivitamins 1 tablet daily. 6. Simvastatin 40 mg  p.o. at bedtime. 7. Tramadol 50 mg 1 tablet every 4 hours p.r.n. pain.  The patient was not started on any new medications during this admission.     Jennye Moccasin, PA-C   ______________________________ Venita Lick. Russella Dar, MD, Clementeen Graham    SG/MEDQ  D:  01/14/2011  T:  01/15/2011  Job:  [id number]  Electronically Signed by Jennye Moccasin PA-C on 01/15/2011 03:10:53 PM Electronically Signed by Claudette Head MD FACG on 01/18/2011 09:19:08 PM

## 2011-01-22 ENCOUNTER — Encounter: Payer: Self-pay | Admitting: Physician Assistant

## 2011-01-22 ENCOUNTER — Ambulatory Visit (INDEPENDENT_AMBULATORY_CARE_PROVIDER_SITE_OTHER): Payer: Medicare Other | Admitting: *Deleted

## 2011-01-22 ENCOUNTER — Ambulatory Visit (INDEPENDENT_AMBULATORY_CARE_PROVIDER_SITE_OTHER): Payer: Medicare Other | Admitting: Physician Assistant

## 2011-01-22 VITALS — BP 108/64 | HR 74 | Ht 67.0 in | Wt 202.1 lb

## 2011-01-22 DIAGNOSIS — I6350 Cerebral infarction due to unspecified occlusion or stenosis of unspecified cerebral artery: Secondary | ICD-10-CM

## 2011-01-22 DIAGNOSIS — I4892 Unspecified atrial flutter: Secondary | ICD-10-CM

## 2011-01-22 DIAGNOSIS — I639 Cerebral infarction, unspecified: Secondary | ICD-10-CM

## 2011-01-22 DIAGNOSIS — I2510 Atherosclerotic heart disease of native coronary artery without angina pectoris: Secondary | ICD-10-CM

## 2011-01-22 MED ORDER — ENOXAPARIN SODIUM 150 MG/ML ~~LOC~~ SOLN: 150.0000 mg | Freq: Every day | SUBCUTANEOUS | Status: DC

## 2011-01-22 NOTE — Assessment & Plan Note (Signed)
He had a normal Myoview in 1/11.  He is not having any unstable cardiac conditions.  I discussed his case today with Dr. Graciela Husbands.  We do not feel that he needs any further cardiovascular testing prior to his noncardiac procedure and he should be at acceptable risk.  He does need Lovenox bridging while off of Coumadin.  Our service will be available in the perioperative period as necessary.

## 2011-01-22 NOTE — Patient Instructions (Signed)
8/15- Coumadin 5mg   8/16- Coumadin 7.5mg   8/17- Coumadin 5mg  8/18-Last dose of Coumadin- 7.5mg  8/19-No Coumadin or Lovenox 8/20- Lovenox 100mg  in AM and PM 8/21- Lovenox 150mg  in AM 8/22- Lovenox 150mg  in AM 8/23- Day of procedure.  Restart Coumadin and Lovenox when okay with MD.  Take 2 tablets of Coumadin x 2 days then resume normal dose and restart Lovenox 150mg  once daily.   8/27- Recheck INR

## 2011-01-22 NOTE — Patient Instructions (Signed)
Your physician recommends that you schedule a follow-up appointment in: 03/2011 TO SEE DR. KLEIN AS PER SCOTT WEAVER, PA-C

## 2011-01-22 NOTE — Consult Note (Signed)
[name], [name] NO.:  [id number]  MEDICAL RECORD NO.:  [id number]  LOCATION:  4508                         FACILITY:  [facility]  PHYSICIAN:  Vesta Mixer, M.D. DATE OF BIRTH:  January 01, 1934  DATE OF CONSULTATION:  12/31/2010 DATE OF DISCHARGE:                                CONSULTATION   PRIMARY CARE PHYSICIAN:  Tera Mater. Clent Ridges, MD  PRIMARY CARDIOLOGIST:  Duke Salvia, MD, [facility]  CHIEF COMPLAINT:  Anticoagulation.  HISTORY OF PRESENT ILLNESS:  [name] is a 77 year old male with a history of atrial fibrillation.  He had a colonoscopy on approximately December 17, 2010, with polypectomy.  He was admitted on December 23, 2010, with a lower GI bleed.  He had a repeat colonoscopy with epinephrine injection at the polypectomy site and endoscopy.  After discharge, he had some constipation.  He had a bloody bowel movements and was readmitted on December 30, 2010 with an INR of 1.74.  Today, [name] still has some blood in his bowel movements, but otherwise is asymptomatic and feels well.  He has not had any recent palpitations or documented atrial fibrillation.  PAST MEDICAL HISTORY: 1. History of atrial flutter status, post cardioversion in December     2011 as well as April 2010. 2. Status post aortocoronary bypass surgery in 1989 with redo bypass     surgery in 2007 with a mitral valve annuloplasty ring.  He had a     SVG to PDA and SVG to D1 as well as SVG to LAD.  The LIMA from the     original bypass surgery was occluded. 3. History of prostate cancer. 4. History of spinal stenosis. 5. Dyslipidemia. 6. Hypertension. 7. History of retinal artery occlusion in January 2012, INR 2.09 at     that time. 8. Chronic anticoagulation with Coumadin. 9. Status post TEE prior to cardioversion in 2010 showing an EF of 55-     60% with no regional wall motion abnormalities and mild MR with the     annular brain prosthesis well seated.  SURGICAL HISTORY:  He is status  post cardiac catheterizations as well as bypass surgery x2 with mitral valve repair, prostate surgery, and hernia repair.  ALLERGIES:  NIACIN, which interacts with Coumadin.  CURRENT MEDICATIONS: 1. Colace 100 mg b.i.d. 2. Lopressor 12.5 mg a day. 3. Benicar 10 mg a day. 4. Zocor 40 mg a day. 5. Half-normal saline at 75 mL an hour.  SOCIAL HISTORY:  [place]. He and his wife have adopted his granddaughter whom his daughter originally adopted from [place] prior to her death.  He quit tobacco more than 30 years ago and denies alcohol or drug abuse.  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  FAMILY HISTORY:  His mother died at 71 with a stroke and his father died at 32 of an MI.  No siblings are known to have coronary artery disease.  REVIEW OF SYSTEMS:  The patient denies any symptoms other than the blood in his bowel movement.  Therefore, full 14-point review of systems is otherwise  negative except as stated in the HPI.  PHYSICAL EXAMINATION:  VITAL SIGNS:  Temperature is 97.4, blood pressure 112/76, heart rate between 62 and 100, respiratory rate 20, O2 saturation 99% on room air. GENERAL:  He is a well-developed, elderly, white male in no acute distress at rest. HEENT:  Normal for age. NECK:  There is no lymphadenopathy, thyromegaly, bruit, or JVD noted. CV:  His heart is regular in rate and rhythm with an S1-S2 and a soft systolic murmur is noted.  Distal pulses are intact in all four extremities. Lungs:  Essentially clear to auscultation bilaterally. SKIN:  No rashes or lesions are noted. ABDOMEN:  Soft and nontender with active bowel sounds. EXTREMITIES:  There is no cyanosis, clubbing, or edema noted. MUSCULOSKELETAL:  There is no joint deformity or effusions and no spine or CVA tenderness. NEUROLOGIC:  He is alert and oriented with no focal deficits noted except for blindness in the  left eye.  LABORATORY VALUES:  Hemoglobin 10.4, hematocrit 30.1, WBCs 5.7, platelets 162.  INR 1.65.  Sodium 135, potassium 4.1, chloride 100, CO2 28, BUN 19, creatinine 1.02, glucose 90.  Abdominal x-ray showed no bowel obstruction and a surgical clip in the region of the cecum  IMPRESSION:  [name] was seen today by Dr. Elease Hashimoto, the patient evaluated and the data reviewed.  The situation was discussed with Dr. Russella Dar and Dr. [place].  We have no option, but to continue to hold Coumadin.  GI recommends 1 more week.  This week, his risk of cerebrovascular accident is low.  PLAN:  Begin Coumadin next Monday or Tuesday if he has stopped bleeding.     Theodore Demark, PA-C   ______________________________ Vesta Mixer, M.D.    RB/MEDQ  D:  12/31/2010  T:  01/01/2011  Job:  [id number]  Electronically Signed by Theodore Demark PA-C on 01/14/2011 06:35:00 AM Electronically Signed by Kristeen Miss M.D. on 01/22/2011 05:35:08 PM

## 2011-01-22 NOTE — Assessment & Plan Note (Signed)
He will need Lovenox bridging while off Coumadin for his surgery.  His Lovenox can be resumed postoperatively when felt to be safe by the surgeon.  Our Coumadin clinic will help dos his Lovenox for him.

## 2011-01-22 NOTE — Progress Notes (Signed)
History of Present Illness: Primary Electrophysiologist:  Dr. Sherryl Manges   [name] is a 77 y.o. male who presents for surgical clearance.  He has a history of CAD, status post CABG in 1989 and redo bypass in 2007 with mitral valve repair.  The grafts included S-PDA, S-D1, S-LAD (LIMA from 1st CABG occluded).  He also has a history of atrial flutter status post prior cardioversion.  He suffered a retinal artery occlusion in January 2012 in the setting of therapeutic INR.  He is on chronic Coumadin.  He has hypertension, hyperlipidemia.  He recently underwent colon polyp removal.  He was covered with bridging Lovenox.  Unfortunately, he suffered a lower GI bleed afterward.  He was hospitalized twice.  Coumadin eventually had to be held.  His bleeding has since stopped and he is back on Coumadin therapy.  He now has a medial meniscal tear and needs arthroscopy on the left.  This is with Dr. Mckinley Jewel.  His last echocardiogram was 5/10: EF 55-60%, mitral valve repair okay, mild MR, PASP 40.  His last Myoview was 1/11: EF 69%, normal perfusion.  He denies chest pain.  He has chronic dyspnea with exertion without significant change.  He was riding a bike for exercise up until about 2-3 months ago without significant limitation.  He denies syncope or near-syncope.  He denies orthopnea, PND or edema.  He denies palpitations.  Past Medical History  Diagnosis Date  . Chickenpox   . Prostate cancer   . Blood transfusion abn reaction or complication, no procedure mishap   . History of colonic polyps     adenomatous  . Coronary artery disease   . Normal cardiac stress test   . Hyperlipidemia   . Hypertension   . Urinary incontinence   . Atrial flutter   . Onychomycosis   . Central retinal artery occlusion   . CVA (cerebral infarction)   . Atrial fibrillation   . History of blood clots   . GI bleed     after colonoscopy/polyp removal    Current Outpatient Prescriptions  Medication Sig  Dispense Refill  . ferrous sulfate 325 (65 FE) MG tablet Take 1 tablet (325 mg total) by mouth daily.  30 tablet  3  . glucosamine-chondroitin 500-400 MG tablet Take 1 tablet by mouth daily.        . irbesartan (AVAPRO) 75 MG tablet Take 75 mg by mouth at bedtime.        Marland Kitchen lisinopril (PRINIVIL,ZESTRIL) 20 MG tablet       . metoprolol tartrate (LOPRESSOR) 25 MG tablet Take by mouth. Take 1/2 tab by mouth once daily        . Multiple Vitamin (MULTIVITAMIN) capsule Take 1 capsule by mouth daily.        . simvastatin (ZOCOR) 40 MG tablet Take 40 mg by mouth at bedtime.        . traMADol (ULTRAM) 50 MG tablet Take 50 mg by mouth as needed.       . warfarin (COUMADIN) 5 MG tablet Take as directed by Anticoagulation clinic   40 tablet  3    Allergies: Allergies  Allergen Reactions  . Niacin     contraindication    Social history  Nonsmoker  ROS:  Please see the history of present illness.  All other systems reviewed and negative.   Vital Signs: BP 108/64  Pulse 74  PHYSICAL EXAM: Well nourished, well developed, in no acute distress HEENT: normal Neck:  no JVD Vascular: No carotid bruits Cardiac:  normal S1, S2; RRR; no murmur Lungs:  clear to auscultation bilaterally, no wheezing, rhonchi or rales Abd: soft, nontender, no hepatomegaly Ext: no edema Skin: warm and dry Neuro:  CNs 2-12 intact, no focal abnormalities noted Psych: Normal affect  EKG:  Sinus rhythm, heart rate 74, leftward axis and, no ischemic changes  ASSESSMENT AND PLAN:

## 2011-01-26 NOTE — Discharge Summary (Signed)
NAMEMARCIO, [name] NO.:  [id number]  MEDICAL RECORD NO.:  [id number]  LOCATION:  3714                         FACILITY:  [facility]  PHYSICIAN:  Hedwig Morton. Juanda Chance, MD     DATE OF BIRTH:  10/28/1933  DATE OF ADMISSION:  12/23/2010 DATE OF DISCHARGE:  12/25/2010                        DISCHARGE SUMMARY - REFERRING   DISPOSITION:  Home in stable condition.  DISCHARGE MEDICATIONS: 1. Lovenox 90 mcg subcu twice daily. 2. Aspercreme OTC three times daily as needed. 3. Avapro 150 kg one-half tablet daily. 4. Coumadin 7.5 mg for the next three days, then resume previous dose     consisting of 7.5 mg on Sunday, Wednesday, and Friday and 5 mg     daily every other day. 5. Glucosamine 1-2 capsules by mouth daily. 6. Metoprolol 25 mg one-half tablet daily. 7. Multiple vitamins. 8. Simvastatin 40 mg 1 tablet daily at bedtime. 9. Ultram 50 mg 1 tablet every 4 hours as needed.  CONSULTATIONS:  [facility].  PROCEDURES:  Colonoscopy by Dr. Lina Sar.  DISCHARGE DIAGNOSES: 1. Post-polypectomy bleed in the setting of anticoagulation, resolved.     The patient had repeat colonoscopy this admission by Dr. Lina Sar, which showed adherent blood clot without active bleeding.     Status post epinephrine injection at polypectomy site and     endoclipping. 2. History of central retinal artery occlusion. 3. History of cerebrovascular accident. 4. Coronary artery disease. 5. Hyperlipidemia. 6. History of colon polyps. 7. Hypertension. 8. Atrial flutter.  HOSPITAL COURSE:  [name] is a 77 year old male, who was recently seen by Dr. Leone Payor for surveillance colonoscopy for history of colon polyps. 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  In preparation for the surveillance colonoscopy, the patient's home Coumadin was held and he was started on Lovenox under the direction  of his cardiologist, Dr. Graciela Husbands.  On December 17, 2010, the patient had colonoscopy with removal of 4 polyps, 3 in the ascending colon and 1 in the transverse colon.  One of the descending colon polyps was 15 mm in size and it was removed by hot snare piecemeal fashion.  Approximately 6 days out from polypectomy, the patient called our office with complaints of rectal bleeding and he was admitted to [facility] for further evaluation and treatment.  Upon arrival to the hospital, the patient was given a bowel prep to see if the bleeding cleared and also indicates that he would need repeat colonoscopy the following morning.  His admitting white count was normal at 7, hemoglobin 12.0.  Outpatient INR on the day of admission was 2.  [name]'s rectal bleeding for the most part had resolved the following morning, but colonoscopy was continued as planned to help guide anticoagulation.  The patient was found to have a large adherent clot at the polypectomy site in the right colon.  No active bleeding was seen.  The site was injected with epinephrine and 2 EndoClips applied to the base of the large clot.  Throughout this admission, [name]'s hemoglobin remained stable, he did not require a  blood transfusion.  Hemoglobin nadir 11.6.  The patient was seen by Cardiology to help guide anticoagulation.  He was discharged home with above medications and plans to follow up with the [facility] (I spoke with [name] at the [facility]).  The patient would call our office immediately if he had any further bleeding.  Our office would call him with a day in time to follow up in the office with Dr. Leone Payor.     [name], NP   ______________________________ Hedwig Morton. Juanda Chance, MD    PG/MEDQ  D:  12/30/2010  T:  12/30/2010  Job:  [id number]  Electronically Signed by [name] NP on 01/15/2011 91:47:82 PM Electronically Signed by Lina Sar MD on 01/26/2011 08:02:31 AM

## 2011-01-29 ENCOUNTER — Encounter (HOSPITAL_BASED_OUTPATIENT_CLINIC_OR_DEPARTMENT_OTHER)
Admission: RE | Admit: 2011-01-29 | Discharge: 2011-01-29 | Disposition: A | Discharge status: Home / Self Care | Payer: Medicare Other | Source: Ambulatory Visit | Attending: Orthopedic Surgery | Admitting: Orthopedic Surgery

## 2011-01-29 ENCOUNTER — Ambulatory Visit
Admission: RE | Admit: 2011-01-29 | Discharge: 2011-01-29 | Disposition: A | Discharge status: Home / Self Care | Payer: Medicare Other | Source: Ambulatory Visit | Attending: Orthopedic Surgery | Admitting: Orthopedic Surgery

## 2011-01-29 ENCOUNTER — Other Ambulatory Visit: Payer: Self-pay | Admitting: Orthopedic Surgery

## 2011-01-29 DIAGNOSIS — Z01811 Encounter for preprocedural respiratory examination: Secondary | ICD-10-CM

## 2011-01-29 LAB — BASIC METABOLIC PANEL
BUN: 14 mg/dL (ref 6–23)
CO2: 30 mEq/L (ref 19–32)
Chloride: 102 mEq/L (ref 96–112)
Glucose, Bld: 102 mg/dL — ABNORMAL HIGH (ref 70–99)
Potassium: 4.5 mEq/L (ref 3.5–5.1)

## 2011-01-29 LAB — PROTIME-INR: Prothrombin Time: 15 seconds (ref 11.6–15.2)

## 2011-01-30 ENCOUNTER — Ambulatory Visit (HOSPITAL_BASED_OUTPATIENT_CLINIC_OR_DEPARTMENT_OTHER)
Admission: RE | Admit: 2011-01-30 | Discharge: 2011-01-30 | Disposition: A | Discharge status: Home / Self Care | Payer: Medicare Other | Source: Ambulatory Visit | Attending: Orthopedic Surgery | Admitting: Orthopedic Surgery

## 2011-01-30 DIAGNOSIS — Z01812 Encounter for preprocedural laboratory examination: Secondary | ICD-10-CM | POA: Insufficient documentation

## 2011-01-30 DIAGNOSIS — M23359 Other meniscus derangements, posterior horn of lateral meniscus, unspecified knee: Secondary | ICD-10-CM | POA: Insufficient documentation

## 2011-01-30 DIAGNOSIS — M23302 Other meniscus derangements, unspecified lateral meniscus, unspecified knee: Secondary | ICD-10-CM | POA: Insufficient documentation

## 2011-01-30 DIAGNOSIS — M2340 Loose body in knee, unspecified knee: Secondary | ICD-10-CM | POA: Insufficient documentation

## 2011-01-30 DIAGNOSIS — I2510 Atherosclerotic heart disease of native coronary artery without angina pectoris: Secondary | ICD-10-CM | POA: Insufficient documentation

## 2011-01-30 DIAGNOSIS — Z7901 Long term (current) use of anticoagulants: Secondary | ICD-10-CM | POA: Insufficient documentation

## 2011-01-30 DIAGNOSIS — M23305 Other meniscus derangements, unspecified medial meniscus, unspecified knee: Secondary | ICD-10-CM | POA: Insufficient documentation

## 2011-01-30 NOTE — Op Note (Signed)
  NAMEMAHESH, [name] NO.:  [id number]  MEDICAL RECORD NO.:  [id number]  LOCATION:                                 FACILITY:  PHYSICIAN:  Loreta Ave, M.D. DATE OF BIRTH:  [birth year]  DATE OF PROCEDURE:  01/30/2011 DATE OF DISCHARGE:                              OPERATIVE REPORT   PREOPERATIVE DIAGNOSIS:  Left knee medial meniscus tear with medial compartment arthritis.  POSTOPERATIVE DIAGNOSES:  Left knee extensive tearing of medial meniscus.  Flap tear and meniscal cyst, posterior horn of lateral meniscus.  Diffuse grade 3 changes of patellofemoral joint with grade 4 changes of medial compartment with chondral loose bodies.  PROCEDURES:  Left knee exam under anesthesia, arthroscopy.  Partial medial and lateral meniscectomies, debridement of meniscal cyst. Tricompartmental chondroplasty including patellofemoral joint.  SURGEON:  Loreta Ave, MD  ASSISTANT:  Skip Mayer, PA  ANESTHESIA:  General.  BLOOD LOSS:  Minimal.  SPECIMENS:  None.  CULTURES:  None.  COMPLICATIONS:  None.  DRESSING:  Sterile compressive.  TOURNIQUET:  Not employed.  PROCEDURE:  The patient was brought to the operating room and placed on the operating table in supine position.  After adequate anesthesia had been obtained, leg holder applied.  Leg prepped and draped in usual sterile fashion.  Two portals, one each medial and lateral parapatellar. Arthroscope was induced.  Knee distended and inspected.  Grade 3 changes on the patella debrided.  Grade 3 and 4 medial compartment debrided. Cruciate ligament was intact.  Patellar tracking looked good.  Lateral meniscus intact except for a flap tear on the back with a small meniscal cyst.  Excised, tapered into remaining meniscus.  Lateral compartment otherwise did not look bad.  Medial meniscus marked.  Extensive complex tearing of middle and posterior third.  Taken all the way out to the rim, tapered into  remaining meniscus.  Unfortunately, grade 3 and 4 changes over most of that compartment was on the femur than on the tibia.  Debrided to a stable surface.  Debris cleared throughout. Entire knee was examined and no other findings appreciated.  Instruments and fluid were removed. Portals of the knee were injected with Marcaine.  Portals were closed with 4-0 nylon.  Sterile compressive dressing applied.  Anesthesia reversed.  Brought to the recovery room.  Tolerated the surgery well. No complications.     Loreta Ave, M.D.     DFM/MEDQ  D:  01/30/2011  T:  01/30/2011  Job:  [id number]  Electronically Signed by Mckinley Jewel M.D. on 01/30/2011 04:16:10 PM

## 2011-01-31 ENCOUNTER — Emergency Department (HOSPITAL_COMMUNITY): Payer: Medicare Other

## 2011-01-31 ENCOUNTER — Emergency Department (HOSPITAL_COMMUNITY)
Admission: EM | Admit: 2011-01-31 | Discharge: 2011-01-31 | Disposition: A | Discharge status: Home / Self Care | Payer: Medicare Other | Attending: Emergency Medicine | Admitting: Emergency Medicine

## 2011-01-31 DIAGNOSIS — Z9889 Other specified postprocedural states: Secondary | ICD-10-CM | POA: Insufficient documentation

## 2011-01-31 DIAGNOSIS — I4891 Unspecified atrial fibrillation: Secondary | ICD-10-CM | POA: Insufficient documentation

## 2011-01-31 DIAGNOSIS — Z7901 Long term (current) use of anticoagulants: Secondary | ICD-10-CM | POA: Insufficient documentation

## 2011-01-31 DIAGNOSIS — E7800 Pure hypercholesterolemia, unspecified: Secondary | ICD-10-CM | POA: Insufficient documentation

## 2011-01-31 DIAGNOSIS — I10 Essential (primary) hypertension: Secondary | ICD-10-CM | POA: Insufficient documentation

## 2011-01-31 DIAGNOSIS — M25569 Pain in unspecified knee: Secondary | ICD-10-CM | POA: Insufficient documentation

## 2011-01-31 DIAGNOSIS — Z951 Presence of aortocoronary bypass graft: Secondary | ICD-10-CM | POA: Insufficient documentation

## 2011-01-31 DIAGNOSIS — M25469 Effusion, unspecified knee: Secondary | ICD-10-CM | POA: Insufficient documentation

## 2011-01-31 DIAGNOSIS — Z954 Presence of other heart-valve replacement: Secondary | ICD-10-CM | POA: Insufficient documentation

## 2011-01-31 LAB — CBC
HCT: 32.9 % — ABNORMAL LOW (ref 39.0–52.0)
Hemoglobin: 11.6 g/dL — ABNORMAL LOW (ref 13.0–17.0)
MCH: 30.9 pg (ref 26.0–34.0)
MCHC: 35.3 g/dL (ref 30.0–36.0)
MCV: 87.7 fL (ref 78.0–100.0)
RDW: 13.5 % (ref 11.5–15.5)

## 2011-01-31 LAB — DIFFERENTIAL
Basophils Absolute: 0 10*3/uL (ref 0.0–0.1)
Eosinophils Relative: 0 % (ref 0–5)
Lymphocytes Relative: 8 % — ABNORMAL LOW (ref 12–46)
Lymphs Abs: 1 10*3/uL (ref 0.7–4.0)
Monocytes Absolute: 0.8 10*3/uL (ref 0.1–1.0)
Monocytes Relative: 6 % (ref 3–12)

## 2011-01-31 LAB — BASIC METABOLIC PANEL
BUN: 15 mg/dL (ref 6–23)
Calcium: 8.7 mg/dL (ref 8.4–10.5)
Creatinine, Ser: 0.75 mg/dL (ref 0.50–1.35)
GFR calc Af Amer: >60 mL/min (ref >60)
GFR calc non Af Amer: >60 mL/min (ref >60)
Glucose, Bld: 116 mg/dL — ABNORMAL HIGH (ref 70–99)

## 2011-02-03 ENCOUNTER — Encounter: Payer: Medicare Other | Admitting: *Deleted

## 2011-02-04 ENCOUNTER — Encounter: Payer: Medicare Other | Admitting: *Deleted

## 2011-02-05 ENCOUNTER — Ambulatory Visit (INDEPENDENT_AMBULATORY_CARE_PROVIDER_SITE_OTHER): Payer: Medicare Other | Admitting: *Deleted

## 2011-02-05 DIAGNOSIS — I4892 Unspecified atrial flutter: Secondary | ICD-10-CM

## 2011-02-05 DIAGNOSIS — I6350 Cerebral infarction due to unspecified occlusion or stenosis of unspecified cerebral artery: Secondary | ICD-10-CM

## 2011-02-05 DIAGNOSIS — I639 Cerebral infarction, unspecified: Secondary | ICD-10-CM

## 2011-02-06 ENCOUNTER — Ambulatory Visit: Payer: Medicare Other | Admitting: Internal Medicine

## 2011-02-14 ENCOUNTER — Ambulatory Visit (INDEPENDENT_AMBULATORY_CARE_PROVIDER_SITE_OTHER): Payer: Medicare Other | Admitting: *Deleted

## 2011-02-14 DIAGNOSIS — I639 Cerebral infarction, unspecified: Secondary | ICD-10-CM

## 2011-02-14 DIAGNOSIS — I6350 Cerebral infarction due to unspecified occlusion or stenosis of unspecified cerebral artery: Secondary | ICD-10-CM

## 2011-02-14 DIAGNOSIS — I4892 Unspecified atrial flutter: Secondary | ICD-10-CM

## 2011-03-03 ENCOUNTER — Other Ambulatory Visit: Payer: Self-pay | Admitting: *Deleted

## 2011-03-03 LAB — BASIC METABOLIC PANEL
BUN: 14
Chloride: 103
GFR calc non Af Amer: 55 — ABNORMAL LOW
Glucose, Bld: 118 — ABNORMAL HIGH
Potassium: 4.2
Sodium: 135

## 2011-03-03 LAB — CBC
MCHC: 35.9
RBC: 4.37

## 2011-03-03 LAB — TYPE AND SCREEN: Antibody Screen: NEGATIVE

## 2011-03-03 LAB — PROTIME-INR
INR: 0.9
Prothrombin Time: 12.4

## 2011-03-06 ENCOUNTER — Other Ambulatory Visit: Payer: Self-pay

## 2011-03-06 MED ORDER — METOPROLOL TARTRATE 25 MG PO TABS: 25.0000 mg | ORAL_TABLET | Freq: Two times a day (BID) | ORAL | Status: DC

## 2011-03-07 ENCOUNTER — Telehealth: Payer: Self-pay | Admitting: Internal Medicine

## 2011-03-07 NOTE — Telephone Encounter (Signed)
Pt wants refill metoprolol  To [facility]

## 2011-03-10 MED ORDER — METOPROLOL TARTRATE 25 MG PO TABS: ORAL_TABLET | ORAL | Status: DC

## 2011-03-11 ENCOUNTER — Other Ambulatory Visit: Payer: Self-pay | Admitting: Internal Medicine

## 2011-03-11 ENCOUNTER — Ambulatory Visit (INDEPENDENT_AMBULATORY_CARE_PROVIDER_SITE_OTHER): Payer: Medicare Other | Admitting: Internal Medicine

## 2011-03-11 ENCOUNTER — Ambulatory Visit (INDEPENDENT_AMBULATORY_CARE_PROVIDER_SITE_OTHER): Payer: Medicare Other | Admitting: *Deleted

## 2011-03-11 ENCOUNTER — Encounter: Payer: Self-pay | Admitting: Internal Medicine

## 2011-03-11 ENCOUNTER — Telehealth: Payer: Self-pay | Admitting: Internal Medicine

## 2011-03-11 DIAGNOSIS — I2510 Atherosclerotic heart disease of native coronary artery without angina pectoris: Secondary | ICD-10-CM

## 2011-03-11 DIAGNOSIS — I4892 Unspecified atrial flutter: Secondary | ICD-10-CM

## 2011-03-11 DIAGNOSIS — I6350 Cerebral infarction due to unspecified occlusion or stenosis of unspecified cerebral artery: Secondary | ICD-10-CM

## 2011-03-11 DIAGNOSIS — I10 Essential (primary) hypertension: Secondary | ICD-10-CM

## 2011-03-11 DIAGNOSIS — I639 Cerebral infarction, unspecified: Secondary | ICD-10-CM

## 2011-03-11 LAB — POCT INR: INR: 2.5

## 2011-03-11 MED ORDER — METOPROLOL TARTRATE 25 MG PO TABS: 12.5000 mg | ORAL_TABLET | Freq: Two times a day (BID) | ORAL | Status: DC

## 2011-03-11 NOTE — Assessment & Plan Note (Signed)
As above.

## 2011-03-11 NOTE — Telephone Encounter (Signed)
Pt was told to stop avapro but that was changed to lisinopril a couple months ago so he wants to know should he stop the lisinopril

## 2011-03-11 NOTE — Assessment & Plan Note (Signed)
The patient has had intermittent episodes of atrial arrhythmia. We will continue him on his warfarin. He had problems with bleeding at the time of his colonoscopy. I will review a plan with Dr.Putt regarding anticoagulation at the time of his next procedure

## 2011-03-11 NOTE — Telephone Encounter (Signed)
Per Dr. Graciela Husbands - continue lisinopril. The patient is aware.

## 2011-03-11 NOTE — Progress Notes (Signed)
  HPI  [name] is a 77 y.o. male  seen in followup of atrial flutter and a recent stroke manifesting as central retinal artery occlusion.  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  The patient denies chest pain, shortness of breath, nocturnal dyspnea, orthopnea or peripheral edema.  There have been no palpitations, lightheadedness or syncope.       Past Medical History  Diagnosis Date  . Chickenpox   . Prostate cancer   . Blood transfusion abn reaction or complication, no procedure mishap   . History of colonic polyps   . Coronary artery disease   . Normal cardiac stress test   . Hyperlipidemia   . Hypertension   . Urinary incontinence     Past Surgical History  Procedure Date  . Heart bypass   . Repeat heart bypass 2007    and mitral valve replacement   . Bilateral inguinal hernia repairs 1993  . Radical prostatectomy with radiation   . Ablation for atrial fibrillation   . Installation of blader pump 2011    dr. McDiarmid    Current Outpatient Prescriptions  Medication Sig Dispense Refill  . glucosamine-chondroitin 500-400 MG tablet Take 1 tablet by mouth daily.        . irbesartan (AVAPRO) 75 MG tablet Take 75 mg by mouth daily.        . metoprolol tartrate (LOPRESSOR) 25 MG tablet Take by mouth. Take 1/2 tab by mouth once daily        . Multiple Vitamin (MULTIVITAMIN) capsule Take 1 capsule by mouth daily.        Marland Kitchen RESVERATROL 100 MG CAPS Take by mouth daily.        . simvastatin (ZOCOR) 40 MG tablet Take 40 mg by mouth at bedtime.        Marland Kitchen warfarin (COUMADIN) 5 MG tablet Take by mouth as directed.          Allergies  Allergen Reactions  . Niacin     Review of Systems negative except from HPI and PMH  Physical Exam Well developed and well nourished  in no acute distress HENT normal E scleral and icterus clear Neck Supple JVP flat; carotids brisk and full Clear to ausculation Regular rate and rhythm, no murmurs gallops or rub Soft with active bowel sounds No clubbing cyanosis and edema Alert and oriented, grossly normal motor and sensory function Skin Warm and Dry  Assessment and  Plan

## 2011-03-11 NOTE — Assessment & Plan Note (Signed)
I will plan to discontinue his Avapro and increase his Lopressor to b.i.d. He will follow up with Dr. Clent Ridges next couple of weeks at rebound his blood pressure

## 2011-03-11 NOTE — Patient Instructions (Signed)
Medication change: Take Metoprolol Tartrate 25 mg 1/2 tablet twice a day STOP Avapro Please schedule a follow up appointment in 6 months.  The office will send you a reminder letter to schedule this.

## 2011-03-11 NOTE — Assessment & Plan Note (Signed)
Stable on current meds 

## 2011-03-14 LAB — CBC
HCT: 37.2 — ABNORMAL LOW
Hemoglobin: 12.9 — ABNORMAL LOW
MCV: 89.4
Platelets: 181
RBC: 4 — ABNORMAL LOW
RDW: 13
WBC: 7.6
WBC: 9.6

## 2011-03-14 LAB — BASIC METABOLIC PANEL
CO2: 29
Chloride: 101
Glucose, Bld: 122 — ABNORMAL HIGH
Potassium: 4
Sodium: 135

## 2011-03-14 LAB — DIFFERENTIAL
Basophils Relative: 0
Eosinophils Absolute: 0.1 — ABNORMAL LOW
Lymphs Abs: 0.9
Monocytes Relative: 6
Neutro Abs: 7.9 — ABNORMAL HIGH
Neutrophils Relative %: 83 — ABNORMAL HIGH

## 2011-03-14 LAB — ANAEROBIC CULTURE

## 2011-03-17 ENCOUNTER — Telehealth: Payer: Self-pay | Admitting: *Deleted

## 2011-03-17 LAB — DIFFERENTIAL
Eosinophils Relative: 1
Lymphocytes Relative: 16
Lymphs Abs: 1.3
Monocytes Absolute: 0.7
Monocytes Relative: 8

## 2011-03-17 LAB — CBC
HCT: 41
Hemoglobin: 13.8
MCV: 89.3
MCV: 91.8
Platelets: 176
WBC: 7.9
WBC: 8.4

## 2011-03-17 LAB — BASIC METABOLIC PANEL
BUN: 16
Calcium: 9
Chloride: 104
Chloride: 105
Creatinine, Ser: 0.85
GFR calc non Af Amer: >60
GFR calc non Af Amer: >60
Potassium: 4.4
Sodium: 140

## 2011-03-17 LAB — URINE CULTURE
Colony Count: 25000
Colony Count: NO GROWTH

## 2011-03-17 LAB — CULTURE, BLOOD (ROUTINE X 2): Culture: NO GROWTH

## 2011-03-17 NOTE — Telephone Encounter (Signed)
Pt has been having bad insomnia, and used one of his son's "Xanax 1 mg. Which helped tremendously.  Would like a prescription sent to  His pharmacy.

## 2011-03-18 MED ORDER — ALPRAZOLAM 1 MG PO TABS: 1.0000 mg | ORAL_TABLET | Freq: Every evening | ORAL | Status: DC | PRN

## 2011-03-18 NOTE — Telephone Encounter (Signed)
Call in Xanax 1 mg to take qhs, #30 with 5 rf

## 2011-03-18 NOTE — Telephone Encounter (Signed)
I called in script and left voice message for pt. 

## 2011-04-04 ENCOUNTER — Ambulatory Visit (INDEPENDENT_AMBULATORY_CARE_PROVIDER_SITE_OTHER): Payer: Medicare Other | Admitting: *Deleted

## 2011-04-04 DIAGNOSIS — I639 Cerebral infarction, unspecified: Secondary | ICD-10-CM

## 2011-04-04 DIAGNOSIS — Z7901 Long term (current) use of anticoagulants: Secondary | ICD-10-CM

## 2011-04-04 DIAGNOSIS — I4892 Unspecified atrial flutter: Secondary | ICD-10-CM

## 2011-04-04 DIAGNOSIS — I6350 Cerebral infarction due to unspecified occlusion or stenosis of unspecified cerebral artery: Secondary | ICD-10-CM

## 2011-04-04 LAB — POCT INR: INR: 2.1

## 2011-04-25 ENCOUNTER — Encounter: Payer: Medicare Other | Admitting: *Deleted

## 2011-04-28 ENCOUNTER — Ambulatory Visit (INDEPENDENT_AMBULATORY_CARE_PROVIDER_SITE_OTHER): Payer: Medicare Other | Admitting: *Deleted

## 2011-04-28 DIAGNOSIS — I639 Cerebral infarction, unspecified: Secondary | ICD-10-CM

## 2011-04-28 DIAGNOSIS — Z7901 Long term (current) use of anticoagulants: Secondary | ICD-10-CM

## 2011-04-28 DIAGNOSIS — I6350 Cerebral infarction due to unspecified occlusion or stenosis of unspecified cerebral artery: Secondary | ICD-10-CM

## 2011-04-28 DIAGNOSIS — I4892 Unspecified atrial flutter: Secondary | ICD-10-CM

## 2011-04-29 ENCOUNTER — Other Ambulatory Visit: Payer: Self-pay | Admitting: *Deleted

## 2011-04-29 MED ORDER — WARFARIN SODIUM 5 MG PO TABS: ORAL_TABLET | ORAL | Status: DC

## 2011-05-07 ENCOUNTER — Telehealth: Payer: Self-pay | Admitting: Internal Medicine

## 2011-05-07 NOTE — Telephone Encounter (Signed)
Patient has many questions about the need for colon .  I have scheduled for an office visit for him to come in and discuss on 05/13/11

## 2011-05-12 ENCOUNTER — Ambulatory Visit (INDEPENDENT_AMBULATORY_CARE_PROVIDER_SITE_OTHER): Payer: Medicare Other | Admitting: *Deleted

## 2011-05-12 DIAGNOSIS — Z7901 Long term (current) use of anticoagulants: Secondary | ICD-10-CM

## 2011-05-12 DIAGNOSIS — I4892 Unspecified atrial flutter: Secondary | ICD-10-CM

## 2011-05-12 DIAGNOSIS — I639 Cerebral infarction, unspecified: Secondary | ICD-10-CM

## 2011-05-12 DIAGNOSIS — I6350 Cerebral infarction due to unspecified occlusion or stenosis of unspecified cerebral artery: Secondary | ICD-10-CM

## 2011-05-12 LAB — POCT INR: INR: 2.4

## 2011-05-13 ENCOUNTER — Ambulatory Visit (INDEPENDENT_AMBULATORY_CARE_PROVIDER_SITE_OTHER): Payer: Medicare Other | Admitting: Internal Medicine

## 2011-05-13 ENCOUNTER — Encounter: Payer: Self-pay | Admitting: Internal Medicine

## 2011-05-13 VITALS — BP 128/60 | HR 64 | Ht 67.0 in | Wt 203.0 lb

## 2011-05-13 DIAGNOSIS — I4891 Unspecified atrial fibrillation: Secondary | ICD-10-CM

## 2011-05-13 DIAGNOSIS — Z86010 Personal history of colonic polyps: Secondary | ICD-10-CM

## 2011-05-13 DIAGNOSIS — Z7901 Long term (current) use of anticoagulants: Secondary | ICD-10-CM

## 2011-05-13 DIAGNOSIS — IMO0002 Reserved for concepts with insufficient information to code with codable children: Secondary | ICD-10-CM

## 2011-05-13 NOTE — Progress Notes (Signed)
[name] [id number] Dec 18, 1933   The patient presents to discuss if followup colonoscopy. He had a colonoscopy and polypectomy in the summer, and one of the polyps return as having a focus of possible invasive carcinoma. This caused a great deal of emotional distress and is worried and quite a bit. On top of that, he had a post polypectomy bleed, in the setting of a Lovenox bridge because he has atrial flutter and fibrillation has a history of a stroke. He has done well in the interim no excessive knee arthroscopy in his walking with a cane.  Allergies  Allergen Reactions  . Niacin     contraindication   Outpatient Prescriptions Prior to Visit  Medication Sig Dispense Refill  . ALPRAZolam (XANAX) 1 MG tablet Take 1 tablet (1 mg total) by mouth at bedtime as needed for sleep.  30 tablet  5  . ferrous sulfate 325 (65 FE) MG tablet Take 1 tablet (325 mg total) by mouth daily.  30 tablet  3  . glucosamine-chondroitin 500-400 MG tablet Take 1 tablet by mouth daily.        Marland Kitchen lisinopril (PRINIVIL,ZESTRIL) 20 MG tablet Take 20 mg by mouth daily.       . metoprolol tartrate (LOPRESSOR) 25 MG tablet Take 0.5 tablets (12.5 mg total) by mouth 2 (two) times daily.  30 tablet  6  . Multiple Vitamin (MULTIVITAMIN) capsule Take 1 capsule by mouth daily.        . simvastatin (ZOCOR) 40 MG tablet Take 40 mg by mouth at bedtime.        . traMADol (ULTRAM) 50 MG tablet Take 50 mg by mouth as needed.       . warfarin (COUMADIN) 5 MG tablet Take as directed by coumadin clinic  40 tablet  3   Past Medical History  Diagnosis Date  . Chickenpox   . Prostate cancer   . Blood transfusion abn reaction or complication, no procedure mishap   . History of colonic polyps     adenomatous  . Coronary artery disease   . Normal cardiac stress test   . Hyperlipidemia   . Hypertension   . Urinary incontinence   . Atrial flutter   . Onychomycosis   . Central retinal artery occlusion   . CVA (cerebral  infarction)   . Atrial fibrillation   . History of blood clots   . GI bleed     after colonoscopy/polyp removal  . Anemia, posthemorrhagic, acute    Past Surgical History  Procedure Date  . Coronary artery bypass graft 1989    4 vessels  . Coronary artery bypass graft 2007    and mitral valve replacement   . Mitral valve replacement 2007  . Radical prostatectomy with radiation 2003  . A flutter ablation 2010  . Bladder pump installed 2011    Dr. McDiarmid  . Colonoscopy w/ biopsies and polypectomy 12/17/2010, 12/24/2010    adenomatous polyps, external hemorrhoids, repeat colonoscopy to control post-polypectomy bleed  . Inguinal hernia repair 1993    bilatera   History   Social History  . Marital Status: Married    Spouse Name: N/A    Number of Children: 3  . Years of Education: N/A   Occupational History  . retired Sport and exercise psychologist    Social History Main Topics  . Smoking status: Former Smoker    Quit date: 06/09/1977  . Smokeless tobacco: Never Used  . Alcohol Use: 0.6 oz/week    1  Glasses of wine per week     2 drinks per week  . Drug Use: No  . Sexually Active: None   Other Topics Concern  . None   Social History Narrative  . None   Family History  Problem Relation Age of Onset  . Alcohol abuse Mother   . Coronary artery disease Father   . Hypertension Mother   . Stroke Mother   . Cancer Daughter     renal cell cancer  . Colon cancer Neg Hx   . Diabetes Mother       Assessment/Plan:  A personal history of colon polyps, one with a focus of suspected invasive cancer. This polyp was probably removed but we don't know that for sure. He suffered post polypectomy bleeding after that procedure.  He also has a history of atrial fibrillation flutter and a history of a stroke - Central retinal artery occlusion. blindness. He is on chronic warfarin.  He remains concerned about the possibility of having colon cancer. We talked about the need for a followup  colonoscopy to check the area of polypectomy question. A 15 mm polyp. I told him that I had hoped to be able to see that area but there is no guarantee. He would prefer to avoid Lovenox in the future, despite the stroke risk. I have told him I will discuss this with Dr. Graciela Husbands and we will plan this out, with colonoscopy in January. He could need to clipping, if I didn't a snare polypectomy while on warfarin. I think performing the procedure on warfarin is reasonable, as the likelihood of needing hot snare his very unlikely, and that biopsying in the setting of warfarin is reasonable medical care.   20 minutes of time spent with the patient in counseling and coordination of care.

## 2011-05-13 NOTE — Patient Instructions (Signed)
I will discuss her situation with Dr. Graciela Husbands and we will contact you about having a colonoscopy in January. I will talk to him about how best to manage your anticoagulation around the time of your colonoscopy. Iva Boop, MD, [facility]

## 2011-05-22 ENCOUNTER — Telehealth: Payer: Self-pay | Admitting: Internal Medicine

## 2011-05-22 DIAGNOSIS — Z7901 Long term (current) use of anticoagulants: Secondary | ICD-10-CM

## 2011-05-22 DIAGNOSIS — C189 Malignant neoplasm of colon, unspecified: Secondary | ICD-10-CM

## 2011-05-22 DIAGNOSIS — K635 Polyp of colon: Secondary | ICD-10-CM

## 2011-05-22 NOTE — Telephone Encounter (Signed)
I called [name] after speaking to Dr. Graciela Husbands (cardiology). No one right answer re: anticoagulation management. Risk of stroke is overall quite small off anticoagulation but he is at higher risk than others given prior embolic event.  We have decided to perform colonoscopy at [facility] in January (during my hospital week) ON WARFARIN. Reason is colon cancer and colon polyp (checking for complete removal)  He needs a pre-visit and appointment for that arranged. I would like him to have an INR 1-2 days before the colonoscopy also (re: warfarin therapy).

## 2011-05-23 ENCOUNTER — Other Ambulatory Visit: Payer: Self-pay

## 2011-05-23 DIAGNOSIS — C189 Malignant neoplasm of colon, unspecified: Secondary | ICD-10-CM

## 2011-05-23 DIAGNOSIS — K635 Polyp of colon: Secondary | ICD-10-CM

## 2011-05-23 NOTE — Telephone Encounter (Signed)
Addended by: Annett Fabian on: 05/23/2011 10:03 AM   Modules accepted: Orders

## 2011-05-23 NOTE — Telephone Encounter (Signed)
Patient is scheduled for 06/23/11 0830 [facility].  I have left a message for him to call back and discuss

## 2011-05-26 NOTE — Telephone Encounter (Signed)
Patient aware of of appt for 06/23/11 and 06/16/11.  He is aware to come for PT/INR on 06/20/11

## 2011-06-05 ENCOUNTER — Telehealth: Payer: Self-pay | Admitting: Internal Medicine

## 2011-06-05 DIAGNOSIS — Z79899 Other long term (current) drug therapy: Secondary | ICD-10-CM

## 2011-06-05 DIAGNOSIS — E782 Mixed hyperlipidemia: Secondary | ICD-10-CM

## 2011-06-05 NOTE — Telephone Encounter (Signed)
I spoke with the patient. He would like for Dr. Graciela Husbands to change his simvastatin to atorvastatin since he has heard this is better at treating cholesterol. Per the patient, when he came to town, Dr. Lemmie Evens put him on simvastatin 80mg   1/2 tablet by mouth daily. He was then turned over to Dr. [place], but he stopped seeing him due to a lack of confidence in the care he was receiving. The patient does have a history of CAD w/ CABG. I explained to the patient that Dr. Graciela Husbands typically likes for patient's with CAD to have a general cardiologist as well. I explained that since he is due to see Dr. Graciela Husbands in March, I will ask Dr. Graciela Husbands to go ahead and address his cholesterol medication. He does not have a current lipid/liver panel in his chart. The patient will come in on 12/31 when he has his coumadin checked and he will also have a fasting lipid/liver/bmet (on lisinopril). I advised that Dr. Graciela Husbands can review this and put him on the correct dose of lipitor after that. We will see the patient for his regular office visit with Dr. Graciela Husbands on 07/16/11. This will be a little early for follow up, but the patient is pending knee surgery in March, and his clearance can go ahead and be addressed at this time. He will need lovenox bridging as he has a history of stroke. I will call him back after Dr. Graciela Husbands tells me what does of lipitor he recommends and I send this to his pharmacy for a #90 day supply. The patient is agreeable. He can address with Dr. Graciela Husbands at his visit in February, if he needs to establish with a general cardiologist.

## 2011-06-05 NOTE — Telephone Encounter (Signed)
New problem:  Would like to change Zocor 40 mg  to Lipitor. [facility] [phone].

## 2011-06-09 ENCOUNTER — Encounter: Payer: Medicare Other | Admitting: *Deleted

## 2011-06-09 ENCOUNTER — Other Ambulatory Visit (INDEPENDENT_AMBULATORY_CARE_PROVIDER_SITE_OTHER): Payer: Medicare Other | Admitting: *Deleted

## 2011-06-09 ENCOUNTER — Ambulatory Visit (INDEPENDENT_AMBULATORY_CARE_PROVIDER_SITE_OTHER): Payer: Medicare Other | Admitting: *Deleted

## 2011-06-09 DIAGNOSIS — E782 Mixed hyperlipidemia: Secondary | ICD-10-CM

## 2011-06-09 DIAGNOSIS — I6350 Cerebral infarction due to unspecified occlusion or stenosis of unspecified cerebral artery: Secondary | ICD-10-CM

## 2011-06-09 DIAGNOSIS — Z7901 Long term (current) use of anticoagulants: Secondary | ICD-10-CM

## 2011-06-09 DIAGNOSIS — I639 Cerebral infarction, unspecified: Secondary | ICD-10-CM

## 2011-06-09 DIAGNOSIS — I4892 Unspecified atrial flutter: Secondary | ICD-10-CM

## 2011-06-09 DIAGNOSIS — Z79899 Other long term (current) drug therapy: Secondary | ICD-10-CM

## 2011-06-09 LAB — HEPATIC FUNCTION PANEL
Albumin: 4.1 g/dL (ref 3.5–5.2)
Bilirubin, Direct: 0 mg/dL (ref 0.0–0.3)
Total Protein: 7 g/dL (ref 6.0–8.3)

## 2011-06-09 LAB — BASIC METABOLIC PANEL
CO2: 27 mEq/L (ref 19–32)
Calcium: 9.2 mg/dL (ref 8.4–10.5)
Creatinine, Ser: 0.8 mg/dL (ref 0.4–1.5)
Glucose, Bld: 87 mg/dL (ref 70–99)

## 2011-06-09 LAB — LIPID PANEL
HDL: 52.9 mg/dL (ref >39.00)
Triglycerides: 95 mg/dL (ref 0.0–149.0)

## 2011-06-09 LAB — POCT INR: INR: 3.6

## 2011-06-09 NOTE — Telephone Encounter (Signed)
fu call Pt had lab done this morning He had some questions about lipitor prescription please call him back

## 2011-06-09 NOTE — Telephone Encounter (Signed)
[name] wanted to remind Dr Graciela Husbands and his nurse that he had his blood drawn this am and would like an rx of Lipitor to be called to [facility] as soon as Dr Graciela Husbands is in the office next.  I told him that Dr Graciela Husbands would not be in until Friday.  He also wants to know if an appt with a cardiologist was made.  He is under the assumption that he needs a general cardiologist as well as Dr Graciela Husbands.

## 2011-06-17 ENCOUNTER — Ambulatory Visit (AMBULATORY_SURGERY_CENTER): Payer: Medicare Other | Admitting: *Deleted

## 2011-06-17 VITALS — Ht 67.0 in | Wt 199.0 lb

## 2011-06-17 DIAGNOSIS — Z1211 Encounter for screening for malignant neoplasm of colon: Secondary | ICD-10-CM

## 2011-06-17 DIAGNOSIS — Z85038 Personal history of other malignant neoplasm of large intestine: Secondary | ICD-10-CM

## 2011-06-17 DIAGNOSIS — Z86010 Personal history of colonic polyps: Secondary | ICD-10-CM

## 2011-06-17 MED ORDER — PEG-KCL-NACL-NASULF-NA ASC-C 100 G PO SOLR: ORAL | Status: DC

## 2011-06-17 MED ORDER — ATORVASTATIN CALCIUM 40 MG PO TABS: 40.0000 mg | ORAL_TABLET | Freq: Every day | ORAL | Status: DC

## 2011-06-17 NOTE — Telephone Encounter (Signed)
Per Dr. Graciela Husbands. Start lipitor 40 mg once daily. I have advised the patient of this. He has f/u with Dr. Graciela Husbands in early February. We will arrange a repeat lipid and liver panel with him at that time. He would like his medication sent to [facility] on [address] In [place] for # 90.

## 2011-06-17 NOTE — Telephone Encounter (Signed)
Fu call Pt wants to know if he can get rx for generic-lipitor. He hasnt heard anything about getting this rx please call

## 2011-06-20 ENCOUNTER — Other Ambulatory Visit (INDEPENDENT_AMBULATORY_CARE_PROVIDER_SITE_OTHER): Payer: Medicare Other

## 2011-06-20 DIAGNOSIS — C189 Malignant neoplasm of colon, unspecified: Secondary | ICD-10-CM

## 2011-06-20 DIAGNOSIS — D126 Benign neoplasm of colon, unspecified: Secondary | ICD-10-CM | POA: Diagnosis not present

## 2011-06-20 DIAGNOSIS — K635 Polyp of colon: Secondary | ICD-10-CM

## 2011-06-20 DIAGNOSIS — Z7901 Long term (current) use of anticoagulants: Secondary | ICD-10-CM

## 2011-06-20 DIAGNOSIS — D62 Acute posthemorrhagic anemia: Secondary | ICD-10-CM | POA: Diagnosis not present

## 2011-06-20 LAB — CBC WITH DIFFERENTIAL/PLATELET
Basophils Absolute: 0 10*3/uL (ref 0.0–0.1)
HCT: 38.8 % — ABNORMAL LOW (ref 39.0–52.0)
Lymphs Abs: 1.4 10*3/uL (ref 0.7–4.0)
MCV: 90.4 fl (ref 78.0–100.0)
Monocytes Absolute: 0.4 10*3/uL (ref 0.1–1.0)
Platelets: 158 10*3/uL (ref 150.0–400.0)
RDW: 13.9 % (ref 11.5–14.6)

## 2011-06-20 LAB — PROTIME-INR: Prothrombin Time: 32.6 s — ABNORMAL HIGH (ref 10.2–12.4)

## 2011-06-23 ENCOUNTER — Ambulatory Visit (HOSPITAL_COMMUNITY)
Admission: RE | Admit: 2011-06-23 | Discharge: 2011-06-23 | Disposition: A | Discharge status: Home / Self Care | Payer: Medicare Other | Source: Ambulatory Visit | Attending: Internal Medicine | Admitting: Internal Medicine

## 2011-06-23 ENCOUNTER — Encounter (HOSPITAL_COMMUNITY): Payer: Self-pay | Admitting: *Deleted

## 2011-06-23 ENCOUNTER — Encounter (HOSPITAL_COMMUNITY): Admission: RE | Disposition: A | Discharge status: Home / Self Care | Payer: Self-pay | Source: Ambulatory Visit

## 2011-06-23 DIAGNOSIS — I4891 Unspecified atrial fibrillation: Secondary | ICD-10-CM | POA: Diagnosis not present

## 2011-06-23 DIAGNOSIS — K649 Unspecified hemorrhoids: Secondary | ICD-10-CM | POA: Insufficient documentation

## 2011-06-23 DIAGNOSIS — Z8673 Personal history of transient ischemic attack (TIA), and cerebral infarction without residual deficits: Secondary | ICD-10-CM | POA: Diagnosis not present

## 2011-06-23 DIAGNOSIS — Z09 Encounter for follow-up examination after completed treatment for conditions other than malignant neoplasm: Secondary | ICD-10-CM | POA: Insufficient documentation

## 2011-06-23 DIAGNOSIS — Z5309 Procedure and treatment not carried out because of other contraindication: Secondary | ICD-10-CM | POA: Insufficient documentation

## 2011-06-23 DIAGNOSIS — Z860100 Personal history of colon polyps, unspecified: Secondary | ICD-10-CM | POA: Insufficient documentation

## 2011-06-23 DIAGNOSIS — Z7901 Long term (current) use of anticoagulants: Secondary | ICD-10-CM | POA: Diagnosis not present

## 2011-06-23 HISTORY — PX: COLONOSCOPY: SHX5424

## 2011-06-23 SURGERY — COLONOSCOPY
Anesthesia: Moderate Sedation

## 2011-06-23 MED ORDER — FENTANYL NICU IV SYRINGE 50 MCG/ML
INJECTION | INTRAMUSCULAR | Status: DC | PRN
  Administered 2011-06-23 (×5): 25 ug via INTRAVENOUS

## 2011-06-23 MED ORDER — FENTANYL CITRATE 0.05 MG/ML IJ SOLN
INTRAMUSCULAR | Status: AC
  Filled 2011-06-23: qty 2

## 2011-06-23 MED ORDER — SODIUM CHLORIDE 0.9 % IV SOLN
Freq: Once | INTRAVENOUS | Status: AC
  Administered 2011-06-23: 500 mL via INTRAVENOUS

## 2011-06-23 MED ORDER — MIDAZOLAM HCL 5 MG/5ML IJ SOLN
INTRAMUSCULAR | Status: DC | PRN
  Administered 2011-06-23 (×4): 2 mg via INTRAVENOUS

## 2011-06-23 MED ORDER — MIDAZOLAM HCL 10 MG/2ML IJ SOLN
INTRAMUSCULAR | Status: AC
  Filled 2011-06-23: qty 2

## 2011-06-23 NOTE — H&P (Addendum)
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 He is on warfarin due to atrial fibrillation and history of stroke. We have discussed this plan to look and biopsy on warfarin as well as reviewed with his cardiologist.  He has no active GI symptoms and says overall he is well.  Allergies  Allergen Reactions  . Niacin     contraindication   @ENCMEDSTART @ Past Medical History  Diagnosis Date  . Chickenpox   . Prostate cancer   . Blood transfusion abn reaction or complication, no procedure mishap   . History of colonic polyps     adenomatous  . Coronary artery disease   . Normal cardiac stress test   . Hyperlipidemia   . Hypertension   . Urinary incontinence   . Atrial flutter   . Onychomycosis   . Central retinal artery occlusion   . CVA (cerebral infarction)   . Atrial fibrillation   . History of blood clots   . GI bleed     after colonoscopy/polyp removal  . Anemia, posthemorrhagic, acute   . Stroke     blindness in left eye  . Blood transfusion    Past Surgical History  Procedure Date  . Coronary artery bypass graft 1989    4 vessels  . Coronary artery bypass graft 2007    and mitral valve replacement   . Mitral valve replacement 2007  . Radical prostatectomy with radiation 2003  . A flutter ablation 2010  . Bladder pump installed 2011    Dr. McDiarmid  . Colonoscopy w/ biopsies and polypectomy 12/17/2010, 12/24/2010    adenomatous polyps and malignant polyp, external hemorrhoids, repeat colonoscopy to control post-polypectomy bleed  . Inguinal hernia repair 1993    bilatera   History   Social History  . Marital Status: Married    Spouse Name: N/A    Number of Children: 3  . Years of Education: N/A   Occupational History  . retired Sport and exercise psychologist    Social History Main Topics  . Smoking status: Former Smoker    Quit date: 06/09/1977  .  Smokeless tobacco: Never Used  . Alcohol Use: 0.5 oz/week    1 drink(s) per week  . Drug Use: No  . Sexually Active: None   Other Topics Concern  . None   Social History Narrative  . None   Family History  Problem Relation Age of Onset  . Alcohol abuse Mother   . Hypertension Mother   . Stroke Mother   . Diabetes Mother   . Coronary artery disease Father   . Cancer Daughter     renal cell cancer  . 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 For close follow-up colonoscopy to ensure complete removal of the polyp.  Iva Boop, MD, [facility]

## 2011-06-23 NOTE — Op Note (Addendum)
[facility] [address] [place], [place]  [address]  COLONOSCOPY PROCEDURE REPORT  PATIENT:  [name], [name]  MR#:  [id number] BIRTHDATE:  [birth year], 77 yrs. old  GENDER:  male ENDOSCOPIST:  Iva Boop, MD, [facility]  PROCEDURE DATE:  06/23/2011 PROCEDURE:  Incomplete colonoscopy ASA CLASS:  Class III INDICATIONS:  follow-up of right colon polyp with ? of focal carcinoma - removed July 2012 - patient remains on warfarin due to atrial fibrillation and history of stroke MEDICATIONS:   Fentanyl 100 mcg IV, Versed 8 mg IV  DESCRIPTION OF PROCEDURE:   After the risks benefits and alternatives of the procedure were thoroughly explained, informed consent was obtained.  Digital rectal exam was performed and revealed no rectal masses and that the prostate was surgically absent.   The Pentax Colonoscope [id number] endoscope was introduced through the anus and advanced to the splenic flexure, limited by extreme patient discomfort, looping difficulties.    The quality of the prep was excellent, using MoviPrep.  The instrument was then slowly withdrawn as the colon was fully examined. <<PROCEDUREIMAGES>>  FINDINGS:  Abnormal appearing mucosa in the descending colon was seen - 3 areas of scope trauma with minimal disruption of the mucosa and slight bleeding. Unable to easily advance the scope beyond the splenic flexure due to looping, angulation and he was bearing down persistently. Increasing the sedation, position change to back and abdominal pressure did not alleviate this. Due to concern over possible increased trauma and bleeding, I elected to stop the procedure.  This was otherwise a normal examination of the left colon.   Retroflexed views in the rectum revealed external hemorrhoids.  The scope was then withdrawn in  minutes from the cecum and the procedure completed. COMPLICATIONS:  None ENDOSCOPIC IMPRESSION: 1) Abnormal mucosa in the descending colon - scope trauma  with minor contact bleeding 2) Otherwise normal examination - incomplete colonoscopy 3) External hemorrhoids RECOMMENDATIONS: Will need to discuss a different approach. The looping, his inability to relax abdominal muscles and scope trauma with bleeding created concern for causing significant complications. I think, based upon this it would be better for him to have the procedure with propofol sedation and off warfarin (? enoxaparen window). I will contact him about this. REPEAT EXAM:  In 6 month(s) for Colonoscopy.  Recall placed to help ensure follow-up - anticipate arranging something before then.Iva Boop, MD, [facility] Graham  CC:  The Patient [name], MD Gershon Crane, MD (corrected)  n. REVISED:  06/23/2011 09:49 AM eSIGNED:   Iva Boop at 06/23/2011 09:49 AM  [name], [name], [id number]

## 2011-06-24 ENCOUNTER — Encounter (HOSPITAL_COMMUNITY): Payer: Self-pay | Admitting: Internal Medicine

## 2011-06-24 ENCOUNTER — Telehealth: Payer: Self-pay | Admitting: Internal Medicine

## 2011-06-24 NOTE — Telephone Encounter (Signed)
Patient is scheduled for 07/01/11 10:00.  His wife will have him call back to confirm the appt.    Next propofol date is 1/24, 2/4, 3/14

## 2011-06-24 NOTE — Telephone Encounter (Signed)
Sounds like abdominal wall strain - he was bearing down a lot during colonoscopy attempt yesterday  If there is a slot next week set it up - I was going to try to call him but would be able to arrange propofol colonoscopy and hold warfarin, etc easier if he comes in  Let him know I have already discussed it (again) with Dr. Graciela Husbands   When is next propofol and one after that?

## 2011-06-24 NOTE — Telephone Encounter (Signed)
Patient had incomplete colon yesterday.  He c/o abdominal pain when "coughing" today.  He reports he is not having any other symptoms. He has not had a BM yet, but is passing gas.  He denies rectal bleeding, diarrhea, nausea or vomiting, or continuous pain.  I have discussed with Dr Leone Payor patient to be offered an appt with [name] RNP today, heating pad, avoid NDAIDS and exercise until symptoms are relieved.  Dr Leone Payor believes most likely abdominal wall strain from bearing down against colonoscope yesterday.    I have offered the patient an appt today with [name] RNP he declines.  I have relayed Dr Marvell Fuller recommendations.  The patient is asked to call back for any worsening symptoms or new symptoms.  The patient verbalized understanding.  He wants to come in and discuss with Dr Leone Payor the next step.  Would like to discuss next step prior to his upcoming knee surgery.  Dr Leone Payor please advise.

## 2011-06-24 NOTE — Telephone Encounter (Signed)
Patient advised.  He will see Dr Leone Payor 07/10/11 9:45

## 2011-07-01 ENCOUNTER — Ambulatory Visit: Payer: Medicare Other | Admitting: Internal Medicine

## 2011-07-01 ENCOUNTER — Telehealth: Payer: Self-pay | Admitting: Internal Medicine

## 2011-07-01 NOTE — Telephone Encounter (Signed)
Left message for patient to call back  

## 2011-07-01 NOTE — Telephone Encounter (Signed)
I know he is coming 1/31 but would like to have him do a colonoscopy with propofol 2/4  Call him and let him know that - would be better sedation and we would do off Coumadin x 3 days - I can discuss further on 1/31 appointment but would like to get him scheduled

## 2011-07-01 NOTE — Telephone Encounter (Signed)
Patient is scheduled for colon with propofol on 07/14/11 3:30.  He is advised that all instructions will be given to him at his office visit on 07/10/11.  See Dr Leone Payor notes on phone note from 07/01/11 for further documentation

## 2011-07-01 NOTE — Telephone Encounter (Signed)
See phone note from 07/01/11 for further documentation

## 2011-07-07 ENCOUNTER — Ambulatory Visit (INDEPENDENT_AMBULATORY_CARE_PROVIDER_SITE_OTHER): Payer: Medicare Other

## 2011-07-07 DIAGNOSIS — I4892 Unspecified atrial flutter: Secondary | ICD-10-CM | POA: Diagnosis not present

## 2011-07-07 DIAGNOSIS — I6350 Cerebral infarction due to unspecified occlusion or stenosis of unspecified cerebral artery: Secondary | ICD-10-CM | POA: Diagnosis not present

## 2011-07-07 DIAGNOSIS — Z7901 Long term (current) use of anticoagulants: Secondary | ICD-10-CM | POA: Diagnosis not present

## 2011-07-07 DIAGNOSIS — I639 Cerebral infarction, unspecified: Secondary | ICD-10-CM

## 2011-07-10 ENCOUNTER — Encounter: Payer: Self-pay | Admitting: Internal Medicine

## 2011-07-10 ENCOUNTER — Ambulatory Visit (INDEPENDENT_AMBULATORY_CARE_PROVIDER_SITE_OTHER): Payer: Medicare Other | Admitting: Internal Medicine

## 2011-07-10 VITALS — BP 120/60 | HR 62 | Ht 67.0 in | Wt 197.6 lb

## 2011-07-10 DIAGNOSIS — D126 Benign neoplasm of colon, unspecified: Secondary | ICD-10-CM | POA: Diagnosis not present

## 2011-07-10 DIAGNOSIS — Z7901 Long term (current) use of anticoagulants: Secondary | ICD-10-CM | POA: Diagnosis not present

## 2011-07-10 DIAGNOSIS — Z9283 Personal history of failed moderate sedation: Secondary | ICD-10-CM | POA: Diagnosis not present

## 2011-07-10 MED ORDER — PEG-KCL-NACL-NASULF-NA ASC-C 100 G PO SOLR: 1.0000 | Freq: Once | ORAL | Status: DC

## 2011-07-10 NOTE — Patient Instructions (Addendum)
Will see you at your procedure visit on 07/14/11 @ 2:30 pm. Have already talked with Dr. Clide Cliff about the stopping of your warfarin, stop the medication on 07/11/11 (tomorrow night). You have been scheduled for a Colonoscopy with separate instructions given. Your prep kit has been sent to your pharmacy for you to pick up.

## 2011-07-10 NOTE — Progress Notes (Signed)
Subjective:    Patient ID: [name], male    DOB: [birth year], 78 y.o.   MRN: [id number]  HPI This man returns to discuss followup of an advanced adenoma of the ascending colon.. He had a large descending colon polyp in July, there was a possible focus of carcinoma in situ. This caused considerable anxiety. He had a post polypectomy bleed as well in the setting of a Lovenox window after holding warfarin therapy. I recently attempted a repeat colonoscopy to assess for complete removal of this ascending colon polyp on warfarin, he had significant discomfort with the scope in the left colon and was bearing down and the moderate sedation did not provide adequate sedation. There were some areas of scope trauma noticed and the procedure was aborted. He had some abdominal wall pain after that that is resolved. Allergies  Allergen Reactions  . Niacin     contraindication   Outpatient Prescriptions Prior to Visit  Medication Sig Dispense Refill  . ALPRAZolam (XANAX) 1 MG tablet Take 1 tablet (1 mg total) by mouth at bedtime as needed for sleep.  30 tablet  5  . atorvastatin (LIPITOR) 40 MG tablet Take 1 tablet (40 mg total) by mouth daily.  90 tablet  3  . glucosamine-chondroitin 500-400 MG tablet Take 1 tablet by mouth daily.        Marland Kitchen lisinopril (PRINIVIL,ZESTRIL) 20 MG tablet Take 20 mg by mouth daily.       . metoprolol tartrate (LOPRESSOR) 25 MG tablet Take 0.5 tablets (12.5 mg total) by mouth 2 (two) times daily.  30 tablet  6  . Multiple Vitamin (MULTIVITAMIN) capsule Take 1 capsule by mouth daily.        . traMADol (ULTRAM) 50 MG tablet Take 50 mg by mouth as needed.       . warfarin (COUMADIN) 5 MG tablet Take as directed by coumadin clinic  40 tablet  3   Past Medical History  Diagnosis Date  . Chickenpox   . Prostate cancer   . Blood transfusion abn reaction or complication, no procedure mishap   . History of colonic polyps     adenomatous  . Coronary artery disease   . Normal  cardiac stress test   . Hyperlipidemia   . Hypertension   . Urinary incontinence   . Atrial flutter   . Onychomycosis   . Central retinal artery occlusion   . CVA (cerebral infarction)   . Atrial fibrillation   . History of blood clots   . GI bleed     after colonoscopy/polyp removal  . Anemia, posthemorrhagic, acute   . Stroke     blindness in left eye  . Blood transfusion    Past Surgical History  Procedure Date  . Coronary artery bypass graft 1989    4 vessels  . Coronary artery bypass graft 2007    and mitral valve replacement   . Mitral valve replacement 2007  . Radical prostatectomy with radiation 2003  . A flutter ablation 2010  . Bladder pump installed 2011    Dr. McDiarmid  . Colonoscopy w/ biopsies and polypectomy 12/17/2010, 12/24/2010    adenomatous polyps and one focal carcinoma in situ, external hemorrhoids, repeat colonoscopy to control post-polypectomy bleed  . Inguinal hernia repair 1993    bilatera  . Colonoscopy 06/23/2011    Procedure: COLONOSCOPY;  Surgeon: Iva Boop, MD;  Location: [facility];  Service: Endoscopy;  Laterality: N/A;   History   Social History  .  Marital Status: Married    Spouse Name: N/A    Number of Children: 3  . Years of Education: N/A   Occupational History  . retired Sport and exercise psychologist    Social History Main Topics  . Smoking status: Former Smoker    Quit date: 06/09/1977  . Smokeless tobacco: Never Used  . Alcohol Use: 0.5 oz/week    1 drink(s) per week  . Drug Use: No  .       Family History  Problem Relation Age of Onset  . Alcohol abuse Mother   . Hypertension Mother   . Stroke Mother   . Diabetes Mother   . Coronary artery disease Father   . Cancer Daughter     renal cell cancer  . Colon cancer Neg Hx          Review of Systems No chest pain or dyspnea.    Objective:   Physical Exam Well-developed well-nourished elderly white man in no acute distress Lungs are clear Heart S1-S2 no rubs  murmurs or gallops, sounds like he is in a regular rhythm at this time       Assessment & Plan:     Colonoscopy is arranged for Feb fourth with propofol. I think that the combination of deep sedation and using adjustable Olympus scopes vs. Pentax will allow completion of the procedure. I explained the risks benefits and indications of the procedure as well as the sedation and the rationale for doing at this way. I have also discussed holding his warfarin for 3 days with his cardiologist. Patient is inclined to do this, he does not want to use the Lovenox window and understands the small but real risk of having a stroke or other complication off the warfarin. given the scope trauma issues with bleeding last time have decided to avoid anti-coagulation.  He continues to have significant anxiety over possible cancer ( unlikely) and would like to know that the polyp was removed. He is responsible for the care of a granddaughter and wants to be available for her in the coming years.

## 2011-07-14 ENCOUNTER — Encounter: Payer: Self-pay | Admitting: Internal Medicine

## 2011-07-14 ENCOUNTER — Encounter: Payer: Medicare Other | Admitting: *Deleted

## 2011-07-14 ENCOUNTER — Ambulatory Visit (AMBULATORY_SURGERY_CENTER): Payer: Medicare Other | Admitting: Internal Medicine

## 2011-07-14 VITALS — BP 132/70 | HR 64 | Temp 96.0°F | Resp 18 | Ht 67.0 in | Wt 197.0 lb

## 2011-07-14 DIAGNOSIS — I10 Essential (primary) hypertension: Secondary | ICD-10-CM | POA: Diagnosis not present

## 2011-07-14 DIAGNOSIS — Z8673 Personal history of transient ischemic attack (TIA), and cerebral infarction without residual deficits: Secondary | ICD-10-CM | POA: Diagnosis not present

## 2011-07-14 DIAGNOSIS — I2510 Atherosclerotic heart disease of native coronary artery without angina pectoris: Secondary | ICD-10-CM | POA: Diagnosis not present

## 2011-07-14 DIAGNOSIS — Z86010 Personal history of colonic polyps: Secondary | ICD-10-CM | POA: Diagnosis not present

## 2011-07-14 DIAGNOSIS — D126 Benign neoplasm of colon, unspecified: Secondary | ICD-10-CM | POA: Diagnosis not present

## 2011-07-14 HISTORY — PX: COLONOSCOPY W/ BIOPSIES AND POLYPECTOMY: SHX1376

## 2011-07-14 MED ORDER — SODIUM CHLORIDE 0.9 % IV SOLN: 500.0000 mL | INTRAVENOUS | Status: DC

## 2011-07-14 NOTE — Patient Instructions (Addendum)
Please see the colonoscopy report also. I found 2 new polyps and removed them. One was tiny and the other medium and required snare removal. I placed a clip to reduce bleeding risk on the larger one. The large polypectomy site was seen and biopsies were taken. No obvious polyp appeared to be left. I recommend you restart warfarin tomorrow at your usual dose to reduce risk of stroke.  I will let you know (probably next week) about the pathology results. Iva Boop, MD, [facility]  Please follow discharge instructions given today. Please keep head dressing on until tomorrow. May remove dressing tomorrow and keep wound clean and dry. Follow up with Dr.Fry for wound care. Call us with any questions or concerns. Thank you!!

## 2011-07-14 NOTE — Progress Notes (Signed)
Patient did not experience any of the following events: a burn prior to discharge; a fall within the facility; wrong site/side/patient/procedure/implant event; or a hospital transfer or hospital admission upon discharge from the facility. (G8907) Patient did not have preoperative order for IV antibiotic SSI prophylaxis. (G8918)  

## 2011-07-14 NOTE — Op Note (Addendum)
[facility] [address] [place], [place]  [address]  COLONOSCOPY PROCEDURE REPORT  PATIENT:  Francesco, Provencal  MR#:  [id number] BIRTHDATE:  1933/12/03, 77 yrs. old  GENDER:  male ENDOSCOPIST:  Iva Boop, MD, [facility]  PROCEDURE DATE:  07/14/2011 PROCEDURE:  Colonoscopy with biopsy and snare polypectomy, Colonoscopy with submucosal injection ASA CLASS:  Class III INDICATIONS:  evaluate for residual colon polyp - large ascending polyp removed 12/2010. ? focus of carcinoma in polyp MEDICATIONS:   MAC sedation, administered by CRNA, propofol (Diprivan)  DESCRIPTION OF PROCEDURE:   After the risks benefits and alternatives of the procedure were thoroughly explained, informed consent was obtained.  Digital rectal exam was performed and revealed no rectal masses and that the prostate was surgically absent.   The LB 180AL [id number] endoscope was introduced through the anus and advanced to the cecum, which was identified by both the appendix and ileocecal valve, without limitations.  The quality of the prep was excellent, using MoviPrep.  The instrument was then slowly withdrawn as the colon was fully examined. <<PROCEDUREIMAGES>>  FINDINGS:  A sessile polyp was found in the ascending colon. It was 1 cm in size. Narrow, flat polyp between folds. Max 1 cm. Polyp was snared, then cauterized with monopolar cautery. Retrieval was successful. Endoscopic clip placement at site - prophylactic given need for warfarin.  A scar was found in the ascending colon. This was across from the polyp removed. Prior large polypectomy  site - best seen in retroflexion. A few nodular areas seen but no clear recurrent polyp. Multiple biopsies were obtained and sent to pathology. the site was marked proximal and distal with SPOT, submucosal injection.  A diminutive polyp was found in the distal transverse colon. The polyp was removed using cold biopsy forceps.  This was otherwise a normal examination  of the colon. The colon is redundant and his prior prostatectomy and radiation create an element of fixed left colon that make the insertion difficult. Some minor scope trauma seen upon exit, in sigmoid.   Retroflexed views in the rectum revealed no abnormalities.    The time to cecum = 8:04 minutes. The scope was then withdrawn in 22:49 minutes from the cecum and the procedure completed. COMPLICATIONS:  None ENDOSCOPIC IMPRESSION: 1) 1 cm sessile polyp in the ascending colon - new finding - removed and prophylactic clip placed 2) Scar in the ascending colon - prior polypectomy site - biopsied and site tattooed 3) Diminutive polyp in the distal transverse colon - new finding - removed 4) Otherwise normal examination RECOMMENDATIONS: 1) restart warfarin tonight - normal dose CORRECTION - Tomorrow AM (takes in AM) 2) Will call with pathology results and plans 3) MAC sedation and adjustable Olympus colonoscope for future exams if possible - this is a technically difficult colonoscopy REPEAT EXAM:  In for Colonoscopy, pending biopsy results.  Iva Boop, MD, [facility]  CC:  The Patient and Nelwyn Salisbury, MD  n. REVISED:  07/14/2011 05:11 PM eSIGNED:   Iva Boop at 07/14/2011 05:11 PM  Hernon, [name], [id number]

## 2011-07-14 NOTE — Progress Notes (Signed)
Per the pt he hit his right side of his scalp on the corner of the cabinet at his home before coming to see [place].  The pt had a bloody baneaid to his right scalp and he asked if I would look at the area.  I carefully removed the bandage.  There was a small area that was oozing bright red blood.  I covered the area with clean 4x4 and advised Dr. Leone Payor of the pt's situation.  Dr. Clydene Pugh into the admitting area and assess the pt.  Per Dr. Leone Payor clean the cut up and okay to apply triple antibiotic ointment and clean bandage to his right scalp.  Done so.  I placed the pt's finger directly over cut hat was oozing.  I marked the bandage with a small pea size circle and put the time and the pt continues to hold pressure. maw   Per the pt his last dose of warfarin was Friday 07/11/11.  maw

## 2011-07-15 ENCOUNTER — Encounter: Payer: Self-pay | Admitting: Family Medicine

## 2011-07-15 ENCOUNTER — Telehealth: Payer: Self-pay | Admitting: *Deleted

## 2011-07-15 ENCOUNTER — Ambulatory Visit (INDEPENDENT_AMBULATORY_CARE_PROVIDER_SITE_OTHER): Payer: Medicare Other | Admitting: Family Medicine

## 2011-07-15 VITALS — BP 116/60 | HR 75 | Temp 98.5°F | Wt 197.0 lb

## 2011-07-15 DIAGNOSIS — Z01818 Encounter for other preprocedural examination: Secondary | ICD-10-CM | POA: Diagnosis not present

## 2011-07-15 DIAGNOSIS — I4891 Unspecified atrial fibrillation: Secondary | ICD-10-CM | POA: Diagnosis not present

## 2011-07-15 DIAGNOSIS — M25569 Pain in unspecified knee: Secondary | ICD-10-CM | POA: Diagnosis not present

## 2011-07-15 DIAGNOSIS — I2510 Atherosclerotic heart disease of native coronary artery without angina pectoris: Secondary | ICD-10-CM

## 2011-07-15 NOTE — Progress Notes (Signed)
  Subjective:    Patient ID: [name], male    DOB: 07-15-1933, 78 y.o.   MRN: [id number]  HPI Here for preoperative evaluation since he is scheduled for a left knee total arthroplasty per Dr. Richardson Landry on 08-27-11. Other than pain in the knee he feels fine. No chest pains or SOB. He sees Dr. Graciela Husbands for his hx of atrial fibrillation/flutter and CAD, and in fact will see him tomorrow. He remains on Coumadin and has done well since the CVA he suffered last year. Other than residual vision loss in the left eye, he has had not other neurologic issues. He remains active and works out at a gym several days a week. He recently has undergone a series of colonoscopies per Dr. Leone Payor for removals of polyps. The pathology report is still pending from the last procedure.   Review of Systems  Constitutional: Negative.   HENT: Negative.   Eyes: Positive for visual disturbance. Negative for photophobia, pain, discharge, redness and itching.  Respiratory: Negative.   Cardiovascular: Negative.   Gastrointestinal: Negative.   Genitourinary: Negative.   Musculoskeletal: Negative.   Skin: Negative.   Neurological: Negative.   Hematological: Negative.   Psychiatric/Behavioral: Negative.        Objective:   Physical Exam  Constitutional: He is oriented to person, place, and time. He appears well-developed and well-nourished.       Walks with a cane   HENT:  Head: Normocephalic.  Right Ear: External ear normal.  Left Ear: External ear normal.  Nose: Nose normal.  Mouth/Throat: Oropharynx is clear and moist. No oropharyngeal exudate.       He has a small abrasion on the right temple from striking his head on a cabinet last week  Eyes:       Right eye is clear   Neck: Normal range of motion. Neck supple. No JVD present. No thyromegaly present.  Cardiovascular: Normal rate, regular rhythm, normal heart sounds and intact distal pulses.  Exam reveals no gallop and no friction rub.   No murmur  heard. Pulmonary/Chest: Effort normal and breath sounds normal. No respiratory distress. He has no wheezes. He has no rales. He exhibits no tenderness.  Abdominal: Bowel sounds are normal. He exhibits no distension and no mass. There is no tenderness. There is no rebound and no guarding.  Musculoskeletal:       Unremarkable except for the left knee  Lymphadenopathy:    He has no cervical adenopathy.  Neurological: He is alert and oriented to person, place, and time. He exhibits normal muscle tone. Coordination normal.       Cranial nerves intact except for the left eye  Skin: Skin is warm and dry.  Psychiatric: He has a normal mood and affect. His behavior is normal. Thought content normal.          Assessment & Plan:  Preoperative clearance exam prior to a knee replacement. He will see Dr. Graciela Husbands tomorrow for a formal cardiologic exam, but from my perspective he is cleared for this surgery. Of course he will need to go off Coumadin and go on Lovenox for a time. He had full labs done over the course of the last 6 weeks, and these looked fine.

## 2011-07-15 NOTE — Telephone Encounter (Signed)
  Follow up Call-  Call back number 07/14/2011 12/17/2010  Post procedure Call Back phone  # [phone] hm [phone]  Permission to leave phone message Yes -     Patient questions:  Do you have a fever, pain , or abdominal swelling? no Pain Score  0 *  Have you tolerated food without any problems? yes  Have you been able to return to your normal activities? yes  Do you have any questions about your discharge instructions: Diet   no Medications  no Follow up visit  no  Do you have questions or concerns about your Care? no  Actions: * If pain score is 4 or above: No action needed, pain <4.

## 2011-07-16 ENCOUNTER — Encounter: Payer: Self-pay | Admitting: Internal Medicine

## 2011-07-16 ENCOUNTER — Ambulatory Visit (INDEPENDENT_AMBULATORY_CARE_PROVIDER_SITE_OTHER): Payer: Medicare Other | Admitting: Internal Medicine

## 2011-07-16 DIAGNOSIS — I6350 Cerebral infarction due to unspecified occlusion or stenosis of unspecified cerebral artery: Secondary | ICD-10-CM

## 2011-07-16 DIAGNOSIS — Z01810 Encounter for preprocedural cardiovascular examination: Secondary | ICD-10-CM | POA: Diagnosis not present

## 2011-07-16 DIAGNOSIS — I639 Cerebral infarction, unspecified: Secondary | ICD-10-CM

## 2011-07-16 DIAGNOSIS — I2510 Atherosclerotic heart disease of native coronary artery without angina pectoris: Secondary | ICD-10-CM

## 2011-07-16 DIAGNOSIS — I4892 Unspecified atrial flutter: Secondary | ICD-10-CM

## 2011-07-16 NOTE — Patient Instructions (Signed)
Your physician has requested that you have a lexiscan myoview. For further information please visit [email or web address] Please follow instruction sheet, as given.  Your physician wants you to follow-up in: 6 months with Dr. Graciela Husbands. You will receive a reminder letter in the mail two months in advance. If you don't receive a letter, please call our office to schedule the follow-up appointment.

## 2011-07-16 NOTE — Assessment & Plan Note (Signed)
Holding sinus rhythm; continue warfarin

## 2011-07-16 NOTE — Progress Notes (Signed)
HPI  [name] is a 78 y.o. male seen in followup of atrial flutter and a recent stroke manifesting as central retinal artery occlusion.  It was decided after not to pursue catheter ablation as the symptoms were minimal and the patient would need long-term anticoagulation notwithstanding.He remains blind in one eye He has a history of bypass surgery and mitral valve repair in 2007. LVEF by echo 2010  The patient denies chest pain he is limited in his activity largely by a knee for which he is scheduled to undergo surgery in March.  He has undergone a series of 4 colonoscopies to try to identify the polyps and a biopsy is pending at this time Past Medical History  Diagnosis Date  . Chickenpox   . Blood transfusion abn reaction or complication, no procedure mishap   . History of colonic polyps     adenomatous  . Coronary artery disease     s/p CABG and Mitral valve repair  . Normal cardiac stress test   . Hyperlipidemia   . Hypertension   . Urinary incontinence   . Onychomycosis   . Central retinal artery occlusion   . CVA (cerebral infarction)   . Atrial fibrillation // Atrial flutter   . History of blood clots   . GI bleed     after colonoscopy/polyp removal  . Anemia, posthemorrhagic, acute   . Blood transfusion   . Prostate cancer     Past Surgical History  Procedure Date  . Coronary artery bypass graft 1989    4 vessels  . Coronary artery bypass graft 2007    and mitral valve replacement   . Mitral valve replacement 2007  . Radical prostatectomy with radiation 2003  . A flutter ablation 2010  . Bladder pump installed 2011    Dr. McDiarmid  . Colonoscopy w/ biopsies and polypectomy 12/17/2010, 12/24/2010    adenomatous polyps and one focal carcinoma in situ, external hemorrhoids, repeat colonoscopy to control post-polypectomy bleed  . Inguinal hernia repair 1993    bilatera  . Colonoscopy 06/23/2011    Procedure: COLONOSCOPY;  Surgeon: Iva Boop, MD;   Location: [facility];  Service: Endoscopy;  Laterality: N/A;  . Mitral valve ring placement     Per pt he did not have a mitral valve replacement    Current Outpatient Prescriptions  Medication Sig Dispense Refill  . ALPRAZolam (XANAX) 1 MG tablet Take 1 tablet (1 mg total) by mouth at bedtime as needed for sleep.  30 tablet  5  . atorvastatin (LIPITOR) 40 MG tablet Take 1 tablet (40 mg total) by mouth daily.  90 tablet  3  . glucosamine-chondroitin 500-400 MG tablet Take 1 tablet by mouth daily.        Marland Kitchen lisinopril (PRINIVIL,ZESTRIL) 20 MG tablet Take 20 mg by mouth daily.       . metoprolol tartrate (LOPRESSOR) 25 MG tablet Take 12.5 mg by mouth daily.      . Multiple Vitamin (MULTIVITAMIN) capsule Take 1 capsule by mouth daily.        . traMADol (ULTRAM) 50 MG tablet Take 50 mg by mouth as needed.       . warfarin (COUMADIN) 5 MG tablet 5 mg. Take 1 1/2 tablets six days of the week & 1 tablet one day a week.        Allergies  Allergen Reactions  . Niacin     contraindication    Review of Systems negative except from HPI  and PMH  Physical Exam There were no vitals taken for this visit. Well developed and well nourished in no acute distress HENT normal E scleral and icterus clear Neck Supple JVP flat; carotids brisk and full Clear to ausculation Regular rate and rhythm, no murmurs gallops or rub Soft with active bowel sounds No clubbing cyanosis none Edema Alert and oriented, grossly normal motor and sensory function Skin Warm and Dry  We'll echocardiogram today demonstrates sinus rhythm at 61 intervals 0.19/0.10/0.38 Axis is 56 Nonspecific ST-T changes inferolaterally  Assessment and  Plan

## 2011-07-16 NOTE — Assessment & Plan Note (Signed)
This occurred in the setting of adequate anticoagulation; I would presume with low molecular weight heparins in anticipation of his knee surgery

## 2011-07-16 NOTE — Assessment & Plan Note (Signed)
Status post CABG. Functionally quite limited at this point. Because we can't get a sense of his functional status, we will undertake a lexiscan Myoview testing

## 2011-07-21 ENCOUNTER — Encounter: Payer: Medicare Other | Admitting: *Deleted

## 2011-07-21 ENCOUNTER — Encounter: Payer: Self-pay | Admitting: Internal Medicine

## 2011-07-21 NOTE — Progress Notes (Signed)
Quick Note:  Office - let him know the prior polyp is gone - no polyp or cancer in biopsies The two new polyps benign  Repeat routine colonoscopy 3 years  Will send a letter and recall via [facility] also ______

## 2011-07-22 ENCOUNTER — Other Ambulatory Visit: Payer: Self-pay | Admitting: *Deleted

## 2011-07-22 DIAGNOSIS — I6529 Occlusion and stenosis of unspecified carotid artery: Secondary | ICD-10-CM

## 2011-07-25 ENCOUNTER — Encounter (INDEPENDENT_AMBULATORY_CARE_PROVIDER_SITE_OTHER): Payer: Medicare Other | Admitting: *Deleted

## 2011-07-25 DIAGNOSIS — I6529 Occlusion and stenosis of unspecified carotid artery: Secondary | ICD-10-CM

## 2011-07-29 ENCOUNTER — Ambulatory Visit (HOSPITAL_COMMUNITY): Payer: Medicare Other | Attending: Cardiology | Admitting: Radiology

## 2011-07-29 DIAGNOSIS — R0602 Shortness of breath: Secondary | ICD-10-CM | POA: Diagnosis not present

## 2011-07-29 DIAGNOSIS — R9431 Abnormal electrocardiogram [ECG] [EKG]: Secondary | ICD-10-CM | POA: Diagnosis not present

## 2011-07-29 DIAGNOSIS — R0609 Other forms of dyspnea: Secondary | ICD-10-CM | POA: Insufficient documentation

## 2011-07-29 DIAGNOSIS — Z01810 Encounter for preprocedural cardiovascular examination: Secondary | ICD-10-CM | POA: Insufficient documentation

## 2011-07-29 DIAGNOSIS — Z8249 Family history of ischemic heart disease and other diseases of the circulatory system: Secondary | ICD-10-CM | POA: Insufficient documentation

## 2011-07-29 DIAGNOSIS — I4892 Unspecified atrial flutter: Secondary | ICD-10-CM | POA: Diagnosis not present

## 2011-07-29 DIAGNOSIS — I10 Essential (primary) hypertension: Secondary | ICD-10-CM

## 2011-07-29 DIAGNOSIS — E785 Hyperlipidemia, unspecified: Secondary | ICD-10-CM | POA: Insufficient documentation

## 2011-07-29 DIAGNOSIS — R0989 Other specified symptoms and signs involving the circulatory and respiratory systems: Secondary | ICD-10-CM | POA: Insufficient documentation

## 2011-07-29 DIAGNOSIS — Z951 Presence of aortocoronary bypass graft: Secondary | ICD-10-CM | POA: Diagnosis not present

## 2011-07-29 DIAGNOSIS — Z87891 Personal history of nicotine dependence: Secondary | ICD-10-CM | POA: Insufficient documentation

## 2011-07-29 DIAGNOSIS — E663 Overweight: Secondary | ICD-10-CM | POA: Insufficient documentation

## 2011-07-29 DIAGNOSIS — I2510 Atherosclerotic heart disease of native coronary artery without angina pectoris: Secondary | ICD-10-CM

## 2011-07-29 MED ORDER — TECHNETIUM TC 99M TETROFOSMIN IV KIT
10.0000 | PACK | Freq: Once | INTRAVENOUS | Status: AC | PRN
  Administered 2011-07-29: 10 via INTRAVENOUS

## 2011-07-29 MED ORDER — REGADENOSON 0.4 MG/5ML IV SOLN
0.4000 mg | Freq: Once | INTRAVENOUS | Status: AC
  Administered 2011-07-29: 0.4 mg via INTRAVENOUS

## 2011-07-29 MED ORDER — TECHNETIUM TC 99M TETROFOSMIN IV KIT
30.0000 | PACK | Freq: Once | INTRAVENOUS | Status: AC | PRN
  Administered 2011-07-29: 30 via INTRAVENOUS

## 2011-07-29 NOTE — Progress Notes (Signed)
[facility] SITE 3 NUCLEAR MED [address] [place] [address] [phone]  Cardiology Nuclear Med Study  [name] is a 78 y.o. male [id number] 03-29-1934   Nuclear Med Background Indication for Stress Test:  Evaluation for Ischemia, Abnormal EKG and Clearance for Pending (L) TKR on  08/27/11 by  Dr. Eulah Pont History:  Atrial Flutter; Ablation; '89 CABG; '07 Re-Do CABG w/MVR; '10 Echo:EF=55-60%, mild MR; '11 NGE:XBMWUX, EF=69% Cardiac Risk Factors: Carotid Disease, CVA, Family History - CAD, History of Smoking, Hypertension, Lipids and Overweight  Symptoms:  DOE   Nuclear Pre-Procedure Caffeine/Decaff Intake:  None NPO After: 10:00pm   Lungs:  Clear.  O2 SAT 98% on RA. IV 0.9% NS with Angio Cath:  20g  IV Site: R Antecubital  IV Started by:  Stanton Kidney, EMT-P  Chest Size (in):  42 Cup Size: n/a  Height: 5\' 7"  (1.702 m)  Weight:  194 lb (87.998 kg)  BMI:  Body mass index is 30.38 kg/(m^2). Tech Comments:  Lopressor held > 24 hours, per patient.    Nuclear Med Study 1 or 2 day study: 1 day  Stress Test Type:  Lexiscan  Reading MD: Olga Millers, MD  Order Authorizing Provider:  Sherryl Manges, MD  Resting Radionuclide: Technetium 67m Tetrofosmin  Resting Radionuclide Dose: 11.0 mCi   Stress Radionuclide:  Technetium 46m Tetrofosmin  Stress Radionuclide Dose: 33.0 mCi           Stress Protocol Rest HR: 70 Stress HR: 115  Rest BP: 110/63 Stress BP: 104/54  Exercise Time (min): n/a METS: n/a   Predicted Max HR: 145 bpm % Max HR: 79.31 bpm Rate Pressure Product: 32440   Dose of Adenosine (mg):  n/a Dose of Lexiscan: 0.4 mg  Dose of Atropine (mg): n/a Dose of Dobutamine: n/a mcg/kg/min (at max HR)  Stress Test Technologist: Smiley Houseman, CMA-N  Nuclear Technologist:  Doyne Keel, CNMT     Rest Procedure:  Myocardial perfusion imaging was performed at rest 45 minutes following the intravenous administration of Technetium 28m  Tetrofosmin.  Rest ECG: Atrial flutter.  Stress Procedure:  The patient received IV Lexiscan 0.4 mg over 15-seconds.  Technetium 37m Tetrofosmin injected at 30-seconds.  There were no significant changes with Lexiscan.  Quantitative spect images were obtained after a 45 minute delay.  Stress ECG: No significant ST segment change suggestive of ischemia.  QPS Raw Data Images:  Acquisition technically good; normal left ventricular size. Stress Images:  There is decreased uptake in the inferior wall. Rest Images:  There is decreased uptake in the inferior wall, less prominent compared to the stress images. Subtraction (SDS):  These findings are consistent with prior inferior infarct and mild peri-infarct ischemia. Transient Ischemic Dilatation (Normal <1.22):  1.11 Lung/Heart Ratio (Normal <0.45):  0.28  Quantitative Gated Spect Images QGS EDV:  82 ml QGS ESV:  33 ml QGS cine images:  Inferior akinesis. QGS EF: 60%  Impression Exercise Capacity:  Lexiscan with no exercise. BP Response:  Normal blood pressure response. Clinical Symptoms:  No chest pain. ECG Impression:  No significant ST segment change suggestive of ischemia; patient in atrial flutter during the study. Comparison with Prior Nuclear Study: No images to compare  Overall Impression:  Abnormal stress nuclear study with a large, partially reversible defect in the inferior wall suggestive of prior inferior infarct and mild peri-infarct ischemia.  Olga Millers

## 2011-08-07 ENCOUNTER — Telehealth: Payer: Self-pay | Admitting: Internal Medicine

## 2011-08-07 NOTE — Progress Notes (Addendum)
Spoke with patient about nonischemic scan with prior MI   He recounted to me that he had been told before about MI  No ischemia  Cleared for surgery [name] PUtt input for antiocoagulation  Will need bridginhg with history of prior CVA on anticoagulation

## 2011-08-07 NOTE — Telephone Encounter (Signed)
New Msg: [facility] calling wanting to see if pt can get surgicalclearance for total knee replacement on March 20th. Please return call to discuss further.    FAX # [phone]

## 2011-08-08 ENCOUNTER — Encounter: Payer: Self-pay | Admitting: *Deleted

## 2011-08-08 NOTE — Telephone Encounter (Signed)
Note of clearance faxed to Dr. Greig Right office. I will send a staff message to [name] and ask her to help co-ordinate the patient's lovenox. The patient's surgery is scheduled for 3/20.

## 2011-08-11 ENCOUNTER — Ambulatory Visit (INDEPENDENT_AMBULATORY_CARE_PROVIDER_SITE_OTHER): Payer: Medicare Other | Admitting: Pharmacist

## 2011-08-11 DIAGNOSIS — I6350 Cerebral infarction due to unspecified occlusion or stenosis of unspecified cerebral artery: Secondary | ICD-10-CM

## 2011-08-11 DIAGNOSIS — I4892 Unspecified atrial flutter: Secondary | ICD-10-CM | POA: Diagnosis not present

## 2011-08-11 DIAGNOSIS — I639 Cerebral infarction, unspecified: Secondary | ICD-10-CM

## 2011-08-11 MED ORDER — ENOXAPARIN SODIUM 80 MG/0.8ML ~~LOC~~ SOLN: 80.0000 mg | Freq: Two times a day (BID) | SUBCUTANEOUS | Status: DC

## 2011-08-11 NOTE — Patient Instructions (Signed)
3/15-Last dose of Coumadin 3/16- No Coumadin or Lovenox 3/17- Lovenox 80mg  in AM and PM 3/18- Lovenox 80mg  in AM and PM 3/19- Lovenox 80mg  in AM and PM 3/20- Day of Surgery.

## 2011-08-19 DIAGNOSIS — M1710 Unilateral primary osteoarthritis, unspecified knee: Secondary | ICD-10-CM | POA: Diagnosis not present

## 2011-08-21 ENCOUNTER — Other Ambulatory Visit (HOSPITAL_COMMUNITY): Payer: Self-pay | Admitting: *Deleted

## 2011-08-21 NOTE — H&P (Signed)
  [facility] [address]   [address] [place], [place] [address] [phone] A Division of [facility]  [name], M.D.     [name], M.D.     [name], M.D. [name], M.D.    [name], M.D. [name], M.D. [name], D.O.          [name]            Evan A. Shepperson, [name] [place], OPA-C   RE: [name], [name]                                [id number]      DOB: 1933-11-17 PROGRESS NOTE: 08-19-11 Chief complaint: Left knee pain.  History of present illness: 51 five year-old white male with a history of end stage DJD, left knee, and chronic pain.  Returns.  States that symptoms are unchanged from previous visit.  He is wanting to proceed with left total knee replacement as scheduled.  We have received pre-op medical and cardiac clearance.  Patient stated today that he had some concern about the Foley catheter that will be used pre-op.  States that he has a urinary control system that was implanted initially in 2006 and revised in 2009 due to problems with his sphincter after prostate surgery.  His urologist is Dr. Isabel [name] and Dr. Eulah [name] did speak with him today in regards to [name]'s concerns.   Current medications: Ferrous Sulfate, Glucosamine Chondroitin, Avapro, Lisinopril, Simvastatin, Tramadol and Coumadin. Allergies: Niacin. Past medical/surgical history: Atrial fibrillation, hernia repair, prostatectomy, urinary control system implanted, CABG, coronary artery disease, mitral valve repair, hypertension and hyperlipidemia. Review of systems: Patient denies fevers, chills, current cardiac, pulmonary, GI or GU issues.    Family history: Positive for heart disease and arthritis. Social history: Does not smoke, admits occasional alcohol use.  Patient is self-employed as an Art gallery manager.       EXAMINATION: Height: 5?7.  Weight: 191 pounds.  Temperature:  97.8.  Respirations: 24.  Blood pressure: 121/74.  Pulse: 84.  Pleasant, elderly, white male, alert and oriented x 3 and in no acute distress.  Head is normocephalic, a traumatic.  PERRLA, EOMI.  Cervical spine unremarkable.  Lungs: CTA bilaterally.  No wheezes noted.  Heart: RRR.  No murmurs noted.  Abdomen: Round and non-distended.  NBS x 4.  Soft and non-tender.  Left knee: Decreased range of motion.  Positive crepitus.  Joint line tender.  Ligaments stable.  Decreased motion.  1-2+ effusion.  Calf non-tender.  Neurovascularly intact.  Skin warm and dry.   IMPRESSION: End stage DJD, left knee, and chronic pain.  Failed conservative treatment.   PLAN:  We will proceed with left total knee replacement as scheduled.  Surgical procedure, along with potential rehab/recovery time discussed.  All questions answered.  Per Dr. Isabel [name], we will not use a Foley catheter and we will need patient to void in pre-op holding and also immediately after surgery in PACU.  Dr. Isabel [name] will be available if needed.    [name], M.D.   Electronically verified by [name], M.D. DFM(JMO):jjh D 08-20-11 T 08-21-11

## 2011-08-22 ENCOUNTER — Encounter (HOSPITAL_COMMUNITY): Payer: Self-pay

## 2011-08-22 ENCOUNTER — Encounter (HOSPITAL_COMMUNITY)
Admission: RE | Admit: 2011-08-22 | Discharge: 2011-08-22 | Disposition: A | Discharge status: Home / Self Care | Payer: Medicare Other | Source: Ambulatory Visit | Attending: Orthopedic Surgery | Admitting: Orthopedic Surgery

## 2011-08-22 VITALS — BP 141/80 | HR 69 | Temp 97.0°F | Resp 20 | Ht 67.0 in | Wt 196.6 lb

## 2011-08-22 DIAGNOSIS — C189 Malignant neoplasm of colon, unspecified: Secondary | ICD-10-CM

## 2011-08-22 DIAGNOSIS — K635 Polyp of colon: Secondary | ICD-10-CM

## 2011-08-22 HISTORY — DX: Insomnia, unspecified: G47.00

## 2011-08-22 HISTORY — DX: Unspecified osteoarthritis, unspecified site: M19.90

## 2011-08-22 HISTORY — DX: Other specified spirochetal infections: A69.8

## 2011-08-22 HISTORY — DX: Non-pressure chronic ulcer of skin of other sites with unspecified severity: L98.499

## 2011-08-22 LAB — PROTIME-INR: Prothrombin Time: 26.7 seconds — ABNORMAL HIGH (ref 11.6–15.2)

## 2011-08-22 LAB — COMPREHENSIVE METABOLIC PANEL
ALT: 18 U/L (ref 0–53)
BUN: 15 mg/dL (ref 6–23)
Calcium: 9.2 mg/dL (ref 8.4–10.5)
GFR calc Af Amer: 76 mL/min — ABNORMAL LOW (ref >90)
Glucose, Bld: 100 mg/dL — ABNORMAL HIGH (ref 70–99)
Sodium: 140 mEq/L (ref 135–145)
Total Protein: 6.8 g/dL (ref 6.0–8.3)

## 2011-08-22 LAB — CBC
Hemoglobin: 14.7 g/dL (ref 13.0–17.0)
MCH: 30.6 pg (ref 26.0–34.0)
MCHC: 34.3 g/dL (ref 30.0–36.0)
RDW: 13.6 % (ref 11.5–15.5)

## 2011-08-22 LAB — URINALYSIS, ROUTINE W REFLEX MICROSCOPIC
Bilirubin Urine: NEGATIVE
Hgb urine dipstick: NEGATIVE
Protein, ur: NEGATIVE mg/dL
Urobilinogen, UA: 0.2 mg/dL (ref 0.0–1.0)

## 2011-08-22 LAB — TYPE AND SCREEN
ABO/RH(D): O POS
Antibody Screen: NEGATIVE

## 2011-08-22 LAB — SURGICAL PCR SCREEN: Staphylococcus aureus: POSITIVE — AB

## 2011-08-22 NOTE — Pre-Procedure Instructions (Addendum)
20 [name]  08/22/2011   Your procedure is scheduled on: Wednesday, March 20th.  Report to [facility] at :7:30 AM.  Call this number if you have problems the morning of surgery: [phone]   Remember:   Do not eat food:After Midnight.  May have clear liquids: up to 4 Hours before arrival.  Clear liquids include soda, tea, black coffee, apple or grape juice, broth.  Take these medicines the morning of surgery with A SIP OF WATER: Metoprolol. May take Ultram if needed.  Do not wear jewelry, make-up or nail polish.  Do not wear lotions, powders, or perfumes. You may wear deodorant.  Do not shave 48 hours prior to surgery.  Do not bring valuables to the hospital.  Contacts, dentures or bridgework may not be worn into surgery.  Leave suitcase in the car. After surgery it may be brought to your room.  For patients admitted to the hospital, checkout time is 11:00 AM the day of discharge.   Patients discharged the day of surgery will not be allowed to drive home.  Name and phone number of your driver: --  Special Instructions: CHG Shower Use Special Wash: 1/2 bottle night before surgery and 1/2 bottle morning of surgery.   Please read over the following fact sheets that you were given: Pain Booklet, Coughing and Deep Breathing, Blood Transfusion Information, MRSA Information and Surgical Site Infection Prevention

## 2011-08-22 NOTE — Progress Notes (Signed)
Ptt 40, PT 26.7, INR 2.42.  Pt took last dose of Coumadin on Thursday and will begin Lovenox on Sunday.  Chart left for Revonda Standard to review.

## 2011-08-26 MED ORDER — CEFAZOLIN SODIUM-DEXTROSE 2-3 GM-% IV SOLR
2.0000 g | INTRAVENOUS | Status: AC
  Administered 2011-08-27: 2 g via INTRAVENOUS
  Filled 2011-08-26: qty 50

## 2011-08-27 ENCOUNTER — Inpatient Hospital Stay (HOSPITAL_COMMUNITY)
Admission: RE | Admit: 2011-08-27 | Discharge: 2011-08-29 | DRG: 470 | Disposition: A | Discharge status: Home Health | Payer: Medicare Other | Source: Ambulatory Visit | Attending: Orthopedic Surgery | Admitting: Orthopedic Surgery

## 2011-08-27 ENCOUNTER — Encounter (HOSPITAL_COMMUNITY): Payer: Self-pay | Admitting: Certified Registered"

## 2011-08-27 ENCOUNTER — Encounter (HOSPITAL_COMMUNITY)
Admission: RE | Disposition: A | Discharge status: Home Health | Payer: Self-pay | Source: Ambulatory Visit | Attending: Orthopedic Surgery

## 2011-08-27 ENCOUNTER — Ambulatory Visit (HOSPITAL_COMMUNITY): Payer: Medicare Other | Admitting: Certified Registered"

## 2011-08-27 DIAGNOSIS — M1710 Unilateral primary osteoarthritis, unspecified knee: Secondary | ICD-10-CM | POA: Diagnosis not present

## 2011-08-27 DIAGNOSIS — M25569 Pain in unspecified knee: Secondary | ICD-10-CM | POA: Diagnosis not present

## 2011-08-27 DIAGNOSIS — Z471 Aftercare following joint replacement surgery: Secondary | ICD-10-CM | POA: Diagnosis not present

## 2011-08-27 DIAGNOSIS — I10 Essential (primary) hypertension: Secondary | ICD-10-CM | POA: Diagnosis not present

## 2011-08-27 DIAGNOSIS — IMO0002 Reserved for concepts with insufficient information to code with codable children: Secondary | ICD-10-CM | POA: Diagnosis not present

## 2011-08-27 DIAGNOSIS — Z01811 Encounter for preprocedural respiratory examination: Secondary | ICD-10-CM | POA: Diagnosis not present

## 2011-08-27 DIAGNOSIS — E782 Mixed hyperlipidemia: Secondary | ICD-10-CM

## 2011-08-27 DIAGNOSIS — I69998 Other sequelae following unspecified cerebrovascular disease: Secondary | ICD-10-CM | POA: Diagnosis not present

## 2011-08-27 DIAGNOSIS — Z01812 Encounter for preprocedural laboratory examination: Secondary | ICD-10-CM

## 2011-08-27 DIAGNOSIS — I2510 Atherosclerotic heart disease of native coronary artery without angina pectoris: Secondary | ICD-10-CM | POA: Diagnosis present

## 2011-08-27 DIAGNOSIS — G8918 Other acute postprocedural pain: Secondary | ICD-10-CM | POA: Diagnosis not present

## 2011-08-27 DIAGNOSIS — Z96659 Presence of unspecified artificial knee joint: Secondary | ICD-10-CM | POA: Diagnosis not present

## 2011-08-27 HISTORY — PX: TOTAL KNEE ARTHROPLASTY: SHX125

## 2011-08-27 LAB — PROTIME-INR: INR: 1.14 (ref 0.00–1.49)

## 2011-08-27 SURGERY — ARTHROPLASTY, KNEE, TOTAL
Anesthesia: General | Site: Knee | Laterality: Left | Wound class: Clean

## 2011-08-27 MED ORDER — NEOSTIGMINE METHYLSULFATE 1 MG/ML IJ SOLN
INTRAMUSCULAR | Status: DC | PRN
  Administered 2011-08-27: 3 mg via INTRAVENOUS

## 2011-08-27 MED ORDER — PHENOL 1.4 % MT LIQD: 1.0000 | OROMUCOSAL | Status: DC | PRN

## 2011-08-27 MED ORDER — CHLORHEXIDINE GLUCONATE CLOTH 2 % EX PADS: 6.0000 | MEDICATED_PAD | Freq: Every day | CUTANEOUS | Status: DC

## 2011-08-27 MED ORDER — ACETAMINOPHEN 325 MG PO TABS: 650.0000 mg | ORAL_TABLET | Freq: Four times a day (QID) | ORAL | Status: DC | PRN

## 2011-08-27 MED ORDER — DIPHENHYDRAMINE HCL 50 MG/ML IJ SOLN: 12.5000 mg | Freq: Four times a day (QID) | INTRAMUSCULAR | Status: DC | PRN

## 2011-08-27 MED ORDER — GLYCOPYRROLATE 0.2 MG/ML IJ SOLN
INTRAMUSCULAR | Status: DC | PRN
  Administered 2011-08-27: 0.4 mg via INTRAVENOUS

## 2011-08-27 MED ORDER — DROPERIDOL 2.5 MG/ML IJ SOLN
INTRAMUSCULAR | Status: DC | PRN
  Administered 2011-08-27: 0.625 mg via INTRAVENOUS

## 2011-08-27 MED ORDER — NALOXONE HCL 0.4 MG/ML IJ SOLN: 0.4000 mg | INTRAMUSCULAR | Status: DC | PRN

## 2011-08-27 MED ORDER — METHOCARBAMOL 500 MG PO TABS
500.0000 mg | ORAL_TABLET | Freq: Four times a day (QID) | ORAL | Status: DC | PRN
  Administered 2011-08-28 – 2011-08-29 (×2): 500 mg via ORAL
  Filled 2011-08-27 (×2): qty 1

## 2011-08-27 MED ORDER — ACETAMINOPHEN 650 MG RE SUPP: 650.0000 mg | Freq: Four times a day (QID) | RECTAL | Status: DC | PRN

## 2011-08-27 MED ORDER — SODIUM CHLORIDE 0.9 % IJ SOLN: 9.0000 mL | INTRAMUSCULAR | Status: DC | PRN

## 2011-08-27 MED ORDER — METHOCARBAMOL 100 MG/ML IJ SOLN
500.0000 mg | INTRAVENOUS | Status: AC
  Administered 2011-08-27: 500 mg via INTRAVENOUS
  Filled 2011-08-27: qty 5

## 2011-08-27 MED ORDER — DIPHENHYDRAMINE HCL 12.5 MG/5ML PO ELIX: 12.5000 mg | ORAL_SOLUTION | Freq: Four times a day (QID) | ORAL | Status: DC | PRN

## 2011-08-27 MED ORDER — ACETAMINOPHEN 10 MG/ML IV SOLN
INTRAVENOUS | Status: DC | PRN
  Administered 2011-08-27: 1000 mg via INTRAVENOUS

## 2011-08-27 MED ORDER — SUFENTANIL CITRATE 50 MCG/ML IV SOLN
INTRAVENOUS | Status: DC | PRN
  Administered 2011-08-27: 20 ug via INTRAVENOUS
  Administered 2011-08-27: 10 ug via INTRAVENOUS

## 2011-08-27 MED ORDER — ACETAMINOPHEN 325 MG PO TABS: 325.0000 mg | ORAL_TABLET | ORAL | Status: DC | PRN

## 2011-08-27 MED ORDER — MIDAZOLAM HCL 5 MG/5ML IJ SOLN
INTRAMUSCULAR | Status: DC | PRN
  Administered 2011-08-27: 2 mg via INTRAVENOUS

## 2011-08-27 MED ORDER — LACTATED RINGERS IV SOLN
INTRAVENOUS | Status: DC
  Administered 2011-08-27: 10:00:00 via INTRAVENOUS

## 2011-08-27 MED ORDER — METHOCARBAMOL 100 MG/ML IJ SOLN
500.0000 mg | Freq: Four times a day (QID) | INTRAVENOUS | Status: DC | PRN
  Filled 2011-08-27: qty 5

## 2011-08-27 MED ORDER — METOPROLOL TARTRATE 12.5 MG HALF TABLET
12.5000 mg | ORAL_TABLET | Freq: Every day | ORAL | Status: DC
  Administered 2011-08-29: 12.5 mg via ORAL
  Filled 2011-08-27 (×3): qty 1

## 2011-08-27 MED ORDER — ENOXAPARIN SODIUM 30 MG/0.3ML ~~LOC~~ SOLN
30.0000 mg | Freq: Two times a day (BID) | SUBCUTANEOUS | Status: DC
  Administered 2011-08-27 – 2011-08-29 (×4): 30 mg via SUBCUTANEOUS
  Filled 2011-08-27 (×5): qty 0.3

## 2011-08-27 MED ORDER — ROPIVACAINE HCL 5 MG/ML IJ SOLN
INTRAMUSCULAR | Status: DC | PRN
  Administered 2011-08-27 (×2): 15 mL

## 2011-08-27 MED ORDER — DEXAMETHASONE SODIUM PHOSPHATE 4 MG/ML IJ SOLN
INTRAMUSCULAR | Status: DC | PRN
  Administered 2011-08-27: 4 mg via INTRAVENOUS

## 2011-08-27 MED ORDER — LACTATED RINGERS IV SOLN
INTRAVENOUS | Status: DC | PRN
  Administered 2011-08-27: 10:00:00 via INTRAVENOUS

## 2011-08-27 MED ORDER — WARFARIN SODIUM 7.5 MG PO TABS
7.5000 mg | ORAL_TABLET | Freq: Once | ORAL | Status: AC
  Administered 2011-08-27: 7.5 mg via ORAL
  Filled 2011-08-27 (×2): qty 1

## 2011-08-27 MED ORDER — MUPIROCIN 2 % EX OINT
TOPICAL_OINTMENT | CUTANEOUS | Status: AC
  Administered 2011-08-27: 1 via NASAL
  Filled 2011-08-27: qty 22

## 2011-08-27 MED ORDER — PROPOFOL 10 MG/ML IV EMUL
INTRAVENOUS | Status: DC | PRN
  Administered 2011-08-27: 170 mg via INTRAVENOUS

## 2011-08-27 MED ORDER — METHOCARBAMOL 500 MG PO TABS: 500.0000 mg | ORAL_TABLET | Freq: Four times a day (QID) | ORAL | Status: DC | PRN

## 2011-08-27 MED ORDER — KETOROLAC TROMETHAMINE 30 MG/ML IJ SOLN
15.0000 mg | Freq: Once | INTRAMUSCULAR | Status: AC | PRN
  Administered 2011-08-27: 30 mg via INTRAVENOUS

## 2011-08-27 MED ORDER — MORPHINE SULFATE (PF) 1 MG/ML IV SOLN
INTRAVENOUS | Status: DC
  Administered 2011-08-27: 4 mg via INTRAVENOUS
  Administered 2011-08-27: 2 mg via INTRAVENOUS
  Administered 2011-08-27: 14:00:00 via INTRAVENOUS
  Administered 2011-08-27 (×3): 2 mg via INTRAVENOUS
  Administered 2011-08-28: 1 mg via INTRAVENOUS
  Administered 2011-08-28: 2 mg via INTRAVENOUS

## 2011-08-27 MED ORDER — MEPERIDINE HCL 25 MG/ML IJ SOLN: 6.2500 mg | INTRAMUSCULAR | Status: DC | PRN

## 2011-08-27 MED ORDER — LIDOCAINE HCL (CARDIAC) 20 MG/ML IV SOLN
INTRAVENOUS | Status: DC | PRN
  Administered 2011-08-27: 100 mg via INTRAVENOUS

## 2011-08-27 MED ORDER — ATORVASTATIN CALCIUM 40 MG PO TABS
40.0000 mg | ORAL_TABLET | Freq: Every day | ORAL | Status: DC
  Administered 2011-08-27 – 2011-08-28 (×2): 40 mg via ORAL
  Filled 2011-08-27 (×3): qty 1

## 2011-08-27 MED ORDER — DOCUSATE SODIUM 100 MG PO CAPS
100.0000 mg | ORAL_CAPSULE | Freq: Two times a day (BID) | ORAL | Status: DC
  Administered 2011-08-27 – 2011-08-29 (×4): 100 mg via ORAL
  Filled 2011-08-27 (×6): qty 1

## 2011-08-27 MED ORDER — WARFARIN - PHARMACIST DOSING INPATIENT: Freq: Every day | Status: DC

## 2011-08-27 MED ORDER — ALPRAZOLAM 0.5 MG PO TABS: 1.0000 mg | ORAL_TABLET | Freq: Every evening | ORAL | Status: DC | PRN

## 2011-08-27 MED ORDER — MIDAZOLAM HCL 2 MG/2ML IJ SOLN: 0.5000 mg | Freq: Once | INTRAMUSCULAR | Status: DC | PRN

## 2011-08-27 MED ORDER — BUPIVACAINE HCL (PF) 0.25 % IJ SOLN
INTRAMUSCULAR | Status: DC | PRN
  Administered 2011-08-27: 30 mL

## 2011-08-27 MED ORDER — PROMETHAZINE HCL 25 MG/ML IJ SOLN: 6.2500 mg | INTRAMUSCULAR | Status: DC | PRN

## 2011-08-27 MED ORDER — FENTANYL CITRATE 0.05 MG/ML IJ SOLN
25.0000 ug | INTRAMUSCULAR | Status: DC | PRN
  Administered 2011-08-27 (×3): 50 ug via INTRAVENOUS

## 2011-08-27 MED ORDER — ROCURONIUM BROMIDE 100 MG/10ML IV SOLN
INTRAVENOUS | Status: DC | PRN
  Administered 2011-08-27: 40 mg via INTRAVENOUS

## 2011-08-27 MED ORDER — SODIUM CHLORIDE 0.9 % IR SOLN
Status: DC | PRN
  Administered 2011-08-27: 3000 mL
  Administered 2011-08-27: 1000 mL

## 2011-08-27 MED ORDER — POTASSIUM CHLORIDE IN NACL 20-0.9 MEQ/L-% IV SOLN
INTRAVENOUS | Status: DC
  Administered 2011-08-27 – 2011-08-28 (×3): via INTRAVENOUS
  Filled 2011-08-27 (×6): qty 1000

## 2011-08-27 MED ORDER — ONDANSETRON HCL 4 MG/2ML IJ SOLN: 4.0000 mg | Freq: Four times a day (QID) | INTRAMUSCULAR | Status: DC | PRN

## 2011-08-27 MED ORDER — ONDANSETRON HCL 4 MG PO TABS: 4.0000 mg | ORAL_TABLET | Freq: Four times a day (QID) | ORAL | Status: DC | PRN

## 2011-08-27 MED ORDER — ACETAMINOPHEN 10 MG/ML IV SOLN
INTRAVENOUS | Status: AC
  Filled 2011-08-27: qty 100

## 2011-08-27 MED ORDER — METHOCARBAMOL 100 MG/ML IJ SOLN
500.0000 mg | INTRAVENOUS | Status: DC
  Filled 2011-08-27: qty 5

## 2011-08-27 MED ORDER — MENTHOL 3 MG MT LOZG: 1.0000 | LOZENGE | OROMUCOSAL | Status: DC | PRN

## 2011-08-27 MED ORDER — MORPHINE SULFATE 4 MG/ML IJ SOLN
INTRAMUSCULAR | Status: DC | PRN
  Administered 2011-08-27: 4 mg via INTRAVENOUS

## 2011-08-27 MED ORDER — LIDOCAINE HCL 4 % MT SOLN
OROMUCOSAL | Status: DC | PRN
  Administered 2011-08-27: 4 mL via TOPICAL

## 2011-08-27 MED ORDER — ONDANSETRON HCL 4 MG/2ML IJ SOLN
INTRAMUSCULAR | Status: DC | PRN
  Administered 2011-08-27: 4 mg via INTRAVENOUS

## 2011-08-27 MED ORDER — ONDANSETRON HCL 4 MG/2ML IJ SOLN
4.0000 mg | Freq: Four times a day (QID) | INTRAMUSCULAR | Status: DC | PRN
  Administered 2011-08-28 – 2011-08-29 (×2): 4 mg via INTRAVENOUS
  Filled 2011-08-27 (×2): qty 2

## 2011-08-27 MED ORDER — MUPIROCIN 2 % EX OINT
1.0000 "application " | TOPICAL_OINTMENT | Freq: Two times a day (BID) | CUTANEOUS | Status: DC
  Administered 2011-08-27 – 2011-08-29 (×4): 1 via NASAL
  Filled 2011-08-27: qty 22

## 2011-08-27 SURGICAL SUPPLY — 55 items
BANDAGE ESMARK 6X9 LF (GAUZE/BANDAGES/DRESSINGS) ×1 IMPLANT
BLADE SAG 18X100X1.27 (BLADE) ×4 IMPLANT
BNDG ESMARK 6X9 LF (GAUZE/BANDAGES/DRESSINGS) ×2
BOOTCOVER CLEANROOM LRG (PROTECTIVE WEAR) ×4 IMPLANT
BOWL SMART MIX CTS (DISPOSABLE) ×2 IMPLANT
CEMENT BONE SIMPLEX SPEEDSET (Cement) ×4 IMPLANT
CLOTH BEACON ORANGE TIMEOUT ST (SAFETY) ×2 IMPLANT
COVER BACK TABLE 24X17X13 BIG (DRAPES) ×2 IMPLANT
COVER SURGICAL LIGHT HANDLE (MISCELLANEOUS) ×4 IMPLANT
CUFF TOURNIQUET SINGLE 34IN LL (TOURNIQUET CUFF) ×2 IMPLANT
DRAPE EXTREMITY T 121X128X90 (DRAPE) ×2 IMPLANT
DRAPE PROXIMA HALF (DRAPES) ×2 IMPLANT
DRAPE U-SHAPE 47X51 STRL (DRAPES) ×2 IMPLANT
DRSG PAD ABDOMINAL 8X10 ST (GAUZE/BANDAGES/DRESSINGS) ×2 IMPLANT
DURAPREP 26ML APPLICATOR (WOUND CARE) ×2 IMPLANT
ELECT CAUTERY BLADE 6.4 (BLADE) ×2 IMPLANT
ELECT REM PT RETURN 9FT ADLT (ELECTROSURGICAL) ×2
ELECTRODE REM PT RTRN 9FT ADLT (ELECTROSURGICAL) ×1 IMPLANT
EVACUATOR 1/8 PVC DRAIN (DRAIN) ×2 IMPLANT
FACESHIELD LNG OPTICON STERILE (SAFETY) ×2 IMPLANT
GAUZE XEROFORM 5X9 LF (GAUZE/BANDAGES/DRESSINGS) ×2 IMPLANT
GLOVE BIOGEL PI IND STRL 8 (GLOVE) ×1 IMPLANT
GLOVE BIOGEL PI INDICATOR 8 (GLOVE) ×1
GLOVE ORTHO TXT STRL SZ7.5 (GLOVE) ×2 IMPLANT
GOWN PREVENTION PLUS XLARGE (GOWN DISPOSABLE) ×4 IMPLANT
GOWN STRL NON-REIN LRG LVL3 (GOWN DISPOSABLE) ×4 IMPLANT
GOWN STRL REIN 2XL XLG LVL4 (GOWN DISPOSABLE) ×2 IMPLANT
HANDPIECE INTERPULSE COAX TIP (DISPOSABLE) ×1
IMMOBILIZER KNEE 22 UNIV (SOFTGOODS) ×2 IMPLANT
IMMOBILIZER KNEE 24 THIGH 36 (MISCELLANEOUS) IMPLANT
IMMOBILIZER KNEE 24 UNIV (MISCELLANEOUS)
KIT BASIN OR (CUSTOM PROCEDURE TRAY) ×2 IMPLANT
KIT ROOM TURNOVER OR (KITS) ×2 IMPLANT
MANIFOLD NEPTUNE II (INSTRUMENTS) ×2 IMPLANT
NS IRRIG 1000ML POUR BTL (IV SOLUTION) ×2 IMPLANT
PACK TOTAL JOINT (CUSTOM PROCEDURE TRAY) ×2 IMPLANT
PAD ARMBOARD 7.5X6 YLW CONV (MISCELLANEOUS) ×4 IMPLANT
PAD CAST 4YDX4 CTTN HI CHSV (CAST SUPPLIES) ×1 IMPLANT
PADDING CAST COTTON 4X4 STRL (CAST SUPPLIES) ×1
PADDING CAST COTTON 6X4 STRL (CAST SUPPLIES) ×2 IMPLANT
RUBBERBAND STERILE (MISCELLANEOUS) ×2 IMPLANT
SET HNDPC FAN SPRY TIP SCT (DISPOSABLE) ×1 IMPLANT
SPONGE GAUZE 4X4 12PLY (GAUZE/BANDAGES/DRESSINGS) ×2 IMPLANT
STAPLER VISISTAT 35W (STAPLE) ×2 IMPLANT
SUCTION FRAZIER TIP 10 FR DISP (SUCTIONS) ×2 IMPLANT
SUT VIC AB 1 CTX 36 (SUTURE) ×2
SUT VIC AB 1 CTX36XBRD ANBCTR (SUTURE) ×2 IMPLANT
SUT VIC AB 2-0 CT1 27 (SUTURE) ×2
SUT VIC AB 2-0 CT1 TAPERPNT 27 (SUTURE) ×2 IMPLANT
SYR 30ML LL (SYRINGE) ×2 IMPLANT
SYR 30ML SLIP (SYRINGE) ×2 IMPLANT
TOWEL OR 17X24 6PK STRL BLUE (TOWEL DISPOSABLE) ×2 IMPLANT
TOWEL OR 17X26 10 PK STRL BLUE (TOWEL DISPOSABLE) ×2 IMPLANT
TRAY FOLEY CATH 14FR (SET/KITS/TRAYS/PACK) IMPLANT
WATER STERILE IRR 1000ML POUR (IV SOLUTION) ×2 IMPLANT

## 2011-08-27 NOTE — Progress Notes (Signed)
Orthopedic Tech Progress Note Patient Details:  [name] 1933-10-09 [id number]  Patient ID: [name], male   DOB: Oct 08, 1933, 78 y.o.   MRN: [id number] Viewed order from rn order list  [name] 08/27/2011, 2:53 PM

## 2011-08-27 NOTE — Interval H&P Note (Signed)
History and Physical Interval Note:  08/27/2011 8:17 AM  [name]  has presented today for surgery, with the diagnosis of DJD LEFT KNEE  The various methods of treatment have been discussed with the patient and family. After consideration of risks, benefits and other options for treatment, the patient has consented to  Procedure(s) (LRB): TOTAL KNEE ARTHROPLASTY (Left) as a surgical intervention .  The patients' history has been reviewed, patient examined, no change in status, stable for surgery.  I have reviewed the patients' chart and labs.  Questions were answered to the patient's satisfaction.     Ridhima Golberg F

## 2011-08-27 NOTE — Anesthesia Postprocedure Evaluation (Signed)
Anesthesia Post Note  Patient: [name]  Procedure(s) Performed: Procedure(s) (LRB): TOTAL KNEE ARTHROPLASTY (Left)  Anesthesia type: GA  Patient location: PACU  Post pain: Pain level controlled  Post assessment: Post-op Vital signs reviewed  Last Vitals:  Filed Vitals:   08/27/11 1305  BP: 154/80  Pulse: 69  Temp:   Resp: 13    Post vital signs: Reviewed  Level of consciousness: sedated  Complications: No apparent anesthesia complications

## 2011-08-27 NOTE — Anesthesia Preprocedure Evaluation (Signed)
Anesthesia Evaluation  Patient identified by MRN, date of birth, ID band Patient awake    Reviewed: Allergy & Precautions, H&P , Patient's Chart, lab work & pertinent test results, reviewed documented beta blocker date and time   History of Anesthesia Complications Negative for: history of anesthetic complications  Airway Mallampati: III TM Distance: >3 FB Neck ROM: full    Dental No notable dental hx.    Pulmonary neg pulmonary ROS,  breath sounds clear to auscultation  Pulmonary exam normal       Cardiovascular Exercise Tolerance: Good hypertension, + CAD negative cardio ROS  + dysrhythmias Rhythm:regular Rate:Normal     Neuro/Psych CVA, Residual Symptoms negative neurological ROS  negative psych ROS   GI/Hepatic negative GI ROS, Neg liver ROS,   Endo/Other  negative endocrine ROS  Renal/GU negative Renal ROS     Musculoskeletal   Abdominal   Peds  Hematology negative hematology ROS (+)   Anesthesia Other Findings   Reproductive/Obstetrics negative OB ROS                           Anesthesia Physical Anesthesia Plan  ASA: III  Anesthesia Plan: General ETT   Post-op Pain Management:    Induction:   Airway Management Planned:   Additional Equipment:   Intra-op Plan:   Post-operative Plan:   Informed Consent: I have reviewed the patients History and Physical, chart, labs and discussed the procedure including the risks, benefits and alternatives for the proposed anesthesia with the patient or authorized representative who has indicated his/her understanding and acceptance.   Dental Advisory Given  Plan Discussed with: CRNA and Surgeon  Anesthesia Plan Comments:         Anesthesia Quick Evaluation

## 2011-08-27 NOTE — Transfer of Care (Signed)
Immediate Anesthesia Transfer of Care Note  Patient: [name]  Procedure(s) Performed: Procedure(s) (LRB): TOTAL KNEE ARTHROPLASTY (Left)  Patient Location: PACU  Anesthesia Type: GA combined with regional for post-op pain  Level of Consciousness: awake, alert , oriented, patient cooperative and responds to stimulation  Airway & Oxygen Therapy: Patient Spontanous Breathing and Patient connected to nasal cannula oxygen  Post-op Assessment: Report given to PACU RN, Post -op Vital signs reviewed and stable and Patient moving all extremities  Post vital signs: Reviewed and stable  Complications: No apparent anesthesia complications

## 2011-08-27 NOTE — Progress Notes (Signed)
Orthopedic Tech Progress Note Patient Details:  [name] February 23, 1934 [id number]  CPM Left Knee CPM Left Knee: On Left Knee Flexion (Degrees): 60  Left Knee Extension (Degrees): 0  Additional Comments: will provide ohf when ortho gets more   [name] 08/27/2011, 2:52 PM

## 2011-08-27 NOTE — Brief Op Note (Signed)
08/27/2011  12:49 PM  PATIENT:  [name]  77 y.o. male  PRE-OPERATIVE DIAGNOSIS:  DJD LEFT KNEE  POST-OPERATIVE DIAGNOSIS:  DJD LEFT KNEE  PROCEDURE:  Procedure(s) (LRB): TOTAL KNEE ARTHROPLASTY (Left)  SURGEON:  Surgeon(s) and Role:    * Loreta Ave, MD - Primary  PHYSICIAN ASSISTANT: Zonia Kief M    ANESTHESIA:   general  EBL:  Total I/O In: 200 [I.V.:200] Out: -   BLOOD ADMINISTERED:none  SPECIMEN:  No Specimen  DISPOSITION OF SPECIMEN:  N/A  COUNTS:  YES  TOURNIQUET:   Total Tourniquet Time Documented: Thigh (Left) - 71 minutes   PATIENT DISPOSITION:  PACU - hemodynamically stable.

## 2011-08-27 NOTE — Anesthesia Procedure Notes (Signed)
Anesthesia Regional Block:  Femoral nerve block  Pre-Anesthetic Checklist: ,, timeout performed, Correct Patient, Correct Site, Correct Laterality, Correct Procedure, Correct Position, site marked, Risks and benefits discussed,  Surgical consent,  Pre-op evaluation,  At surgeon's request and post-op pain management  Laterality: Left  Prep: chloraprep       Needles:  Injection technique: Single-shot  Needle Type: Stimiplex          Additional Needles:  Procedures: ultrasound guided Femoral nerve block Narrative:  Start time: 08/27/2011 10:01 AM End time: 08/27/2011 10:14 AM Injection made incrementally with aspirations every 5 mL.  Performed by: Personally   Additional Notes: Risks, benefits and alternative to block explained extensively.  Patient tolerated procedure well, without complications.  Supraclavicular block

## 2011-08-27 NOTE — Progress Notes (Addendum)
ANTICOAGULATION CONSULT NOTE - Initial Consult  Pharmacy Consult for Coumadin Indication: VTE prophylaxis  Allergies  Allergen Reactions  . Niacin     contraindication    Patient Measurements: Wt = 89.2 kg  Vital Signs: Temp: 98 F (36.7 C) (03/20 1550) Temp src: Oral (03/20 0816) BP: 119/61 mmHg (03/20 1550) Pulse Rate: 80  (03/20 1550)  Labs:  Basename 08/27/11 0800  HGB --  HCT --  PLT --  APTT 33  LABPROT 14.8  INR 1.14  HEPARINUNFRC --  CREATININE --  CKTOTAL --  CKMB --  TROPONINI --   The CrCl is unknown because both a height and weight (above a minimum accepted value) are required for this calculation.  Medical History: Past Medical History  Diagnosis Date  . Chickenpox   . Blood transfusion abn reaction or complication, no procedure mishap   . History of colonic polyps     adenomatous  . Coronary artery disease     s/p CABG and Mitral valve repair  . Normal cardiac stress test   . Hyperlipidemia   . Hypertension   . Urinary incontinence   . Onychomycosis   . Central retinal artery occlusion   . CVA (cerebral infarction)   . Atrial fibrillation // Atrial flutter   . History of blood clots   . GI bleed     after colonoscopy/polyp removal  . Anemia, posthemorrhagic, acute   . Prostate cancer   . Blood transfusion     Chills, shaking  . 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 Ulcer   . Insomnia   . 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 Arthritis     Hands    Medications:  Prescriptions prior to admission  Medication Sig Dispense Refill  . ALPRAZolam (XANAX) 1 MG tablet Take 1 tablet (1 mg total) by mouth at bedtime as needed for sleep.  30 tablet  5  . atorvastatin (LIPITOR) 40 MG tablet Take 1 tablet (40 mg total) by mouth daily.  90 tablet  3  . glucosamine-chondroitin 500-400 MG tablet Take 1 tablet by mouth daily.        Marland Kitchen lisinopril (PRINIVIL,ZESTRIL) 20 MG tablet Take 20 mg by mouth daily.       . metoprolol  tartrate (LOPRESSOR) 25 MG tablet Take 12.5 mg by mouth daily.      . Multiple Vitamin (MULTIVITAMIN) capsule Take 1 capsule by mouth daily.        Marland Kitchen warfarin (COUMADIN) 5 MG tablet 5-7.5 mg. Take 1.5 tablets on mondays and fridays. Take 1 tablet the rest of the week      . aspirin 81 MG tablet Take 81 mg by mouth daily.       Marland Kitchen enoxaparin (LOVENOX) 80 MG/0.8ML SOLN injection Inject 0.8 mLs (80 mg total) into the skin every 12 (twelve) hours.  6 Syringe  0  . traMADol (ULTRAM) 50 MG tablet Take 50 mg by mouth as needed. For pain        Assessment: 77 year on Coumadin PTA for Afib, now s/p TKA Dose PTA = 7.5 mg M,F, 5 mg other days  Goal of Therapy:  INR 2-3   Plan:  1) Coumadin 7.5 mg po x 1 dose tonight 2) Daily INR 3) Continue Lovenox 30 mg sq Q 12 hours  Elwin Sleight 08/27/2011,4:38 PM

## 2011-08-28 ENCOUNTER — Inpatient Hospital Stay (HOSPITAL_COMMUNITY): Payer: Medicare Other

## 2011-08-28 ENCOUNTER — Encounter (HOSPITAL_COMMUNITY): Payer: Self-pay | Admitting: Orthopedic Surgery

## 2011-08-28 DIAGNOSIS — Z471 Aftercare following joint replacement surgery: Secondary | ICD-10-CM | POA: Diagnosis not present

## 2011-08-28 DIAGNOSIS — Z96659 Presence of unspecified artificial knee joint: Secondary | ICD-10-CM | POA: Diagnosis not present

## 2011-08-28 LAB — CBC
Platelets: 150 10*3/uL (ref 150–400)
RDW: 13.9 % (ref 11.5–15.5)
WBC: 9.8 10*3/uL (ref 4.0–10.5)

## 2011-08-28 LAB — BASIC METABOLIC PANEL
Chloride: 103 mEq/L (ref 96–112)
GFR calc Af Amer: >90 mL/min (ref >90)
GFR calc non Af Amer: 78 mL/min — ABNORMAL LOW (ref >90)
Potassium: 4.3 mEq/L (ref 3.5–5.1)
Sodium: 137 mEq/L (ref 135–145)

## 2011-08-28 LAB — PROTIME-INR
INR: 1.22 (ref 0.00–1.49)
Prothrombin Time: 15.7 seconds — ABNORMAL HIGH (ref 11.6–15.2)

## 2011-08-28 MED ORDER — WARFARIN SODIUM 7.5 MG PO TABS
7.5000 mg | ORAL_TABLET | Freq: Once | ORAL | Status: AC
  Administered 2011-08-28: 7.5 mg via ORAL
  Filled 2011-08-28: qty 1

## 2011-08-28 MED ORDER — OXYCODONE-ACETAMINOPHEN 5-325 MG PO TABS
1.0000 | ORAL_TABLET | ORAL | Status: DC | PRN
  Administered 2011-08-28: 1 via ORAL
  Administered 2011-08-28 – 2011-08-29 (×5): 2 via ORAL
  Administered 2011-08-29: 1 via ORAL
  Filled 2011-08-28: qty 2
  Filled 2011-08-28: qty 1
  Filled 2011-08-28 (×5): qty 2

## 2011-08-28 MED FILL — Morphine Sulfate Inj 4 MG/ML: INTRAMUSCULAR | Qty: 1 | Status: AC

## 2011-08-28 NOTE — Discharge Instructions (Addendum)
Home Health Services will be provided by [facility] [phone].  1. Physical Therapy 2. Registered Nurse  Durable Medical Equipment delivered to home.  1. Rolling Walker 2. 3-n-1 3 CPM

## 2011-08-28 NOTE — Evaluation (Signed)
Physical Therapy Evaluation Patient Details Name: [name] MRN: [id number] DOB: 02-26-1934 Today's Date: 08/28/2011  Problem List:  Patient Active Problem List  Diagnoses  . HISTORY OF PROSTATE CANCER  . HYPERLIPIDEMIA  . HYPERTENSION  . CORONARY ARTERY DISEASE  . URINARY INCONTINENCE  . ADENOMATOUS COLONIC POLYPS, HX OF WITH ? OF FOCAL INVASIVE CARCINOMA  . ATRIAL FLUTTER/FIB  . HISTORY OF CVA IN SETTING OF AFLUTTER  . Warfarin anticoagulation  . Constipation    Past Medical History:  Past Medical History  Diagnosis Date  . Chickenpox   . Blood transfusion abn reaction or complication, no procedure mishap   . History of colonic polyps     adenomatous  . Coronary artery disease     s/p CABG and Mitral valve repair  . Normal cardiac stress test   . Hyperlipidemia   . Hypertension   . Urinary incontinence   . Onychomycosis   . Central retinal artery occlusion   . CVA (cerebral infarction)   . Atrial fibrillation // Atrial flutter   . History of blood clots   . GI bleed     after colonoscopy/polyp removal  . Anemia, posthemorrhagic, acute   . Prostate cancer   . Blood transfusion     Chills, shaking  . 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 Ulcer   . Insomnia   . 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 Arthritis     Hands   Past Surgical History:  Past Surgical History  Procedure Date  . Mitral valve replacement 2007  . Radical prostatectomy with radiation 2003  . A flutter ablation 2010  . Bladder pump installed 2011    Dr. McDiarmid  . Colonoscopy w/ biopsies and polypectomy 12/17/2010, 12/24/2010    adenomatous polyps and one focal carcinoma in situ, external hemorrhoids, repeat colonoscopy to control post-polypectomy bleed  . Inguinal hernia repair 1993    bilatera  . Colonoscopy 06/23/2011    Procedure: COLONOSCOPY;  Surgeon: Iva Boop, MD;  Location: [facility];  Service: Endoscopy;  Laterality: N/A;  . Mitral valve ring  placement     Per pt he did not have a mitral valve replacement  . Coronary artery bypass graft 1989    4 vessels  . Coronary artery bypass graft 2007    and mitral valve replacement   . Eye surgery     Catarat with Lens Bil  . Cancer of right thumb nail     PT Assessment/Plan/Recommendation PT Assessment Clinical Impression Statement: Patient is a 78 y.o male s/p left TKR. Patient currently presents with decreased left knee AROM and PROM, acute surgical pain, decreased functional strength, decreased knowledge of use of DME, and overall decreased activity tolerance resulting in decreased functional independece. PT to follow at frequency of 7x/week with BID treatments as able during acute hospital stay. Recommend follow-up HHPT and continued use of RW for mobility at discharge. PT Recommendation/Assessment: Patient will need skilled PT in the acute care venue PT Problem List: Decreased strength;Decreased range of motion;Decreased activity tolerance;Decreased balance;Decreased mobility;Decreased knowledge of use of DME;Pain Barriers to Discharge: None PT Therapy Diagnosis : Difficulty walking;Generalized weakness;Acute pain PT Plan PT Frequency: 7X/week (with BID treatments as able) PT Treatment/Interventions: DME instruction;Gait training;Stair training;Functional mobility training;Therapeutic activities;Therapeutic exercise;Balance training;Patient/family education PT Recommendation Follow Up Recommendations: Home health PT Equipment Recommended: Rolling walker with 5" wheels PT Goals  Acute Rehab PT Goals PT Goal Formulation: With patient  Time For Goal Achievement: 7 days Pt will go Supine/Side to Sit: with modified independence;with HOB not 0 degrees (comment degree) PT Goal: Supine/Side to Sit - Progress: Goal set today Pt will go Sit to Supine/Side: with modified independence;with HOB 0 degrees PT Goal: Sit to Supine/Side - Progress: Goal set today Pt will go Sit to Stand: with  modified independence;with upper extremity assist PT Goal: Sit to Stand - Progress: Goal set today Pt will go Stand to Sit: with modified independence;with upper extremity assist PT Goal: Stand to Sit - Progress: Goal set today Pt will Transfer Bed to Chair/Chair to Bed: with modified independence PT Transfer Goal: Bed to Chair/Chair to Bed - Progress: Goal set today Pt will Ambulate: >150 feet;with modified independence;with least restrictive assistive device PT Goal: Ambulate - Progress: Goal set today Pt will Go Up / Down Stairs: 3-5 stairs;with modified independence;with rail(s) (left rail or RW for home entry) PT Goal: Up/Down Stairs - Progress: Goal set today Pt will Perform Home Exercise Program: with min assist PT Goal: Perform Home Exercise Program - Progress: Goal set today  PT Evaluation Precautions/Restrictions  Precautions Precautions: Fall Required Braces or Orthoses: Yes Knee Immobilizer: On except when in CPM Restrictions Weight Bearing Restrictions: Yes LLE Weight Bearing: Weight bearing as tolerated Prior Functioning  Home Living Lives With: Spouse;Other (Comment) (13 y.o grandaughter) Receives Help From: Family Type of Home: House Home Layout: Two level;Able to live on main level with bedroom/bathroom Alternate Level Stairs-Rails: Left Alternate Level Stairs-Number of Steps: full flight Home Access: Stairs to enter Entrance Stairs-Rails: Left Entrance Stairs-Number of Steps: 2-3 wide steps, RW can fit on each step Bathroom Shower/Tub: Tub/shower unit;Curtain (downstairs, walk-in shower with door upstairs in Child psychotherapist) Firefighter: Standard Bathroom Accessibility: Yes How Accessible: Accessible via walker Home Adaptive Equipment: Straight cane Prior Function Level of Independence: Requires assistive device for independence (modified independent with SPC ) Driving: Yes Vocation: Retired Comments: due to left knee pain, patient used [facility] for all mobility PTA.  Was going to [facility] regularly prior to TKA in preparation for the surgery. Cognition Cognition Arousal/Alertness: Awake/alert Overall Cognitive Status: Appears within functional limits for tasks assessed Orientation Level: Oriented X4 Cognition - Other Comments: very motivated Sensation/Coordination Sensation Light Touch: Appears Intact Stereognosis: Not tested Hot/Cold: Not tested Proprioception: Appears Intact Coordination Gross Motor Movements are Fluid and Coordinated: Yes Fine Motor Movements are Fluid and Coordinated: Yes Extremity Assessment RLE Assessment RLE Assessment: Within Functional Limits (AROM WNL, strength grossly 4/5) LLE Assessment LLE Assessment: Exceptions to [facility] (knee flexion/extension limited approximately 25%, strength 3/5 in available range) Mobility (including Balance) Bed Mobility Bed Mobility: Yes Supine to Sit: 4: Min assist;With rails;HOB flat Supine to Sit Details (indicate cue type and reason): assist for left LE management Sitting - Scoot to Edge of Bed: 6: Modified independent (Device/Increase time) (with bilat UE assist) Sit to Supine: 4: Min assist;With rail;HOB flat Sit to Supine - Details (indicate cue type and reason): assist for left LE management Transfers Transfers: Yes Sit to Stand: 4: Min assist;From bed;With upper extremity assist Sit to Stand Details (indicate cue type and reason): focus on left LE position and UE placement for increased functional independence and pain control Stand to Sit: 4: Min assist;With upper extremity assist;To bed Stand to Sit Details: verbal cues for left LE position and hand placement for increased functional independence Ambulation/Gait Ambulation/Gait: Yes Ambulation/Gait Assistance: 4: Min assist Ambulation/Gait Assistance Details (indicate cue type and reason): verbal cues for step through pattern  and upright posture for more typical and symmpetrical gait pattern Ambulation Distance (Feet): 135  Feet Assistive device: Rolling walker;Other (Comment) (left KI) Gait Pattern: Step-through pattern;Decreased stance time - left (mild decreased left foot clearance) Gait velocity: decreased compared to baseline per patient report Stairs: Yes Stairs Assistance: 4: Min assist Stair Management Technique: Two rails;Step to pattern (focus on sequencing and foot placement for home entry) Number of Stairs: 2  Height of Stairs: 6  Wheelchair Mobility Wheelchair Mobility: No (gait is primary means of mobility)  Posture/Postural Control Posture/Postural Control: No significant limitations Balance Balance Assessed: Yes Static Sitting Balance Static Sitting - Balance Support: No upper extremity supported;Feet supported Static Sitting - Level of Assistance: 7: Independent Dynamic Sitting Balance Dynamic Sitting - Balance Support: Right upper extremity supported;Feet supported;During functional activity Dynamic Sitting - Level of Assistance: 6: Modified independent (Device/Increase time) Static Standing Balance Static Standing - Balance Support: Right upper extremity supported Static Standing - Level of Assistance: 5: Stand by assistance Dynamic Standing Balance Dynamic Standing - Balance Support: Right upper extremity supported;During functional activity Dynamic Standing - Level of Assistance: 4: Min assist High Level Balance High Level Balance Activites: Turns High Level Balance Comments: with RW, focus on RW position and sequencing for increased safety and funcitonal indepepence in preparation for household negotiation Exercise  In room HEP initiated for increased functional knee ROM and prevention on complications of decreased mobility. Patient verbalized and demonstrated understanding x 10-15 reps each without PT assist. Total Joint Exercises Ankle Circles/Pumps: AROM;Left;15 reps;Supine Quad Sets: AROM;Left;10 reps;Supine Gluteal Sets: AROM;Both;10 reps;Supine End of Session PT - End of  Session Equipment Utilized During Treatment: Gait belt;Left knee immobilizer;Other (comment) (RW) Activity Tolerance: Patient tolerated treatment well Patient left: in bed;with call bell in reach Nurse Communication: Mobility status for transfers;Mobility status for ambulation General Behavior During Session: [facility] for tasks performed Cognition: [facility] for tasks performed  Romeo Rabon 08/28/2011, 9:21 AM

## 2011-08-28 NOTE — Progress Notes (Signed)
   CARE MANAGEMENT NOTE 08/28/2011  Patient:  [name], [name]   Account Number:  [id number]  Date Initiated:  08/28/2011  Documentation initiated by:  GRAVES-BIGELOW,Richmond Coldren  Subjective/Objective Assessment:   Pt admitted for left total knee replacement. Pt is from home with wife. Plan for d/c home Sat with [facility] for PT /RN that was preoperatively set up via MD. Genevieve Norlander is aware.     Action/Plan:   CM did make sure that pt is set up with equipment and it has been delivered to home: cpm, rw and 3n1. No other needs assessed by CM at this time.   Anticipated DC Date:  08/30/2011   Anticipated DC Plan:  HOME W HOME HEALTH SERVICES      DC Planning Services  CM consult      PAC Choice  DURABLE MEDICAL EQUIPMENT  HOME HEALTH   Choice offered to / List presented to:  C-1 Patient   DME arranged  3-N-1  CPM  [facility]      DME agency  [employer]     HH arranged  HH-2 PT  HH-1 RN  HH-10 DISEASE MANAGEMENT      Status of service:  Completed, signed off Medicare Important Message given?   (If response is "NO", the following Medicare IM given date fields will be blank) Date Medicare IM given:   Date Additional Medicare IM given:    Discharge Disposition:  HOME W HOME HEALTH SERVICES  Per UR Regulation:    If discussed at Long Length of Stay Meetings, dates discussed:    Comments:  08-28-11 1539 Tomi Bamberger, RN,BSN [phone] Pt will have RN services since on coumadin for PT/INR lab draws. No other needs  assessed by CM at this time.

## 2011-08-28 NOTE — Progress Notes (Signed)
Physical Therapy Treatment Patient Details Name: [name] MRN: [id number] DOB: March 02, 1934 Today's Date: 08/28/2011  PT Assessment/Plan  PT - Assessment/Plan Comments on Treatment Session: Wife present for second session this morning. Focus of session on hands on family education to facilitate safe transition home. Discussed continued use of RW for all mobility, follow up HHPT, HEP, frequent mobility, left LE position for rest and sleeping in extension, donning/doffing of left KI, and wearing left KI at all times except when in CPM. Patient and wife verbalized and demonstrated understanding without PT assist. Patient and wife very motivated. PT Plan: Discharge plan remains appropriate PT Frequency: 7X/week (with BID treatments as able) Follow Up Recommendations: Home health PT Equipment Recommended: Rolling walker with 5" wheels PT Goals  Acute Rehab PT Goals PT Goal Formulation: With patient Time For Goal Achievement: 7 days Pt will go Supine/Side to Sit: with modified independence;with HOB not 0 degrees (comment degree) PT Goal: Supine/Side to Sit - Progress: Progressing toward goal Pt will go Sit to Supine/Side: with modified independence;with HOB 0 degrees PT Goal: Sit to Supine/Side - Progress: Progressing toward goal Pt will go Sit to Stand: with modified independence;with upper extremity assist PT Goal: Sit to Stand - Progress: Progressing toward goal Pt will go Stand to Sit: with modified independence;with upper extremity assist PT Goal: Stand to Sit - Progress: Progressing toward goal Pt will Transfer Bed to Chair/Chair to Bed: with modified independence PT Transfer Goal: Bed to Chair/Chair to Bed - Progress: Progressing toward goal Pt will Ambulate: >150 feet;with modified independence;with least restrictive assistive device PT Goal: Ambulate - Progress: Progressing toward goal Pt will Go Up / Down Stairs: 3-5 stairs;with modified independence;with rail(s) (left rail or RW  for home entry) PT Goal: Up/Down Stairs - Progress: Progressing toward goal Pt will Perform Home Exercise Program: with min assist PT Goal: Perform Home Exercise Program - Progress: Progressing toward goal  PT Treatment Precautions/Restrictions  Precautions Precautions: Fall Required Braces or Orthoses: Yes Knee Immobilizer: On except when in CPM Restrictions Weight Bearing Restrictions: Yes LLE Weight Bearing: Weight bearing as tolerated Mobility (including Balance) Bed Mobility Bed Mobility: Yes Supine to Sit: 4: Min assist;With rails;HOB flat Supine to Sit Details (indicate cue type and reason): assist for left LE management Sitting - Scoot to Edge of Bed: 6: Modified independent (Device/Increase time) (with bilat UE assist) Sit to Supine: 4: Min assist;With rail;HOB flat Sit to Supine - Details (indicate cue type and reason): assist for left LE management Transfers Transfers: Yes Sit to Stand: 4: Min assist;From bed;With upper extremity assist Sit to Stand Details (indicate cue type and reason): focus on left LE position and UE placement for increased functional independence and pain control Stand to Sit: 4: Min assist;With upper extremity assist;To bed Stand to Sit Details: verbal cues for left LE position and hand placement for increased functional independence Ambulation/Gait Ambulation/Gait: Yes Ambulation/Gait Assistance: 5: Supervision Ambulation/Gait Assistance Details (indicate cue type and reason): verbal cues for step through pattern and upright posture for more typical and symmpetrical gait pattern Ambulation Distance (Feet): 200 Feet Assistive device: Rolling walker;Other (Comment) (left KI) Gait Pattern: Step-through pattern;Decreased stance time - left Gait velocity: mild decreased left foot clearance Stairs: Yes Stairs Assistance: 5: Supervision Stairs Assistance Details (indicate cue type and reason): sideways with left rail for home entry through garage  door, focus on foot placement and sequencing Stair Management Technique: Sideways;One rail Left Number of Stairs: 4  Height of Stairs: 6  Wheelchair Mobility  Wheelchair Mobility: No (gait is primary means of mobility)  Posture/Postural Control Posture/Postural Control: No significant limitations High Level Balance High Level Balance Activites: Sidestepping left and right to simulate bathroom entry at home, close supervision High Level Balance Comments: with RW, focus on RW position and sequencing for increased safety and funcitonal indepepence in preparation for household negotiation PT - End of Session Equipment Utilized During Treatment: Gait belt;Left knee immobilizer;Other (comment) (RW) Activity Tolerance: Patient tolerated treatment well Patient left: in chair;with family/visitor present;with call bell in reach Nurse Communication: Mobility status for transfers;Mobility status for ambulation General Behavior During Session: [facility] for tasks performed Cognition: [facility] for tasks performed  Romeo Rabon 08/28/2011, 11:24 AM

## 2011-08-28 NOTE — Progress Notes (Signed)
Subjective: Doing well.  Pain controlled.  No complaints.  Objective: Vital signs in last 24 hours: Temp:  [97.2 F (36.2 C)-98 F (36.7 C)] 97.8 F (36.6 C) (03/21 0639) Pulse Rate:  [62-86] 79  (03/21 0639) Resp:  [11-24] 14  (03/21 0834) BP: (98-154)/(49-81) 98/54 mmHg (03/21 0639) SpO2:  [97 %-100 %] 99 % (03/21 0834)  Intake/Output from previous day: 03/20 0701 - 03/21 0700 In: 2360 [P.O.:720; I.V.:1640] Out: 725 [Urine:500; Drains:225] Intake/Output this shift: Total I/O In: 240 [P.O.:240] Out: -    Basename 08/28/11 0620  HGB 10.2*    Basename 08/28/11 0620  WBC 9.8  RBC 3.35*  HCT 29.6*  PLT 150    Basename 08/28/11 0620  NA 137  K 4.3  CL 103  CO2 27  BUN 13  CREATININE 0.98  GLUCOSE 148*  CALCIUM 8.6    Basename 08/28/11 0620 08/27/11 0800  LABPT -- --  INR 1.22 1.14    Neurovascular intact Compartment soft  Assessment/Plan: PT today.  Anticipate d/c home tomorrow.     [name] M 08/28/2011, 11:12 AM

## 2011-08-28 NOTE — Op Note (Signed)
NAMEAJANI, RINEER NO.:  [id number]  MEDICAL RECORD NO.:  [id number]  LOCATION:  5018                         FACILITY:  [facility]  PHYSICIAN:  Loreta Ave, M.D. DATE OF BIRTH:  [birth year]  DATE OF PROCEDURE:  08/27/2011 DATE OF DISCHARGE:                              OPERATIVE REPORT   PREOPERATIVE DIAGNOSES:  Left knee end-stage degenerative arthritis, varus alignment.  POSTOPERATIVE DIAGNOSES:  Left knee end-stage degenerative arthritis, varus alignment.  PROCEDURE:  Modified minimally invasive left total knee replacement, Stryker triathlon prosthesis.  Soft tissue balancing.  Cemented pegged posterior stabilized #5 femoral component.  Cemented #6 tibial component, 9-mm polyethylene insert.  Cemented resurfacing, 35-mm patellar component.  SURGEON:  Loreta Ave, MD  ASSISTANT:  Genene Churn. Barry Dienes, [place], present throughout the entire case, necessary for timely completion of procedure.  ANESTHESIA:  General.  BLOOD LOSS:  Minimal.  SPECIMENS:  None.  CULTURES:  None.  COMPLICATION:  None.  DRESSINGS:  Soft compressive knee immobilizer.  DRAINS:  Hemovac x1.  TOURNIQUET TIME:  1 hour.  PROCEDURE:  The patient was brought to the operating room, placed on the operating table in supine position.  After adequate anesthesia had been obtained, tourniquet applied, prepped and draped in usual sterile fashion.  Exsanguinated with elevation and Esmarch.  Tourniquet inflated to 350 mmHg.  Straight incision above the patella down to the tibial tubercle.  Skin and subcutaneous tissue divided.  Medial arthrotomy, vastus splitting, preserving quad tendon.  Knee exposed.  Grade 4 changes especially medial.  A 5 degrees of varus partially correctable. Medial capsule released.  Distal femoral resection 8 mm set at 5 degrees of valgus.  Using the epicondylar axis, the femur was sized, cut, and fitted for a cemented pegged.  Posterior stabilized, #5  component. Proximal and tibial resection, 3-degree posterior slope, extramedullary guide.  Size #6 component.  Patella exposed.  Posterior 10 mm removed. Spurs removed.  Drilled sized and fitted for a 35-mm component.  Debris cleared in all recesses.  Trials put in place.  A #5 above and 6 below, 9 mm insert and 35 patella.  With this construct, excellent stability, good biomechanical axis, nicely balanced in flexion and extension.  Good patellofemoral tracking.  Tibia was marked for rotation using trials. Hand reamed.  All trials were removed.  Copious irrigation with pulse irrigating device.  Cement prepared, placed on all components, and firmly seated.  Polyethylene attached to the tibia and knee reduced. Patella held with a clamp.  Once cement hardened, the knee was reexamined.  Again, pleased with alignment and stability.  Wound irrigated.  Hemovac placed through a separate stab wound.  Arthrotomy closed with #1 Vicryl, skin and subcutaneous tissue with Vicryl and staples.  Sterile compressive dressing applied.  Tourniquet deflated and removed.  Knee immobilizer applied.  Anesthesia reversed.  Brought to the recovery room.  Tolerated the surgery well.  No complications.     Loreta Ave, M.D.     DFM/MEDQ  D:  08/27/2011  T:  08/28/2011  Job:  [id number]

## 2011-08-28 NOTE — Progress Notes (Signed)
ANTICOAGULATION CONSULT NOTE - Follow-Up Consult  Pharmacy Consult for Coumadin Indication: VTE prophylaxis s/p L TKA  Allergies  Allergen Reactions  . Niacin     contraindication    Patient Measurements: Wt = 89.2 kg  Vital Signs: Temp: 97.8 F (36.6 C) (03/21 0639) BP: 98/54 mmHg (03/21 0639) Pulse Rate: 79  (03/21 0639)  Labs:  Basename 08/28/11 0620 08/27/11 0800  HGB 10.2* --  HCT 29.6* --  PLT 150 --  APTT -- 33  LABPROT 15.7* 14.8  INR 1.22 1.14  HEPARINUNFRC -- --  CREATININE 0.98 --  CKTOTAL -- --  CKMB -- --  TROPONINI -- --   Creatinine clearance ~ 60 ml/min  Assessment: 78 y/o male on Coumadin PTA for Afib, now s/p L TKA Coumadin dose PTA = 7.5 mg Mon and Fri, 5 mg other days. Pt on lovenox bridge until INR therapeutic. INR 1.22 past coumadin resumed yesterday  Goal of Therapy:  INR 2-3   Plan:  1) Coumadin 7.5 mg po x 1 dose tonight 2) Daily INR 3) Continue Lovenox 30 mg sq Q 12 hours  Christoper Fabian, PharmD, BCPS Clinical pharmacist, pager [phone] 08/28/2011,9:40 AM

## 2011-08-29 LAB — BASIC METABOLIC PANEL
CO2: 26 mEq/L (ref 19–32)
Calcium: 8.7 mg/dL (ref 8.4–10.5)
Chloride: 99 mEq/L (ref 96–112)
GFR calc Af Amer: 82 mL/min — ABNORMAL LOW (ref >90)
Sodium: 133 mEq/L — ABNORMAL LOW (ref 135–145)

## 2011-08-29 LAB — CBC
MCH: 30.4 pg (ref 26.0–34.0)
Platelets: 138 10*3/uL — ABNORMAL LOW (ref 150–400)
RBC: 3.03 MIL/uL — ABNORMAL LOW (ref 4.22–5.81)
RDW: 14.1 % (ref 11.5–15.5)
WBC: 11.9 10*3/uL — ABNORMAL HIGH (ref 4.0–10.5)

## 2011-08-29 LAB — PROTIME-INR: Prothrombin Time: 17.9 seconds — ABNORMAL HIGH (ref 11.6–15.2)

## 2011-08-29 MED ORDER — WARFARIN SODIUM 7.5 MG PO TABS
7.5000 mg | ORAL_TABLET | Freq: Once | ORAL | Status: DC
  Filled 2011-08-29: qty 1

## 2011-08-29 NOTE — Progress Notes (Signed)
Physical Therapy Treatment Patient Details Name: [name] MRN: [id number] DOB: 1933-09-08 Today's Date: 08/29/2011  PT Assessment/Plan  PT - Assessment/Plan Comments on Treatment Session: Tx focused on ther-ex for strength and ROM, handout provided. Pt continues to porgress in therapy, and is able to demonstrate safety with mobility with min cues.  PT Plan: Discharge plan remains appropriate PT Frequency: 7X/week Follow Up Recommendations: Home health PT Equipment Recommended: Rolling walker with 5" wheels PT Goals  Acute Rehab PT Goals PT Goal: Supine/Side to Sit - Progress: Progressing toward goal PT Goal: Sit to Stand - Progress: Progressing toward goal PT Goal: Stand to Sit - Progress: Progressing toward goal PT Goal: Ambulate - Progress: Progressing toward goal PT Goal: Perform Home Exercise Program - Progress: Progressing toward goal  PT Treatment Precautions/Restrictions  Precautions Precautions: Fall Required Braces or Orthoses: Yes Knee Immobilizer: On except when in CPM Restrictions Weight Bearing Restrictions: Yes LLE Weight Bearing: Weight bearing as tolerated Mobility (including Balance) Bed Mobility Supine to Sit: 5: Supervision;HOB flat Supine to Sit Details (indicate cue type and reason): Cues for sequence only, increased time required Transfers Sit to Stand: 5: Supervision;With upper extremity assist;With armrests;From bed;From toilet Sit to Stand Details (indicate cue type and reason): Cues for LE placement and safe hand placement Stand to Sit: 5: Supervision;With upper extremity assist;To toilet;To chair/3-in-1;With armrests Stand to Sit Details: Cues to bring RW to complete turn to suit Ambulation/Gait Ambulation/Gait Assistance: 5: Supervision Ambulation/Gait Assistance Details (indicate cue type and reason): Cues for posture Ambulation Distance (Feet): 15 Feet (x2. Pt nauseated this tx) Assistive device: Standard walker Gait Pattern:  Step-through pattern;Decreased stance time - left;Trunk flexed Stairs: No    Exercise  Total Joint Exercises Ankle Circles/Pumps: Supine;20 reps;Both;Strengthening;AROM Quad Sets: Supine;10 reps;Left;Strengthening;AROM Short Arc Quad: Seated;10 reps;Left;Strengthening;AROM Heel Slides: Seated;10 reps;Left;Strengthening;AAROM Straight Leg Raises: Supine;10 reps;Left;Strengthening;AAROM Long Arc Quad: Seated;10 reps;Left;Strengthening;AAROM Knee Flexion: Seated;10 reps;Left;AAROM End of Session PT - End of Session Equipment Utilized During Treatment: Gait belt;Left knee immobilizer Activity Tolerance: Patient tolerated treatment well Patient left: in chair;with call bell in reach;with family/visitor present Nurse Communication: Mobility status for transfers;Mobility status for ambulation General Behavior During Session: [facility] for tasks performed Cognition: [facility] for tasks performed  Virl Cagey, [place] [phone]  08/29/2011, 2:15 PM

## 2011-08-29 NOTE — Progress Notes (Signed)
ANTICOAGULATION CONSULT NOTE - Follow-Up Consult  Pharmacy Consult for Coumadin Indication: VTE prophylaxis s/p L TKA  Allergies  Allergen Reactions  . Niacin     contraindication    Patient Measurements: Wt = 89.2 kg  Vital Signs: Temp: 97.8 F (36.6 C) (03/22 0618) Temp src: Oral (03/21 2124) BP: 110/60 mmHg (03/22 0618) Pulse Rate: 98  (03/22 0618)  Labs:  Basename 08/29/11 0543 08/28/11 0620 08/27/11 0800  HGB 9.2* 10.2* --  HCT 26.9* 29.6* --  PLT 138* 150 --  APTT -- -- 33  LABPROT 17.9* 15.7* 14.8  INR 1.45 1.22 1.14  HEPARINUNFRC -- -- --  CREATININE 1.01 0.98 --  CKTOTAL -- -- --  CKMB -- -- --  TROPONINI -- -- --   Creatinine clearance ~ 60 ml/min  Assessment: 78 y/o male on Coumadin PTA for Afib, now s/p L TKA Coumadin dose PTA = 7.5 mg Mon and Fri, 5 mg other days. Pt on lovenox bridge until INR therapeutic. INR 1.45  Goal of Therapy:  INR 2-3   Plan:  1) Coumadin 7.5 mg po x 1 dose tonight 2) Daily INR 3) Continue Lovenox 30 mg sq Q 12 hours  Talbert Cage, PharmD Clinical pharmacist, pager [phone] 08/29/2011,9:02 AM

## 2011-08-29 NOTE — Progress Notes (Signed)
Orthopedic Tech Progress Note Patient Details:  [name] 03-31-1934 [id number]  Patient ID: [name], male   DOB: August 06, 1933, 78 y.o.   MRN: [id number] Confirmed patient has knee immobilizer.  Leo Grosser T 08/29/2011, 12:55 PM

## 2011-08-29 NOTE — Progress Notes (Signed)
Chart reviewed, spoke with pt., no OT needs identified.  Will sign off.  Jeani Hawking, OTR/L [phone]

## 2011-08-29 NOTE — Progress Notes (Signed)
Subjective: Doing great.  No complaints.  Ready to go home.   Objective: Vital signs in last 24 hours: Temp:  [97.4 F (36.3 C)-98.3 F (36.8 C)] 97.8 F (36.6 C) (03/22 0618) Pulse Rate:  [87-98] 98  (03/22 0618) Resp:  [14-20] 20  (03/22 0618) BP: (110-129)/(45-68) 110/60 mmHg (03/22 0618) SpO2:  [87 %-99 %] 90 % (03/22 0618)  Intake/Output from previous day: 03/21 0701 - 03/22 0700 In: 360 [P.O.:360] Out: 250 [Emesis/NG output:100; Drains:150] Intake/Output this shift:     Basename 08/29/11 0543 08/28/11 0620  HGB 9.2* 10.2*    Basename 08/29/11 0543 08/28/11 0620  WBC 11.9* 9.8  RBC 3.03* 3.35*  HCT 26.9* 29.6*  PLT 138* 150    Basename 08/29/11 0543 08/28/11 0620  NA 133* 137  K 4.5 4.3  CL 99 103  CO2 26 27  BUN 11 13  CREATININE 1.01 0.98  GLUCOSE 133* 148*  CALCIUM 8.7 8.6    Basename 08/29/11 0543 08/28/11 0620  LABPT -- --  INR 1.45 1.22    Wound looks good.  Staples intact.  No drainage or signs of infection.  Calf nontender.   Assessment/Plan: D/c home today.  F/u 2 wks postop.     Jamieson Lisa M 08/29/2011, 8:24 AM

## 2011-08-29 NOTE — Plan of Care (Signed)
Problem: Discharge Progression Outcomes Goal: Other Discharge Outcomes/Goals Outcome: Completed/Met Date Met:  08/29/11 INR Education

## 2011-08-30 DIAGNOSIS — Z96659 Presence of unspecified artificial knee joint: Secondary | ICD-10-CM | POA: Diagnosis not present

## 2011-08-30 DIAGNOSIS — I10 Essential (primary) hypertension: Secondary | ICD-10-CM | POA: Diagnosis not present

## 2011-08-30 DIAGNOSIS — I2510 Atherosclerotic heart disease of native coronary artery without angina pectoris: Secondary | ICD-10-CM | POA: Diagnosis not present

## 2011-08-30 DIAGNOSIS — Z471 Aftercare following joint replacement surgery: Secondary | ICD-10-CM | POA: Diagnosis not present

## 2011-08-30 DIAGNOSIS — M1710 Unilateral primary osteoarthritis, unspecified knee: Secondary | ICD-10-CM | POA: Diagnosis not present

## 2011-08-30 DIAGNOSIS — I4891 Unspecified atrial fibrillation: Secondary | ICD-10-CM | POA: Diagnosis not present

## 2011-09-01 ENCOUNTER — Ambulatory Visit: Payer: Self-pay | Admitting: *Deleted

## 2011-09-01 DIAGNOSIS — I639 Cerebral infarction, unspecified: Secondary | ICD-10-CM

## 2011-09-01 DIAGNOSIS — Z96659 Presence of unspecified artificial knee joint: Secondary | ICD-10-CM | POA: Diagnosis not present

## 2011-09-01 DIAGNOSIS — Z471 Aftercare following joint replacement surgery: Secondary | ICD-10-CM | POA: Diagnosis not present

## 2011-09-01 DIAGNOSIS — I4892 Unspecified atrial flutter: Secondary | ICD-10-CM

## 2011-09-01 DIAGNOSIS — I10 Essential (primary) hypertension: Secondary | ICD-10-CM | POA: Diagnosis not present

## 2011-09-01 DIAGNOSIS — I2510 Atherosclerotic heart disease of native coronary artery without angina pectoris: Secondary | ICD-10-CM | POA: Diagnosis not present

## 2011-09-01 DIAGNOSIS — I4891 Unspecified atrial fibrillation: Secondary | ICD-10-CM | POA: Diagnosis not present

## 2011-09-01 NOTE — Discharge Summary (Signed)
  ABBREVIATED DISCHARGE SUMMARY      DATE OF HOSPITALIZATION:  27 August 2011  REASON FOR HOSPITALIZATION:  78 y.o. Wm with hx end stage left knee djd, pain.      SIGNIFICANT FINDINGS:  DJD  OPERATION:  Left total knee replacement  FINAL DIAGNOSIS:  same  SECONDARY DIAGNOSIS: none  CONSULTANTS:  none  DISCHARGE CONDITION:  STABLE  DISCHARGED TO:  HOME

## 2011-09-02 DIAGNOSIS — Z471 Aftercare following joint replacement surgery: Secondary | ICD-10-CM | POA: Diagnosis not present

## 2011-09-02 DIAGNOSIS — I10 Essential (primary) hypertension: Secondary | ICD-10-CM | POA: Diagnosis not present

## 2011-09-02 DIAGNOSIS — I2510 Atherosclerotic heart disease of native coronary artery without angina pectoris: Secondary | ICD-10-CM | POA: Diagnosis not present

## 2011-09-02 DIAGNOSIS — Z96659 Presence of unspecified artificial knee joint: Secondary | ICD-10-CM | POA: Diagnosis not present

## 2011-09-02 DIAGNOSIS — I4891 Unspecified atrial fibrillation: Secondary | ICD-10-CM | POA: Diagnosis not present

## 2011-09-04 DIAGNOSIS — Z471 Aftercare following joint replacement surgery: Secondary | ICD-10-CM | POA: Diagnosis not present

## 2011-09-04 DIAGNOSIS — Z96659 Presence of unspecified artificial knee joint: Secondary | ICD-10-CM | POA: Diagnosis not present

## 2011-09-04 DIAGNOSIS — I2510 Atherosclerotic heart disease of native coronary artery without angina pectoris: Secondary | ICD-10-CM | POA: Diagnosis not present

## 2011-09-04 DIAGNOSIS — I10 Essential (primary) hypertension: Secondary | ICD-10-CM | POA: Diagnosis not present

## 2011-09-04 DIAGNOSIS — I4891 Unspecified atrial fibrillation: Secondary | ICD-10-CM | POA: Diagnosis not present

## 2011-09-05 DIAGNOSIS — Z471 Aftercare following joint replacement surgery: Secondary | ICD-10-CM | POA: Diagnosis not present

## 2011-09-05 DIAGNOSIS — I10 Essential (primary) hypertension: Secondary | ICD-10-CM | POA: Diagnosis not present

## 2011-09-05 DIAGNOSIS — Z96659 Presence of unspecified artificial knee joint: Secondary | ICD-10-CM | POA: Diagnosis not present

## 2011-09-05 DIAGNOSIS — I2510 Atherosclerotic heart disease of native coronary artery without angina pectoris: Secondary | ICD-10-CM | POA: Diagnosis not present

## 2011-09-05 DIAGNOSIS — I4891 Unspecified atrial fibrillation: Secondary | ICD-10-CM | POA: Diagnosis not present

## 2011-09-08 ENCOUNTER — Ambulatory Visit (INDEPENDENT_AMBULATORY_CARE_PROVIDER_SITE_OTHER): Payer: Medicare Other | Admitting: Cardiology

## 2011-09-08 DIAGNOSIS — I10 Essential (primary) hypertension: Secondary | ICD-10-CM | POA: Diagnosis not present

## 2011-09-08 DIAGNOSIS — I4891 Unspecified atrial fibrillation: Secondary | ICD-10-CM | POA: Diagnosis not present

## 2011-09-08 DIAGNOSIS — I4892 Unspecified atrial flutter: Secondary | ICD-10-CM

## 2011-09-08 DIAGNOSIS — Z471 Aftercare following joint replacement surgery: Secondary | ICD-10-CM | POA: Diagnosis not present

## 2011-09-08 DIAGNOSIS — I2510 Atherosclerotic heart disease of native coronary artery without angina pectoris: Secondary | ICD-10-CM | POA: Diagnosis not present

## 2011-09-08 DIAGNOSIS — I6350 Cerebral infarction due to unspecified occlusion or stenosis of unspecified cerebral artery: Secondary | ICD-10-CM

## 2011-09-08 DIAGNOSIS — Z96659 Presence of unspecified artificial knee joint: Secondary | ICD-10-CM | POA: Diagnosis not present

## 2011-09-08 DIAGNOSIS — I639 Cerebral infarction, unspecified: Secondary | ICD-10-CM

## 2011-09-09 ENCOUNTER — Telehealth: Payer: Self-pay | Admitting: Family Medicine

## 2011-09-09 DIAGNOSIS — I4891 Unspecified atrial fibrillation: Secondary | ICD-10-CM | POA: Diagnosis not present

## 2011-09-09 DIAGNOSIS — I10 Essential (primary) hypertension: Secondary | ICD-10-CM | POA: Diagnosis not present

## 2011-09-09 DIAGNOSIS — I2510 Atherosclerotic heart disease of native coronary artery without angina pectoris: Secondary | ICD-10-CM | POA: Diagnosis not present

## 2011-09-09 DIAGNOSIS — Z471 Aftercare following joint replacement surgery: Secondary | ICD-10-CM | POA: Diagnosis not present

## 2011-09-09 DIAGNOSIS — Z96659 Presence of unspecified artificial knee joint: Secondary | ICD-10-CM | POA: Diagnosis not present

## 2011-09-09 NOTE — Telephone Encounter (Signed)
Refill request for Xanax 1 mg take 1 po qhs and pt last here on 07/15/11.

## 2011-09-10 DIAGNOSIS — I4891 Unspecified atrial fibrillation: Secondary | ICD-10-CM | POA: Diagnosis not present

## 2011-09-10 DIAGNOSIS — Z96659 Presence of unspecified artificial knee joint: Secondary | ICD-10-CM | POA: Diagnosis not present

## 2011-09-10 DIAGNOSIS — I10 Essential (primary) hypertension: Secondary | ICD-10-CM | POA: Diagnosis not present

## 2011-09-10 DIAGNOSIS — I2510 Atherosclerotic heart disease of native coronary artery without angina pectoris: Secondary | ICD-10-CM | POA: Diagnosis not present

## 2011-09-10 DIAGNOSIS — Z471 Aftercare following joint replacement surgery: Secondary | ICD-10-CM | POA: Diagnosis not present

## 2011-09-10 NOTE — Telephone Encounter (Signed)
Can refill 30 #  X 1  In Dr Carver Fila absence . Further refills per Dr Clent Ridges

## 2011-09-11 DIAGNOSIS — Z96659 Presence of unspecified artificial knee joint: Secondary | ICD-10-CM | POA: Diagnosis not present

## 2011-09-11 DIAGNOSIS — I10 Essential (primary) hypertension: Secondary | ICD-10-CM | POA: Diagnosis not present

## 2011-09-11 DIAGNOSIS — I2510 Atherosclerotic heart disease of native coronary artery without angina pectoris: Secondary | ICD-10-CM | POA: Diagnosis not present

## 2011-09-11 DIAGNOSIS — I4891 Unspecified atrial fibrillation: Secondary | ICD-10-CM | POA: Diagnosis not present

## 2011-09-11 DIAGNOSIS — Z471 Aftercare following joint replacement surgery: Secondary | ICD-10-CM | POA: Diagnosis not present

## 2011-09-11 MED ORDER — ALPRAZOLAM 1 MG PO TABS: 1.0000 mg | ORAL_TABLET | Freq: Every evening | ORAL | Status: DC | PRN

## 2011-09-11 NOTE — Telephone Encounter (Signed)
Script called in

## 2011-09-12 DIAGNOSIS — I2510 Atherosclerotic heart disease of native coronary artery without angina pectoris: Secondary | ICD-10-CM | POA: Diagnosis not present

## 2011-09-12 DIAGNOSIS — Z96659 Presence of unspecified artificial knee joint: Secondary | ICD-10-CM | POA: Diagnosis not present

## 2011-09-12 DIAGNOSIS — I4891 Unspecified atrial fibrillation: Secondary | ICD-10-CM | POA: Diagnosis not present

## 2011-09-12 DIAGNOSIS — Z471 Aftercare following joint replacement surgery: Secondary | ICD-10-CM | POA: Diagnosis not present

## 2011-09-12 DIAGNOSIS — I10 Essential (primary) hypertension: Secondary | ICD-10-CM | POA: Diagnosis not present

## 2011-09-15 ENCOUNTER — Ambulatory Visit: Payer: Self-pay | Admitting: Cardiovascular Disease

## 2011-09-15 DIAGNOSIS — I639 Cerebral infarction, unspecified: Secondary | ICD-10-CM

## 2011-09-15 DIAGNOSIS — I4891 Unspecified atrial fibrillation: Secondary | ICD-10-CM | POA: Diagnosis not present

## 2011-09-15 DIAGNOSIS — Z96659 Presence of unspecified artificial knee joint: Secondary | ICD-10-CM | POA: Diagnosis not present

## 2011-09-15 DIAGNOSIS — I2510 Atherosclerotic heart disease of native coronary artery without angina pectoris: Secondary | ICD-10-CM | POA: Diagnosis not present

## 2011-09-15 DIAGNOSIS — Z471 Aftercare following joint replacement surgery: Secondary | ICD-10-CM | POA: Diagnosis not present

## 2011-09-15 DIAGNOSIS — I4892 Unspecified atrial flutter: Secondary | ICD-10-CM

## 2011-09-15 DIAGNOSIS — I10 Essential (primary) hypertension: Secondary | ICD-10-CM | POA: Diagnosis not present

## 2011-09-15 LAB — POCT INR: INR: 2.7

## 2011-09-16 DIAGNOSIS — Z471 Aftercare following joint replacement surgery: Secondary | ICD-10-CM | POA: Diagnosis not present

## 2011-09-16 DIAGNOSIS — I2510 Atherosclerotic heart disease of native coronary artery without angina pectoris: Secondary | ICD-10-CM | POA: Diagnosis not present

## 2011-09-16 DIAGNOSIS — I4891 Unspecified atrial fibrillation: Secondary | ICD-10-CM | POA: Diagnosis not present

## 2011-09-16 DIAGNOSIS — Z96659 Presence of unspecified artificial knee joint: Secondary | ICD-10-CM | POA: Diagnosis not present

## 2011-09-16 DIAGNOSIS — I10 Essential (primary) hypertension: Secondary | ICD-10-CM | POA: Diagnosis not present

## 2011-09-17 DIAGNOSIS — Z96659 Presence of unspecified artificial knee joint: Secondary | ICD-10-CM | POA: Diagnosis not present

## 2011-09-17 DIAGNOSIS — M25619 Stiffness of unspecified shoulder, not elsewhere classified: Secondary | ICD-10-CM | POA: Diagnosis not present

## 2011-09-17 DIAGNOSIS — M6281 Muscle weakness (generalized): Secondary | ICD-10-CM | POA: Diagnosis not present

## 2011-09-19 DIAGNOSIS — M25619 Stiffness of unspecified shoulder, not elsewhere classified: Secondary | ICD-10-CM | POA: Diagnosis not present

## 2011-09-19 DIAGNOSIS — Z96659 Presence of unspecified artificial knee joint: Secondary | ICD-10-CM | POA: Diagnosis not present

## 2011-09-19 DIAGNOSIS — M6281 Muscle weakness (generalized): Secondary | ICD-10-CM | POA: Diagnosis not present

## 2011-09-22 DIAGNOSIS — M25619 Stiffness of unspecified shoulder, not elsewhere classified: Secondary | ICD-10-CM | POA: Diagnosis not present

## 2011-09-22 DIAGNOSIS — Z96659 Presence of unspecified artificial knee joint: Secondary | ICD-10-CM | POA: Diagnosis not present

## 2011-09-22 DIAGNOSIS — M6281 Muscle weakness (generalized): Secondary | ICD-10-CM | POA: Diagnosis not present

## 2011-09-24 DIAGNOSIS — M6281 Muscle weakness (generalized): Secondary | ICD-10-CM | POA: Diagnosis not present

## 2011-09-24 DIAGNOSIS — Z96659 Presence of unspecified artificial knee joint: Secondary | ICD-10-CM | POA: Diagnosis not present

## 2011-09-24 DIAGNOSIS — M25619 Stiffness of unspecified shoulder, not elsewhere classified: Secondary | ICD-10-CM | POA: Diagnosis not present

## 2011-09-29 DIAGNOSIS — M6281 Muscle weakness (generalized): Secondary | ICD-10-CM | POA: Diagnosis not present

## 2011-09-29 DIAGNOSIS — M25619 Stiffness of unspecified shoulder, not elsewhere classified: Secondary | ICD-10-CM | POA: Diagnosis not present

## 2011-09-29 DIAGNOSIS — Z96659 Presence of unspecified artificial knee joint: Secondary | ICD-10-CM | POA: Diagnosis not present

## 2011-09-30 ENCOUNTER — Ambulatory Visit (INDEPENDENT_AMBULATORY_CARE_PROVIDER_SITE_OTHER): Payer: Medicare Other | Admitting: *Deleted

## 2011-09-30 DIAGNOSIS — I6350 Cerebral infarction due to unspecified occlusion or stenosis of unspecified cerebral artery: Secondary | ICD-10-CM | POA: Diagnosis not present

## 2011-09-30 DIAGNOSIS — I4892 Unspecified atrial flutter: Secondary | ICD-10-CM | POA: Diagnosis not present

## 2011-09-30 DIAGNOSIS — I639 Cerebral infarction, unspecified: Secondary | ICD-10-CM

## 2011-10-01 DIAGNOSIS — M25619 Stiffness of unspecified shoulder, not elsewhere classified: Secondary | ICD-10-CM | POA: Diagnosis not present

## 2011-10-01 DIAGNOSIS — M6281 Muscle weakness (generalized): Secondary | ICD-10-CM | POA: Diagnosis not present

## 2011-10-01 DIAGNOSIS — Z96659 Presence of unspecified artificial knee joint: Secondary | ICD-10-CM | POA: Diagnosis not present

## 2011-10-02 ENCOUNTER — Telehealth: Payer: Self-pay | Admitting: Family Medicine

## 2011-10-02 NOTE — Telephone Encounter (Signed)
Call in #30 with 5 rf 

## 2011-10-02 NOTE — Telephone Encounter (Signed)
Refill request for Alprazolam 1 mg take 1 po qhs prn. 

## 2011-10-03 DIAGNOSIS — Z96659 Presence of unspecified artificial knee joint: Secondary | ICD-10-CM | POA: Diagnosis not present

## 2011-10-03 DIAGNOSIS — M25619 Stiffness of unspecified shoulder, not elsewhere classified: Secondary | ICD-10-CM | POA: Diagnosis not present

## 2011-10-03 DIAGNOSIS — M6281 Muscle weakness (generalized): Secondary | ICD-10-CM | POA: Diagnosis not present

## 2011-10-03 MED ORDER — ALPRAZOLAM 1 MG PO TABS: 1.0000 mg | ORAL_TABLET | Freq: Every evening | ORAL | Status: DC | PRN

## 2011-10-03 NOTE — Telephone Encounter (Signed)
I called in script 

## 2011-10-07 DIAGNOSIS — Z471 Aftercare following joint replacement surgery: Secondary | ICD-10-CM | POA: Diagnosis not present

## 2011-10-07 DIAGNOSIS — M1710 Unilateral primary osteoarthritis, unspecified knee: Secondary | ICD-10-CM | POA: Diagnosis not present

## 2011-10-10 ENCOUNTER — Other Ambulatory Visit: Payer: Self-pay | Admitting: Internal Medicine

## 2011-10-10 DIAGNOSIS — Z471 Aftercare following joint replacement surgery: Secondary | ICD-10-CM | POA: Diagnosis not present

## 2011-10-10 DIAGNOSIS — M1710 Unilateral primary osteoarthritis, unspecified knee: Secondary | ICD-10-CM | POA: Diagnosis not present

## 2011-10-10 NOTE — Telephone Encounter (Signed)
Refilled lisinopril

## 2011-10-13 ENCOUNTER — Other Ambulatory Visit: Payer: Self-pay

## 2011-10-13 DIAGNOSIS — M25619 Stiffness of unspecified shoulder, not elsewhere classified: Secondary | ICD-10-CM | POA: Diagnosis not present

## 2011-10-13 DIAGNOSIS — M6281 Muscle weakness (generalized): Secondary | ICD-10-CM | POA: Diagnosis not present

## 2011-10-13 DIAGNOSIS — M1710 Unilateral primary osteoarthritis, unspecified knee: Secondary | ICD-10-CM | POA: Diagnosis not present

## 2011-10-13 DIAGNOSIS — Z96659 Presence of unspecified artificial knee joint: Secondary | ICD-10-CM | POA: Diagnosis not present

## 2011-10-13 MED ORDER — WARFARIN SODIUM 5 MG PO TABS: ORAL_TABLET | ORAL | Status: DC

## 2011-10-15 DIAGNOSIS — Z96659 Presence of unspecified artificial knee joint: Secondary | ICD-10-CM | POA: Diagnosis not present

## 2011-10-15 DIAGNOSIS — M1710 Unilateral primary osteoarthritis, unspecified knee: Secondary | ICD-10-CM | POA: Diagnosis not present

## 2011-10-15 DIAGNOSIS — M25619 Stiffness of unspecified shoulder, not elsewhere classified: Secondary | ICD-10-CM | POA: Diagnosis not present

## 2011-10-15 DIAGNOSIS — M6281 Muscle weakness (generalized): Secondary | ICD-10-CM | POA: Diagnosis not present

## 2011-10-20 DIAGNOSIS — M6281 Muscle weakness (generalized): Secondary | ICD-10-CM | POA: Diagnosis not present

## 2011-10-20 DIAGNOSIS — M1710 Unilateral primary osteoarthritis, unspecified knee: Secondary | ICD-10-CM | POA: Diagnosis not present

## 2011-10-20 DIAGNOSIS — M25619 Stiffness of unspecified shoulder, not elsewhere classified: Secondary | ICD-10-CM | POA: Diagnosis not present

## 2011-10-20 DIAGNOSIS — Z96659 Presence of unspecified artificial knee joint: Secondary | ICD-10-CM | POA: Diagnosis not present

## 2011-10-22 DIAGNOSIS — Z96659 Presence of unspecified artificial knee joint: Secondary | ICD-10-CM | POA: Diagnosis not present

## 2011-10-22 DIAGNOSIS — M25619 Stiffness of unspecified shoulder, not elsewhere classified: Secondary | ICD-10-CM | POA: Diagnosis not present

## 2011-10-22 DIAGNOSIS — M1710 Unilateral primary osteoarthritis, unspecified knee: Secondary | ICD-10-CM | POA: Diagnosis not present

## 2011-10-22 DIAGNOSIS — M6281 Muscle weakness (generalized): Secondary | ICD-10-CM | POA: Diagnosis not present

## 2011-10-28 ENCOUNTER — Ambulatory Visit (INDEPENDENT_AMBULATORY_CARE_PROVIDER_SITE_OTHER): Payer: Medicare Other | Admitting: *Deleted

## 2011-10-28 DIAGNOSIS — I4892 Unspecified atrial flutter: Secondary | ICD-10-CM | POA: Diagnosis not present

## 2011-10-28 DIAGNOSIS — I6350 Cerebral infarction due to unspecified occlusion or stenosis of unspecified cerebral artery: Secondary | ICD-10-CM | POA: Diagnosis not present

## 2011-10-28 DIAGNOSIS — I639 Cerebral infarction, unspecified: Secondary | ICD-10-CM

## 2011-10-29 DIAGNOSIS — M6281 Muscle weakness (generalized): Secondary | ICD-10-CM | POA: Diagnosis not present

## 2011-10-29 DIAGNOSIS — M1710 Unilateral primary osteoarthritis, unspecified knee: Secondary | ICD-10-CM | POA: Diagnosis not present

## 2011-10-29 DIAGNOSIS — Z96659 Presence of unspecified artificial knee joint: Secondary | ICD-10-CM | POA: Diagnosis not present

## 2011-10-31 DIAGNOSIS — M6281 Muscle weakness (generalized): Secondary | ICD-10-CM | POA: Diagnosis not present

## 2011-10-31 DIAGNOSIS — M1710 Unilateral primary osteoarthritis, unspecified knee: Secondary | ICD-10-CM | POA: Diagnosis not present

## 2011-10-31 DIAGNOSIS — Z96659 Presence of unspecified artificial knee joint: Secondary | ICD-10-CM | POA: Diagnosis not present

## 2011-10-31 DIAGNOSIS — M25619 Stiffness of unspecified shoulder, not elsewhere classified: Secondary | ICD-10-CM | POA: Diagnosis not present

## 2011-11-05 DIAGNOSIS — M6281 Muscle weakness (generalized): Secondary | ICD-10-CM | POA: Diagnosis not present

## 2011-11-05 DIAGNOSIS — M1710 Unilateral primary osteoarthritis, unspecified knee: Secondary | ICD-10-CM | POA: Diagnosis not present

## 2011-11-05 DIAGNOSIS — M25619 Stiffness of unspecified shoulder, not elsewhere classified: Secondary | ICD-10-CM | POA: Diagnosis not present

## 2011-11-07 DIAGNOSIS — M25619 Stiffness of unspecified shoulder, not elsewhere classified: Secondary | ICD-10-CM | POA: Diagnosis not present

## 2011-11-07 DIAGNOSIS — Z96659 Presence of unspecified artificial knee joint: Secondary | ICD-10-CM | POA: Diagnosis not present

## 2011-11-07 DIAGNOSIS — M1710 Unilateral primary osteoarthritis, unspecified knee: Secondary | ICD-10-CM | POA: Diagnosis not present

## 2011-11-07 DIAGNOSIS — M6281 Muscle weakness (generalized): Secondary | ICD-10-CM | POA: Diagnosis not present

## 2011-11-10 ENCOUNTER — Ambulatory Visit (INDEPENDENT_AMBULATORY_CARE_PROVIDER_SITE_OTHER): Payer: Medicare Other | Admitting: *Deleted

## 2011-11-10 DIAGNOSIS — I4892 Unspecified atrial flutter: Secondary | ICD-10-CM

## 2011-11-10 DIAGNOSIS — I639 Cerebral infarction, unspecified: Secondary | ICD-10-CM

## 2011-11-10 DIAGNOSIS — M6281 Muscle weakness (generalized): Secondary | ICD-10-CM | POA: Diagnosis not present

## 2011-11-10 DIAGNOSIS — M25619 Stiffness of unspecified shoulder, not elsewhere classified: Secondary | ICD-10-CM | POA: Diagnosis not present

## 2011-11-10 DIAGNOSIS — I6350 Cerebral infarction due to unspecified occlusion or stenosis of unspecified cerebral artery: Secondary | ICD-10-CM

## 2011-11-10 DIAGNOSIS — Z96659 Presence of unspecified artificial knee joint: Secondary | ICD-10-CM | POA: Diagnosis not present

## 2011-11-10 DIAGNOSIS — M1710 Unilateral primary osteoarthritis, unspecified knee: Secondary | ICD-10-CM | POA: Diagnosis not present

## 2011-11-10 LAB — POCT INR: INR: 4

## 2011-11-12 DIAGNOSIS — M25619 Stiffness of unspecified shoulder, not elsewhere classified: Secondary | ICD-10-CM | POA: Diagnosis not present

## 2011-11-12 DIAGNOSIS — M6281 Muscle weakness (generalized): Secondary | ICD-10-CM | POA: Diagnosis not present

## 2011-11-12 DIAGNOSIS — Z96659 Presence of unspecified artificial knee joint: Secondary | ICD-10-CM | POA: Diagnosis not present

## 2011-11-12 DIAGNOSIS — M1710 Unilateral primary osteoarthritis, unspecified knee: Secondary | ICD-10-CM | POA: Diagnosis not present

## 2011-11-17 DIAGNOSIS — M6281 Muscle weakness (generalized): Secondary | ICD-10-CM | POA: Diagnosis not present

## 2011-11-17 DIAGNOSIS — Z96659 Presence of unspecified artificial knee joint: Secondary | ICD-10-CM | POA: Diagnosis not present

## 2011-11-17 DIAGNOSIS — M1710 Unilateral primary osteoarthritis, unspecified knee: Secondary | ICD-10-CM | POA: Diagnosis not present

## 2011-11-17 DIAGNOSIS — M25619 Stiffness of unspecified shoulder, not elsewhere classified: Secondary | ICD-10-CM | POA: Diagnosis not present

## 2011-11-18 DIAGNOSIS — Z471 Aftercare following joint replacement surgery: Secondary | ICD-10-CM | POA: Diagnosis not present

## 2011-11-18 DIAGNOSIS — C61 Malignant neoplasm of prostate: Secondary | ICD-10-CM | POA: Diagnosis not present

## 2011-11-18 DIAGNOSIS — M1710 Unilateral primary osteoarthritis, unspecified knee: Secondary | ICD-10-CM | POA: Diagnosis not present

## 2011-11-25 DIAGNOSIS — C61 Malignant neoplasm of prostate: Secondary | ICD-10-CM | POA: Diagnosis not present

## 2011-11-25 DIAGNOSIS — N393 Stress incontinence (female) (male): Secondary | ICD-10-CM | POA: Diagnosis not present

## 2011-12-02 ENCOUNTER — Ambulatory Visit (INDEPENDENT_AMBULATORY_CARE_PROVIDER_SITE_OTHER): Payer: Medicare Other | Admitting: *Deleted

## 2011-12-02 DIAGNOSIS — I639 Cerebral infarction, unspecified: Secondary | ICD-10-CM

## 2011-12-02 DIAGNOSIS — I6350 Cerebral infarction due to unspecified occlusion or stenosis of unspecified cerebral artery: Secondary | ICD-10-CM | POA: Diagnosis not present

## 2011-12-02 DIAGNOSIS — I4892 Unspecified atrial flutter: Secondary | ICD-10-CM

## 2011-12-02 LAB — POCT INR: INR: 3.5

## 2012-01-12 ENCOUNTER — Telehealth: Payer: Self-pay | Admitting: Internal Medicine

## 2012-01-12 NOTE — Telephone Encounter (Signed)
I spoke with the patient. He states that he had knee surgery on 08/27/11 and has lost 50 lbs since then. He is wanting some instruction on how much cardio exercise he should be doing. He lifts weights (about 3 sets of 15) and rides a bike. He tries to keep his target HR about 110-115 bpm. He works out at the [employer] I asked if he works with a Systems analyst and he states "they do not know what they are doing." He thinks the focus is more on muscle building with the trainers. He would like to know what his target HR should be and how long he should keep it there for his age. He would also like to know how much cardio he should be doing either by Dr. Odessa Fleming recommendation or who would he recommend advise him of this. He has an appointment to follow up with Dr. Graciela Husbands on 8/29. I advised I would forward to Dr. Graciela Husbands for recommendations.

## 2012-01-12 NOTE — Telephone Encounter (Signed)
Pt calling re losing weight, where can he get eval for a man his age to determine what his strength level should be, if he is exercising enough or too much? pls advise

## 2012-01-13 ENCOUNTER — Ambulatory Visit (INDEPENDENT_AMBULATORY_CARE_PROVIDER_SITE_OTHER): Payer: Medicare Other

## 2012-01-13 DIAGNOSIS — I6350 Cerebral infarction due to unspecified occlusion or stenosis of unspecified cerebral artery: Secondary | ICD-10-CM | POA: Diagnosis not present

## 2012-01-13 DIAGNOSIS — I639 Cerebral infarction, unspecified: Secondary | ICD-10-CM

## 2012-01-13 DIAGNOSIS — I4892 Unspecified atrial flutter: Secondary | ICD-10-CM

## 2012-01-13 LAB — POCT INR: INR: 4.5

## 2012-01-27 NOTE — Telephone Encounter (Signed)
Routing- this was previously on MD desktop for review and recommendations.

## 2012-02-05 ENCOUNTER — Ambulatory Visit (INDEPENDENT_AMBULATORY_CARE_PROVIDER_SITE_OTHER): Payer: Medicare Other

## 2012-02-05 ENCOUNTER — Ambulatory Visit (INDEPENDENT_AMBULATORY_CARE_PROVIDER_SITE_OTHER): Payer: Medicare Other | Admitting: Internal Medicine

## 2012-02-05 ENCOUNTER — Encounter: Payer: Self-pay | Admitting: Internal Medicine

## 2012-02-05 VITALS — BP 111/95 | HR 70 | Ht 67.0 in | Wt 181.8 lb

## 2012-02-05 DIAGNOSIS — I10 Essential (primary) hypertension: Secondary | ICD-10-CM | POA: Diagnosis not present

## 2012-02-05 DIAGNOSIS — I2510 Atherosclerotic heart disease of native coronary artery without angina pectoris: Secondary | ICD-10-CM

## 2012-02-05 DIAGNOSIS — I4892 Unspecified atrial flutter: Secondary | ICD-10-CM | POA: Diagnosis not present

## 2012-02-05 DIAGNOSIS — I639 Cerebral infarction, unspecified: Secondary | ICD-10-CM

## 2012-02-05 DIAGNOSIS — I6350 Cerebral infarction due to unspecified occlusion or stenosis of unspecified cerebral artery: Secondary | ICD-10-CM | POA: Diagnosis not present

## 2012-02-05 DIAGNOSIS — E785 Hyperlipidemia, unspecified: Secondary | ICD-10-CM

## 2012-02-05 LAB — LIPID PANEL
Cholesterol: 108 mg/dL (ref 0–200)
LDL Cholesterol: 43 mg/dL (ref 0–99)
Total CHOL/HDL Ratio: 2

## 2012-02-05 NOTE — Patient Instructions (Signed)
Your physician wants you to follow-up in: 1 year with Dr. Klein. You will receive a reminder letter in the mail two months in advance. If you don't receive a letter, please call our office to schedule the follow-up appointment.  Your physician recommends that you continue on your current medications as directed. Please refer to the Current Medication list given to you today.  

## 2012-02-05 NOTE — Progress Notes (Signed)
HPI  [name] is a 78 y.o. male seen in followup of atrial flutter and a recent stroke manifesting as central retinal artery occlusion. 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 LVEF by echo 2010  Normal  He is s/p knee surgery in march with subsequent 50lb weight loss  He is doing well  The patient denies SOB, chest pain edema or palpitations.  There has been no syncope or presyncope.      Past Medical History  Diagnosis Date  . Chickenpox   . Blood transfusion abn reaction or complication, no procedure mishap   . History of colonic polyps     adenomatous  . Coronary artery disease     s/p CABG and Mitral valve repair  . Normal cardiac stress test   . Hyperlipidemia   . Hypertension   . Urinary incontinence   . Onychomycosis   . Central retinal artery occlusion   . CVA (cerebral infarction)   . Atrial fibrillation // Atrial flutter   . History of blood clots   . GI bleed     after colonoscopy/polyp removal  . Anemia, posthemorrhagic, acute   . Prostate cancer   . Blood transfusion     Chills, shaking  . 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 Ulcer   . Insomnia   . 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 Arthritis     Hands    Past Surgical History  Procedure Date  . Mitral valve replacement 2007  . Radical prostatectomy with radiation 2003  . A flutter ablation 2010  . Bladder pump installed 2011    Dr. McDiarmid  . Colonoscopy w/ biopsies and polypectomy 12/17/2010, 12/24/2010    adenomatous polyps and one focal carcinoma in situ, external hemorrhoids, repeat colonoscopy to control post-polypectomy bleed  . Inguinal hernia repair 1993    bilatera  . Colonoscopy 06/23/2011    Procedure: COLONOSCOPY;  Surgeon: Iva Boop, MD;   Location: [facility];  Service: Endoscopy;  Laterality: N/A;  . Mitral valve ring placement     Per pt he did not have a mitral valve replacement  . Coronary artery bypass graft 1989    4 vessels  . Coronary artery bypass graft 2007    and mitral valve replacement   . Eye surgery     Catarat with Lens Bil  . Cancer of right thumb nail   . Total knee arthroplasty 08/27/2011    Procedure: TOTAL KNEE ARTHROPLASTY;  Surgeon: Loreta Ave, MD;  Location: [facility] OR;  Service: Orthopedics;  Laterality: Left;  90 MINUTES FOR SURGERY    Current Outpatient Prescriptions  Medication Sig Dispense Refill  . ALPRAZolam (XANAX) 1 MG tablet Take 1 tablet (1 mg total) by mouth at bedtime as needed for sleep.  30 tablet  5  . atorvastatin (LIPITOR) 40 MG tablet Take 1 tablet (40 mg total) by mouth daily.  90 tablet  3  . lisinopril (PRINIVIL,ZESTRIL) 20 MG tablet Take 20 mg by mouth daily.       . metoprolol tartrate (LOPRESSOR) 25 MG tablet Take 12.5 mg by mouth daily.      Marland Kitchen warfarin (COUMADIN) 5 MG tablet Take as directed by anticoagulation clinic  50 tablet  3  . enoxaparin (LOVENOX) 150 MG/ML injection Inject 1 mL (150 mg total) into the skin daily.  10 mL  0  . enoxaparin (LOVENOX) 150 MG/ML injection Inject 1 mL (150 mg total) into the skin daily.  8 mL  0  . DISCONTD: lisinopril (PRINIVIL,ZESTRIL) 20 MG tablet TAKE 1 TABLET BY MOUTH DAILY.  30 tablet  11    Allergies  Allergen Reactions  . Niacin     contraindication    Review of Systems negative except from HPI and PMH  Physical Exam BP 111/95  Pulse 70  Ht 5\' 7"  (1.702 m)  Wt 181 lb 12.8 oz (82.464 kg)  BMI 28.47 kg/m2 Well developed and well nourished in no acute distress HENT normal E scleral and icterus clear Neck Supple JVP flat; carotids brisk and full Clear to ausculation Irregular rate and rhythm, no murmurs gallops or rub Soft with active bowel sounds No clubbing cyanosis none Edema Alert and oriented, grossly normal  motor and sensory function Skin Warm and Dry  Electrocardiogram today demonstrates atrial fibrillation at 59 Intervals-/10/37 Axis is -10 Otherwise normal apart from the full list  Assessment and  Plan

## 2012-02-05 NOTE — Assessment & Plan Note (Signed)
Need to check repeat lipids

## 2012-02-05 NOTE — Assessment & Plan Note (Signed)
Stable on durrent meds

## 2012-02-05 NOTE — Telephone Encounter (Signed)
The patient was seen by Dr. Graciela Husbands today.

## 2012-02-05 NOTE — Assessment & Plan Note (Signed)
Well controlled 

## 2012-02-05 NOTE — Assessment & Plan Note (Addendum)
His atrial fibrillation permanently and on warfarin which he is tolerating. He has a prior stroke. He is asymptomatic. We reviewed his exercise routine; I've encouraged him to continue.

## 2012-03-04 ENCOUNTER — Ambulatory Visit (INDEPENDENT_AMBULATORY_CARE_PROVIDER_SITE_OTHER): Payer: Medicare Other | Admitting: *Deleted

## 2012-03-04 DIAGNOSIS — I4892 Unspecified atrial flutter: Secondary | ICD-10-CM | POA: Diagnosis not present

## 2012-03-04 DIAGNOSIS — I6350 Cerebral infarction due to unspecified occlusion or stenosis of unspecified cerebral artery: Secondary | ICD-10-CM

## 2012-03-04 DIAGNOSIS — I639 Cerebral infarction, unspecified: Secondary | ICD-10-CM

## 2012-03-05 ENCOUNTER — Encounter: Payer: Self-pay | Admitting: *Deleted

## 2012-03-12 ENCOUNTER — Encounter: Payer: Self-pay | Admitting: Cardiology

## 2012-03-17 ENCOUNTER — Telehealth: Payer: Self-pay | Admitting: Internal Medicine

## 2012-03-17 MED ORDER — WARFARIN SODIUM 5 MG PO TABS: ORAL_TABLET | ORAL | Status: DC

## 2012-03-17 NOTE — Telephone Encounter (Signed)
New Problem:    Patient called in needing a 90 day refill of his warfarin (COUMADIN) 5 MG tablet.  Patient has one day left.  Please call once the order has been placed.

## 2012-03-17 NOTE — Telephone Encounter (Signed)
Advised pt 90 day supply of Warfarin 5mg  has been sent to [facility] as requested.

## 2012-03-19 ENCOUNTER — Other Ambulatory Visit: Payer: Self-pay | Admitting: *Deleted

## 2012-03-19 MED ORDER — WARFARIN SODIUM 5 MG PO TABS: ORAL_TABLET | ORAL | Status: DC

## 2012-04-01 ENCOUNTER — Ambulatory Visit (INDEPENDENT_AMBULATORY_CARE_PROVIDER_SITE_OTHER): Payer: Medicare Other | Admitting: *Deleted

## 2012-04-01 DIAGNOSIS — I6350 Cerebral infarction due to unspecified occlusion or stenosis of unspecified cerebral artery: Secondary | ICD-10-CM

## 2012-04-01 DIAGNOSIS — I639 Cerebral infarction, unspecified: Secondary | ICD-10-CM

## 2012-04-01 DIAGNOSIS — I4892 Unspecified atrial flutter: Secondary | ICD-10-CM

## 2012-04-01 LAB — POCT INR: INR: 3

## 2012-04-06 ENCOUNTER — Telehealth: Payer: Self-pay | Admitting: Family Medicine

## 2012-04-06 NOTE — Telephone Encounter (Signed)
Refill request Alprazolam 1 mg take 1 po qhs prn

## 2012-04-07 MED ORDER — ALPRAZOLAM 1 MG PO TABS: 1.0000 mg | ORAL_TABLET | Freq: Every evening | ORAL | Status: DC | PRN

## 2012-04-07 NOTE — Telephone Encounter (Signed)
Call in #30 with 5 rf 

## 2012-04-07 NOTE — Telephone Encounter (Signed)
Called in Rx

## 2012-04-16 DIAGNOSIS — M25569 Pain in unspecified knee: Secondary | ICD-10-CM | POA: Diagnosis not present

## 2012-04-29 ENCOUNTER — Ambulatory Visit (INDEPENDENT_AMBULATORY_CARE_PROVIDER_SITE_OTHER): Payer: Medicare Other

## 2012-04-29 DIAGNOSIS — I4892 Unspecified atrial flutter: Secondary | ICD-10-CM

## 2012-04-29 DIAGNOSIS — I6350 Cerebral infarction due to unspecified occlusion or stenosis of unspecified cerebral artery: Secondary | ICD-10-CM

## 2012-04-29 DIAGNOSIS — I639 Cerebral infarction, unspecified: Secondary | ICD-10-CM

## 2012-04-29 LAB — POCT INR: INR: 3.5

## 2012-05-01 DIAGNOSIS — Z23 Encounter for immunization: Secondary | ICD-10-CM | POA: Diagnosis not present

## 2012-05-27 ENCOUNTER — Ambulatory Visit (INDEPENDENT_AMBULATORY_CARE_PROVIDER_SITE_OTHER): Payer: Medicare Other | Admitting: *Deleted

## 2012-05-27 DIAGNOSIS — I6350 Cerebral infarction due to unspecified occlusion or stenosis of unspecified cerebral artery: Secondary | ICD-10-CM

## 2012-05-27 DIAGNOSIS — I4892 Unspecified atrial flutter: Secondary | ICD-10-CM | POA: Diagnosis not present

## 2012-05-27 DIAGNOSIS — I639 Cerebral infarction, unspecified: Secondary | ICD-10-CM

## 2012-05-27 LAB — POCT INR: INR: 3.8

## 2012-05-31 ENCOUNTER — Other Ambulatory Visit: Payer: Self-pay

## 2012-05-31 DIAGNOSIS — E782 Mixed hyperlipidemia: Secondary | ICD-10-CM

## 2012-05-31 MED ORDER — ATORVASTATIN CALCIUM 40 MG PO TABS: 40.0000 mg | ORAL_TABLET | Freq: Every day | ORAL | Status: DC

## 2012-06-18 DIAGNOSIS — R310 Gross hematuria: Secondary | ICD-10-CM | POA: Diagnosis not present

## 2012-06-25 ENCOUNTER — Ambulatory Visit (INDEPENDENT_AMBULATORY_CARE_PROVIDER_SITE_OTHER): Payer: Medicare Other | Admitting: *Deleted

## 2012-06-25 DIAGNOSIS — I4892 Unspecified atrial flutter: Secondary | ICD-10-CM

## 2012-06-25 DIAGNOSIS — I639 Cerebral infarction, unspecified: Secondary | ICD-10-CM

## 2012-06-25 DIAGNOSIS — I6350 Cerebral infarction due to unspecified occlusion or stenosis of unspecified cerebral artery: Secondary | ICD-10-CM

## 2012-06-28 ENCOUNTER — Other Ambulatory Visit: Payer: Self-pay

## 2012-06-28 MED ORDER — METOPROLOL TARTRATE 25 MG PO TABS: 12.5000 mg | ORAL_TABLET | Freq: Every day | ORAL | Status: DC

## 2012-06-30 DIAGNOSIS — Z8546 Personal history of malignant neoplasm of prostate: Secondary | ICD-10-CM | POA: Diagnosis not present

## 2012-06-30 DIAGNOSIS — N393 Stress incontinence (female) (male): Secondary | ICD-10-CM | POA: Diagnosis not present

## 2012-06-30 DIAGNOSIS — R310 Gross hematuria: Secondary | ICD-10-CM | POA: Diagnosis not present

## 2012-06-30 DIAGNOSIS — N281 Cyst of kidney, acquired: Secondary | ICD-10-CM | POA: Diagnosis not present

## 2012-07-02 ENCOUNTER — Other Ambulatory Visit (HOSPITAL_COMMUNITY): Payer: Self-pay | Admitting: Urology

## 2012-07-02 DIAGNOSIS — N281 Cyst of kidney, acquired: Secondary | ICD-10-CM

## 2012-07-02 DIAGNOSIS — N289 Disorder of kidney and ureter, unspecified: Secondary | ICD-10-CM

## 2012-07-06 ENCOUNTER — Ambulatory Visit (HOSPITAL_COMMUNITY)
Admission: RE | Admit: 2012-07-06 | Discharge: 2012-07-06 | Disposition: A | Discharge status: Home / Self Care | Payer: Medicare Other | Source: Ambulatory Visit | Attending: Urology | Admitting: Urology

## 2012-07-06 DIAGNOSIS — C61 Malignant neoplasm of prostate: Secondary | ICD-10-CM | POA: Insufficient documentation

## 2012-07-06 DIAGNOSIS — M4120 Other idiopathic scoliosis, site unspecified: Secondary | ICD-10-CM | POA: Diagnosis not present

## 2012-07-06 DIAGNOSIS — K7689 Other specified diseases of liver: Secondary | ICD-10-CM | POA: Insufficient documentation

## 2012-07-06 DIAGNOSIS — N281 Cyst of kidney, acquired: Secondary | ICD-10-CM | POA: Diagnosis not present

## 2012-07-06 DIAGNOSIS — M47817 Spondylosis without myelopathy or radiculopathy, lumbosacral region: Secondary | ICD-10-CM | POA: Diagnosis not present

## 2012-07-06 DIAGNOSIS — N289 Disorder of kidney and ureter, unspecified: Secondary | ICD-10-CM

## 2012-07-06 DIAGNOSIS — J9819 Other pulmonary collapse: Secondary | ICD-10-CM | POA: Insufficient documentation

## 2012-07-06 MED ORDER — GADOBENATE DIMEGLUMINE 529 MG/ML IV SOLN
20.0000 mL | Freq: Once | INTRAVENOUS | Status: AC | PRN
  Administered 2012-07-06: 17 mL via INTRAVENOUS

## 2012-07-30 ENCOUNTER — Ambulatory Visit (INDEPENDENT_AMBULATORY_CARE_PROVIDER_SITE_OTHER): Payer: Medicare Other | Admitting: *Deleted

## 2012-07-30 DIAGNOSIS — I639 Cerebral infarction, unspecified: Secondary | ICD-10-CM

## 2012-07-30 DIAGNOSIS — I6350 Cerebral infarction due to unspecified occlusion or stenosis of unspecified cerebral artery: Secondary | ICD-10-CM

## 2012-07-30 DIAGNOSIS — I4892 Unspecified atrial flutter: Secondary | ICD-10-CM | POA: Diagnosis not present

## 2012-07-30 LAB — POCT INR: INR: 4.1

## 2012-08-18 ENCOUNTER — Encounter (INDEPENDENT_AMBULATORY_CARE_PROVIDER_SITE_OTHER): Payer: Medicare Other

## 2012-08-18 DIAGNOSIS — I6529 Occlusion and stenosis of unspecified carotid artery: Secondary | ICD-10-CM | POA: Diagnosis not present

## 2012-08-26 ENCOUNTER — Telehealth: Payer: Self-pay | Admitting: Internal Medicine

## 2012-08-26 NOTE — Telephone Encounter (Signed)
New problem   Pt stated he had a message left on his vm to return Deborah's call. But no last name was left.

## 2012-08-26 NOTE — Telephone Encounter (Signed)
Reviewed carotid results with pt who states understanding.  He will repeat in one year as ordered.

## 2012-08-27 ENCOUNTER — Ambulatory Visit (INDEPENDENT_AMBULATORY_CARE_PROVIDER_SITE_OTHER): Payer: Medicare Other | Admitting: Pharmacist

## 2012-08-27 DIAGNOSIS — I6350 Cerebral infarction due to unspecified occlusion or stenosis of unspecified cerebral artery: Secondary | ICD-10-CM

## 2012-08-27 DIAGNOSIS — I639 Cerebral infarction, unspecified: Secondary | ICD-10-CM

## 2012-08-27 DIAGNOSIS — I4892 Unspecified atrial flutter: Secondary | ICD-10-CM

## 2012-08-27 LAB — POCT INR: INR: 3.6

## 2012-09-01 DIAGNOSIS — G5600 Carpal tunnel syndrome, unspecified upper limb: Secondary | ICD-10-CM | POA: Diagnosis not present

## 2012-09-01 DIAGNOSIS — M5030 Other cervical disc degeneration, unspecified cervical region: Secondary | ICD-10-CM | POA: Diagnosis not present

## 2012-09-28 ENCOUNTER — Ambulatory Visit (INDEPENDENT_AMBULATORY_CARE_PROVIDER_SITE_OTHER): Payer: Medicare Other | Admitting: *Deleted

## 2012-09-28 DIAGNOSIS — I6350 Cerebral infarction due to unspecified occlusion or stenosis of unspecified cerebral artery: Secondary | ICD-10-CM

## 2012-09-28 DIAGNOSIS — I4892 Unspecified atrial flutter: Secondary | ICD-10-CM | POA: Diagnosis not present

## 2012-09-28 DIAGNOSIS — I639 Cerebral infarction, unspecified: Secondary | ICD-10-CM

## 2012-09-28 LAB — PROTIME-INR: INR: 6.6 ratio (ref 0.8–1.0)

## 2012-10-01 ENCOUNTER — Ambulatory Visit (INDEPENDENT_AMBULATORY_CARE_PROVIDER_SITE_OTHER): Payer: Medicare Other | Admitting: *Deleted

## 2012-10-01 DIAGNOSIS — I6350 Cerebral infarction due to unspecified occlusion or stenosis of unspecified cerebral artery: Secondary | ICD-10-CM | POA: Diagnosis not present

## 2012-10-01 DIAGNOSIS — I4892 Unspecified atrial flutter: Secondary | ICD-10-CM

## 2012-10-01 DIAGNOSIS — I639 Cerebral infarction, unspecified: Secondary | ICD-10-CM

## 2012-10-01 LAB — POCT INR: INR: 1.8

## 2012-10-04 ENCOUNTER — Other Ambulatory Visit: Payer: Self-pay | Admitting: *Deleted

## 2012-10-04 ENCOUNTER — Telehealth: Payer: Self-pay | Admitting: Family Medicine

## 2012-10-04 MED ORDER — WARFARIN SODIUM 5 MG PO TABS: ORAL_TABLET | ORAL | Status: DC

## 2012-10-04 NOTE — Telephone Encounter (Signed)
Refill request for Alprazolam 1 mg take 1 po qhs prn. 

## 2012-10-05 NOTE — Telephone Encounter (Signed)
Call in #30 only. He needs an OV soon  

## 2012-10-05 NOTE — Telephone Encounter (Signed)
Pharmacy informed and they will tell pt he needs ov

## 2012-10-15 ENCOUNTER — Ambulatory Visit (INDEPENDENT_AMBULATORY_CARE_PROVIDER_SITE_OTHER): Payer: Medicare Other | Admitting: *Deleted

## 2012-10-15 DIAGNOSIS — I4892 Unspecified atrial flutter: Secondary | ICD-10-CM | POA: Diagnosis not present

## 2012-10-15 DIAGNOSIS — I639 Cerebral infarction, unspecified: Secondary | ICD-10-CM

## 2012-10-15 DIAGNOSIS — I6350 Cerebral infarction due to unspecified occlusion or stenosis of unspecified cerebral artery: Secondary | ICD-10-CM

## 2012-11-02 ENCOUNTER — Telehealth: Payer: Self-pay | Admitting: Family Medicine

## 2012-11-02 ENCOUNTER — Other Ambulatory Visit: Payer: Self-pay | Admitting: Internal Medicine

## 2012-11-02 NOTE — Telephone Encounter (Signed)
Call in #30 with 5 rf 

## 2012-11-02 NOTE — Telephone Encounter (Signed)
Refill request for Alprazolam 1 mg take 1 po qhs prn.

## 2012-11-03 MED ORDER — ALPRAZOLAM 1 MG PO TABS: 1.0000 mg | ORAL_TABLET | Freq: Every evening | ORAL | Status: DC | PRN

## 2012-11-03 NOTE — Telephone Encounter (Signed)
I called in script 

## 2012-11-11 DIAGNOSIS — M25569 Pain in unspecified knee: Secondary | ICD-10-CM | POA: Diagnosis not present

## 2012-11-12 DIAGNOSIS — M25569 Pain in unspecified knee: Secondary | ICD-10-CM | POA: Diagnosis not present

## 2012-11-12 DIAGNOSIS — M25469 Effusion, unspecified knee: Secondary | ICD-10-CM | POA: Diagnosis not present

## 2012-11-12 DIAGNOSIS — M5137 Other intervertebral disc degeneration, lumbosacral region: Secondary | ICD-10-CM | POA: Diagnosis not present

## 2012-11-19 ENCOUNTER — Ambulatory Visit (INDEPENDENT_AMBULATORY_CARE_PROVIDER_SITE_OTHER): Payer: Medicare Other | Admitting: *Deleted

## 2012-11-19 DIAGNOSIS — I4892 Unspecified atrial flutter: Secondary | ICD-10-CM | POA: Diagnosis not present

## 2012-11-19 DIAGNOSIS — I6350 Cerebral infarction due to unspecified occlusion or stenosis of unspecified cerebral artery: Secondary | ICD-10-CM

## 2012-11-19 DIAGNOSIS — I639 Cerebral infarction, unspecified: Secondary | ICD-10-CM

## 2012-11-19 LAB — POCT INR: INR: 3.6

## 2012-11-29 ENCOUNTER — Encounter: Payer: Self-pay | Admitting: Family Medicine

## 2012-11-29 ENCOUNTER — Ambulatory Visit (INDEPENDENT_AMBULATORY_CARE_PROVIDER_SITE_OTHER): Payer: Medicare Other | Admitting: Family Medicine

## 2012-11-29 VITALS — BP 136/68 | HR 95 | Temp 98.2°F | Wt 182.0 lb

## 2012-11-29 DIAGNOSIS — R05 Cough: Secondary | ICD-10-CM | POA: Diagnosis not present

## 2012-11-29 MED ORDER — LOSARTAN POTASSIUM 50 MG PO TABS: 50.0000 mg | ORAL_TABLET | Freq: Every day | ORAL | Status: DC

## 2012-11-29 NOTE — Progress Notes (Signed)
  Subjective:    Patient ID: [name], male    DOB: January 06, 1934, 79 y.o.   MRN: [id number]  HPI Here for 3 months of a daily tickling cough in the back of the throat. Sometimes this produces clear sputum and sometimes it is dry. He feels well in general. No SOB or fever. No sinus pressure or PND.    Review of Systems  Constitutional: Negative.   HENT: Negative.   Respiratory: Positive for cough. Negative for choking, chest tightness, shortness of breath and wheezing.   Cardiovascular: Negative.        Objective:   Physical Exam  Constitutional: He appears well-developed and well-nourished.  HENT:  Right Ear: External ear normal.  Left Ear: External ear normal.  Nose: Nose normal.  Mouth/Throat: Oropharynx is clear and moist. No oropharyngeal exudate.  Eyes: Conjunctivae are normal.  Neck: No thyromegaly present.  Cardiovascular: Normal rate, regular rhythm, normal heart sounds and intact distal pulses.   Pulmonary/Chest: Effort normal and breath sounds normal.  Lymphadenopathy:    He has no cervical adenopathy.          Assessment & Plan:  This is probably an ACE inhibitor cough. Stop this and switch to Losartan. Get a CXR.

## 2012-11-30 ENCOUNTER — Ambulatory Visit (INDEPENDENT_AMBULATORY_CARE_PROVIDER_SITE_OTHER)
Admission: RE | Admit: 2012-11-30 | Discharge: 2012-11-30 | Disposition: A | Discharge status: Home / Self Care | Payer: Medicare Other | Source: Ambulatory Visit | Attending: Family Medicine | Admitting: Family Medicine

## 2012-11-30 DIAGNOSIS — R05 Cough: Secondary | ICD-10-CM

## 2012-11-30 NOTE — Progress Notes (Signed)
Quick Note:  I spoke with pt ______ 

## 2012-12-03 ENCOUNTER — Telehealth: Payer: Self-pay | Admitting: Family Medicine

## 2012-12-03 MED ORDER — HYDROCODONE-HOMATROPINE 5-1.5 MG/5ML PO SYRP: 5.0000 mL | ORAL_SOLUTION | ORAL | Status: DC | PRN

## 2012-12-03 NOTE — Telephone Encounter (Signed)
I called in script and spoke with pt. 

## 2012-12-03 NOTE — Telephone Encounter (Signed)
Call in Hydromet 5 ml q 4 hours prn, 240 ml with no rf

## 2012-12-03 NOTE — Telephone Encounter (Signed)
Pharm called for pt. Pt understood he was to have something called in for his cough after he saw Dr Clent Ridges on 6/23. Pharm advised pt to call office.

## 2012-12-03 NOTE — Telephone Encounter (Signed)
Pt following up for clarification about some meds for cough.

## 2012-12-07 DIAGNOSIS — G5600 Carpal tunnel syndrome, unspecified upper limb: Secondary | ICD-10-CM | POA: Diagnosis not present

## 2012-12-17 ENCOUNTER — Ambulatory Visit (INDEPENDENT_AMBULATORY_CARE_PROVIDER_SITE_OTHER): Payer: Medicare Other

## 2012-12-17 DIAGNOSIS — I6350 Cerebral infarction due to unspecified occlusion or stenosis of unspecified cerebral artery: Secondary | ICD-10-CM

## 2012-12-17 DIAGNOSIS — I4892 Unspecified atrial flutter: Secondary | ICD-10-CM

## 2012-12-17 DIAGNOSIS — I639 Cerebral infarction, unspecified: Secondary | ICD-10-CM

## 2012-12-17 LAB — POCT INR: INR: 3.4

## 2013-01-08 IMAGING — CR DG CHEST 2V
2 series · 2 of 2 positions shown · non-contrast
Comparison: There is a chest x-ray of 05/23/2007

CLINICAL DATA: Preop for knee surgery

CHEST - 2 VIEW

[w chest pa]
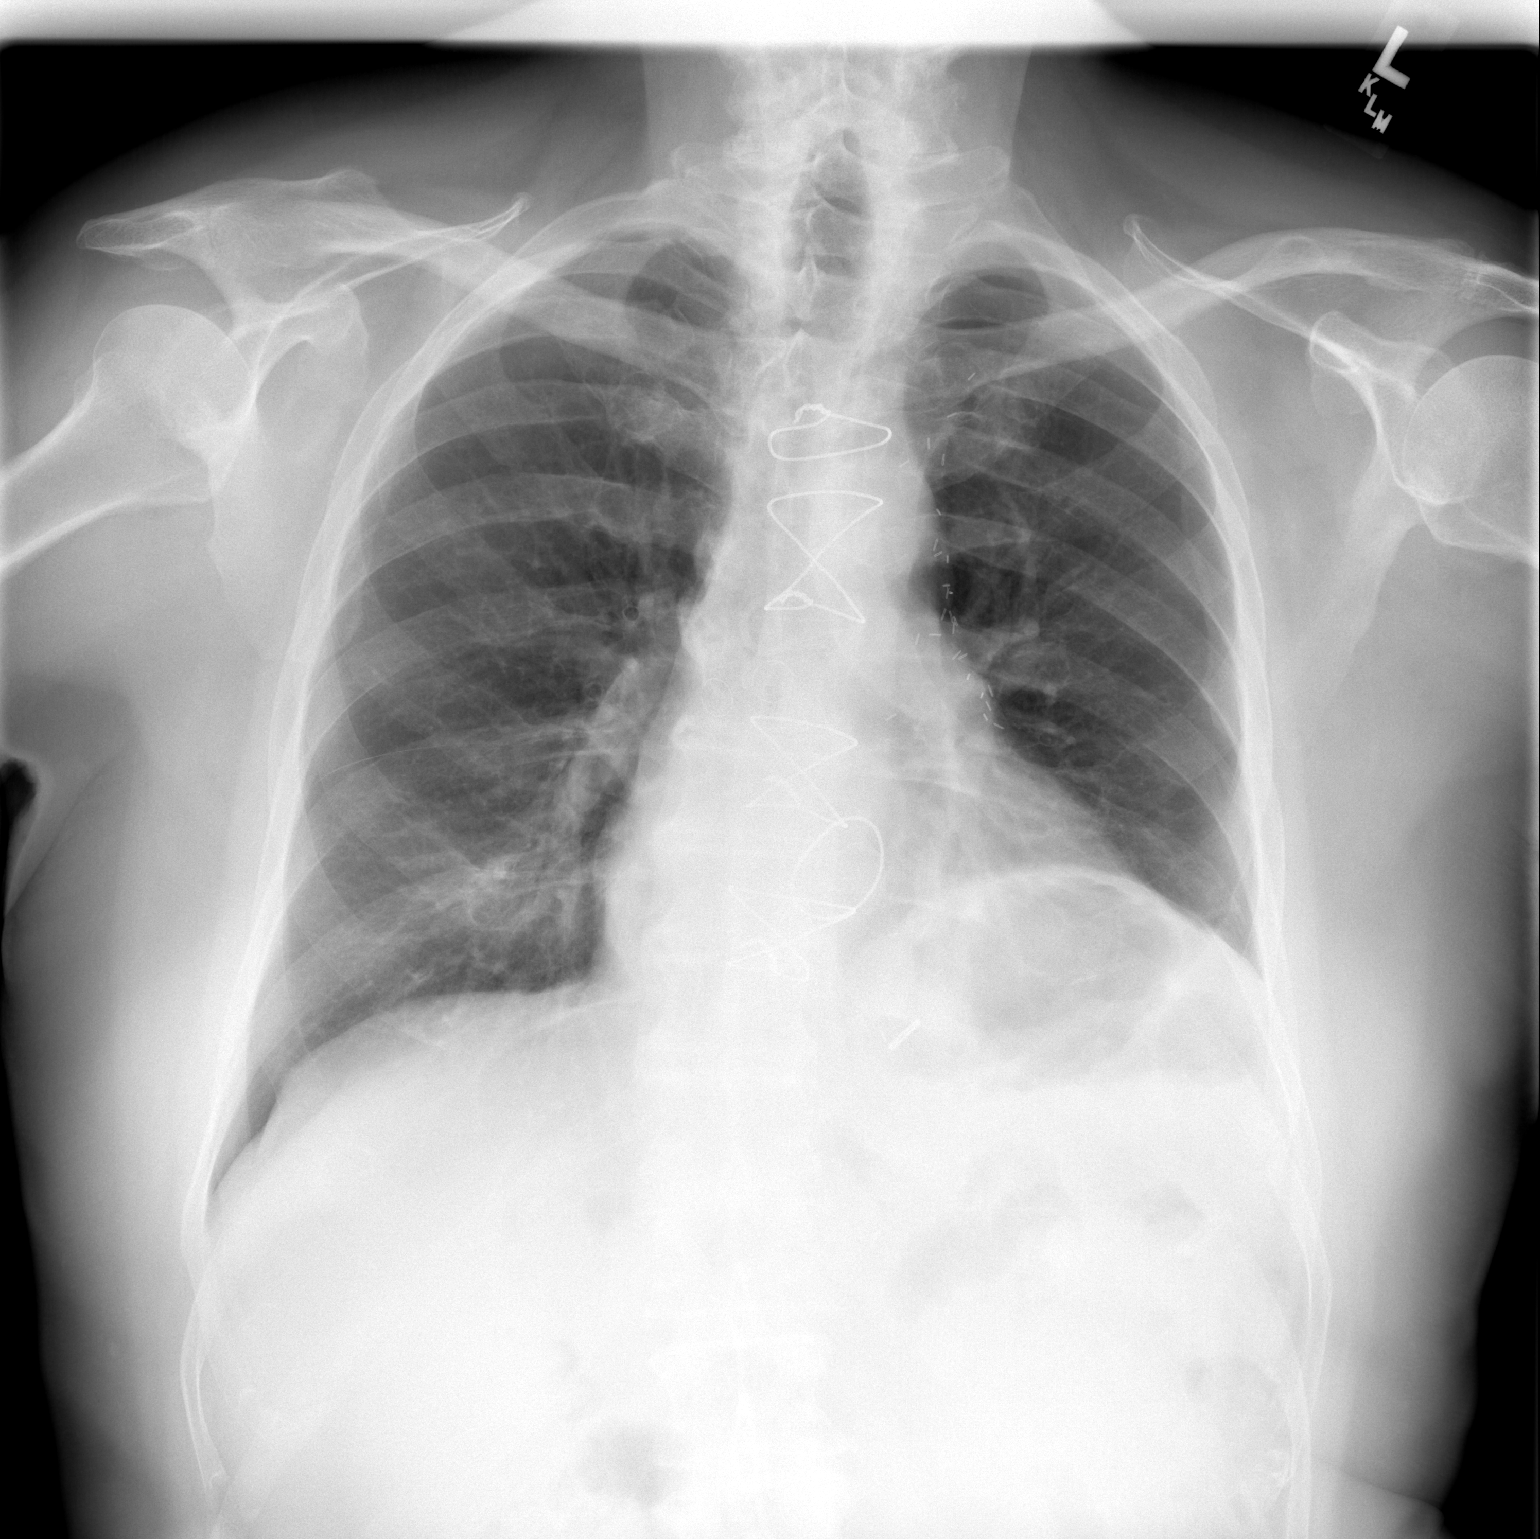

[w chest lat]
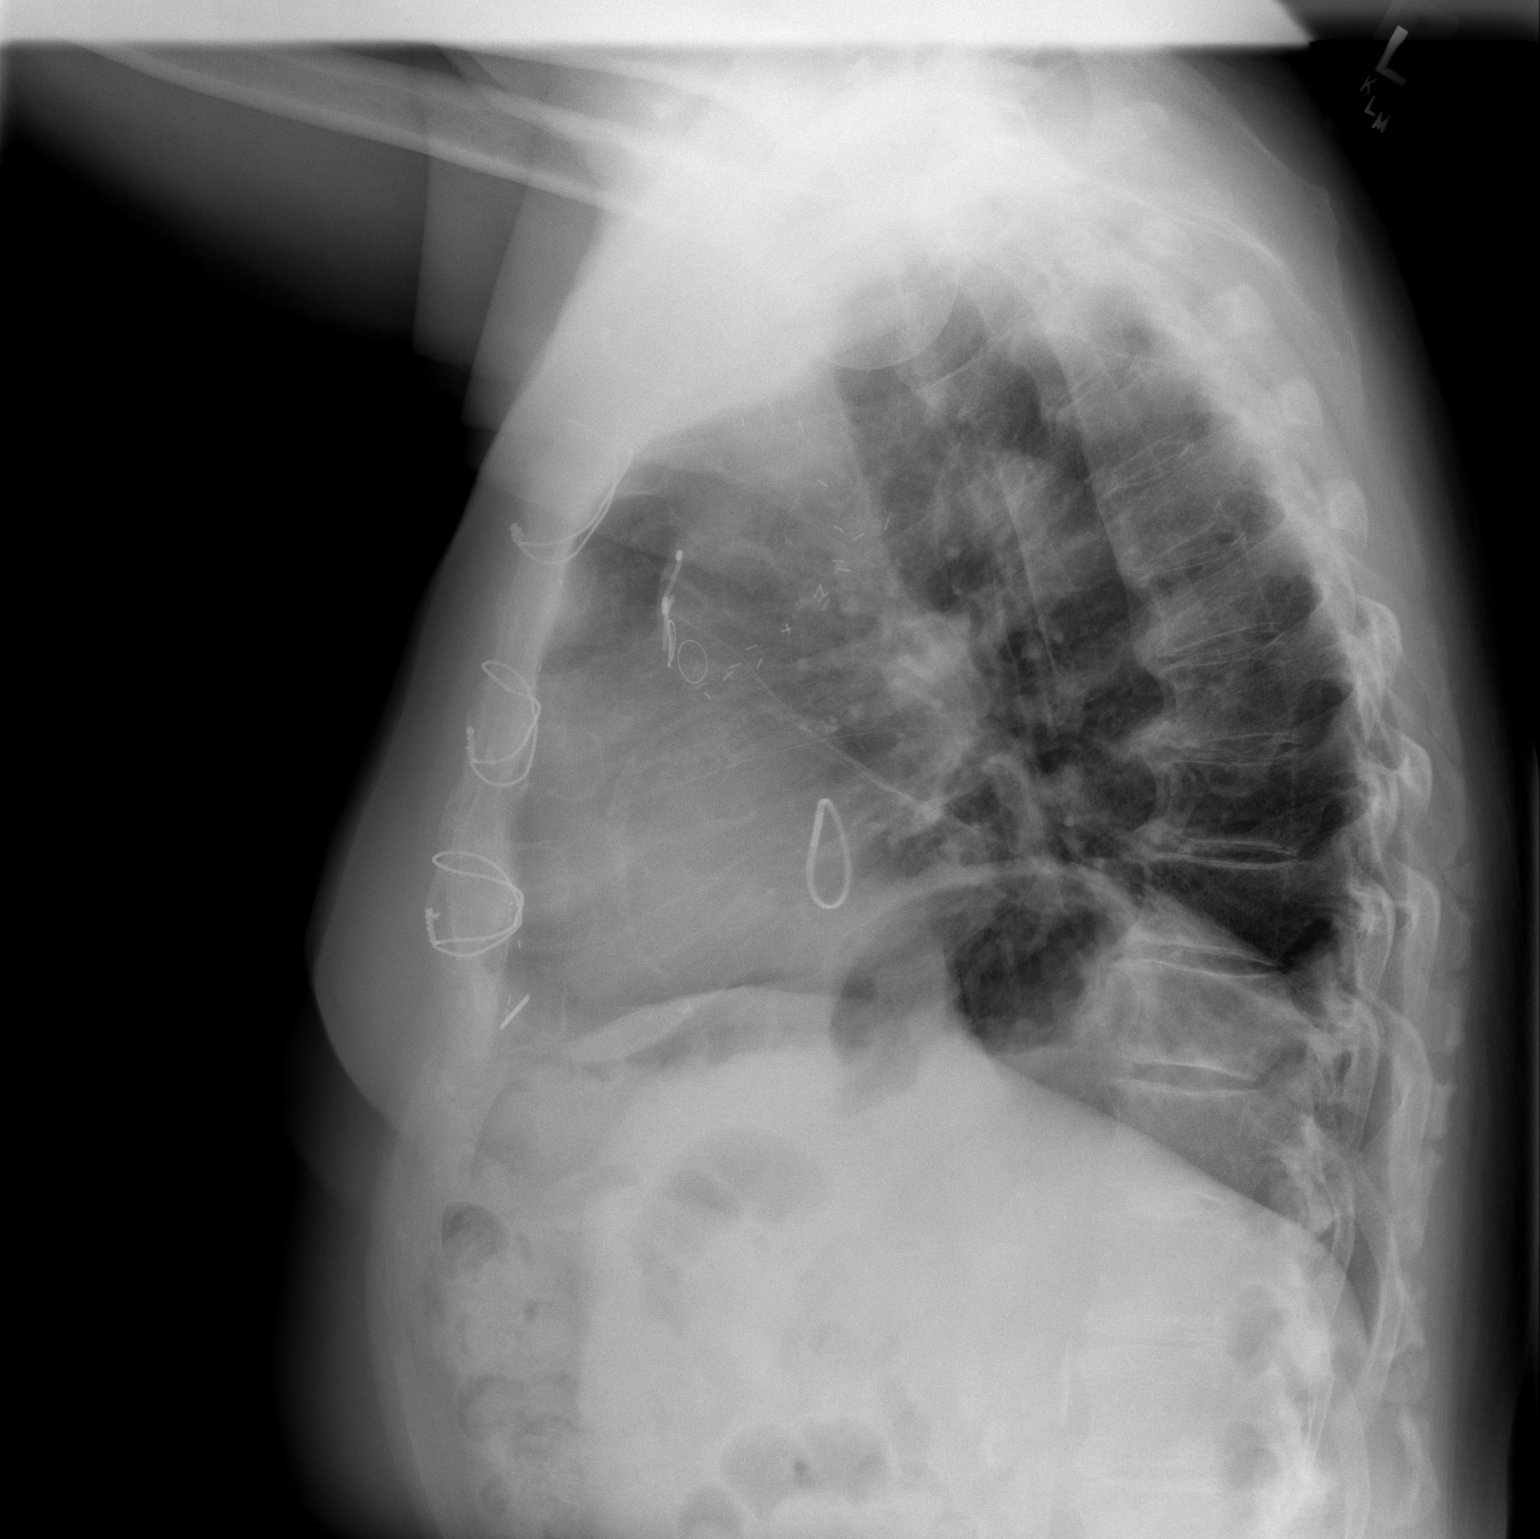

[2 of 2 positions shown; findings below may reference images not displayed]

FINDINGS: There is stable chronic elevation of the left
hemidiaphragm with mild volume loss at the left lung base.  The
right lung is clear.  The heart is mildly enlarged and stable.
Median sternotomy sutures are noted from prior CABG and mitral
valve replacement.  Degenerative changes are noted throughout the
thoracic spine.
IMPRESSION: Stable chest x-ray with chronic elevation of the left
hemidiaphragm.  No active lung disease.  Stable mild cardiomegaly.

## 2013-01-12 DIAGNOSIS — Z961 Presence of intraocular lens: Secondary | ICD-10-CM | POA: Diagnosis not present

## 2013-01-12 DIAGNOSIS — H349 Unspecified retinal vascular occlusion: Secondary | ICD-10-CM | POA: Diagnosis not present

## 2013-01-12 DIAGNOSIS — H2640 Unspecified secondary cataract: Secondary | ICD-10-CM | POA: Diagnosis not present

## 2013-01-12 DIAGNOSIS — H52209 Unspecified astigmatism, unspecified eye: Secondary | ICD-10-CM | POA: Diagnosis not present

## 2013-01-14 DIAGNOSIS — M542 Cervicalgia: Secondary | ICD-10-CM | POA: Diagnosis not present

## 2013-01-14 DIAGNOSIS — G5600 Carpal tunnel syndrome, unspecified upper limb: Secondary | ICD-10-CM | POA: Diagnosis not present

## 2013-01-18 DIAGNOSIS — H26499 Other secondary cataract, unspecified eye: Secondary | ICD-10-CM | POA: Diagnosis not present

## 2013-01-18 DIAGNOSIS — H2640 Unspecified secondary cataract: Secondary | ICD-10-CM | POA: Diagnosis not present

## 2013-01-24 DIAGNOSIS — G5600 Carpal tunnel syndrome, unspecified upper limb: Secondary | ICD-10-CM | POA: Diagnosis not present

## 2013-01-28 ENCOUNTER — Ambulatory Visit (INDEPENDENT_AMBULATORY_CARE_PROVIDER_SITE_OTHER): Payer: Medicare Other | Admitting: Pharmacist

## 2013-01-28 DIAGNOSIS — I4892 Unspecified atrial flutter: Secondary | ICD-10-CM

## 2013-01-28 DIAGNOSIS — I639 Cerebral infarction, unspecified: Secondary | ICD-10-CM

## 2013-01-28 DIAGNOSIS — I6350 Cerebral infarction due to unspecified occlusion or stenosis of unspecified cerebral artery: Secondary | ICD-10-CM | POA: Diagnosis not present

## 2013-01-28 MED ORDER — WARFARIN SODIUM 5 MG PO TABS: ORAL_TABLET | ORAL | Status: DC

## 2013-02-04 ENCOUNTER — Encounter: Payer: Self-pay | Admitting: Internal Medicine

## 2013-02-04 ENCOUNTER — Ambulatory Visit (INDEPENDENT_AMBULATORY_CARE_PROVIDER_SITE_OTHER): Payer: Medicare Other | Admitting: Internal Medicine

## 2013-02-04 VITALS — BP 125/69 | HR 69 | Ht 62.0 in | Wt 189.6 lb

## 2013-02-04 DIAGNOSIS — I2510 Atherosclerotic heart disease of native coronary artery without angina pectoris: Secondary | ICD-10-CM

## 2013-02-04 DIAGNOSIS — I4892 Unspecified atrial flutter: Secondary | ICD-10-CM | POA: Diagnosis not present

## 2013-02-04 NOTE — Progress Notes (Signed)
Patient Care Team: Nelwyn Salisbury, MD as PCP - General   HPI  [name] is a 79 y.o. male seen in followup of atrial flutter and a recent stroke manifesting as central retinal artery occlusion. 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 LVEF by echo 2010 Normal   The patient denies chest pain, shortness of breath, nocturnal dyspnea, orthopnea or peripheral edema.  There have been no palpitations, lightheadedness or syncope.      Past Medical History  Diagnosis Date  . Chickenpox   . Blood transfusion abn reaction or complication, no procedure mishap   . History of colonic polyps     adenomatous  . Coronary artery disease-s/p CABG and MV annuloplasty   . Hyperlipidemia   . Hypertension   . Urinary incontinence   . Onychomycosis   . Central retinal artery occlusion--CVA and   . Atrial fibrillation // Atrial flutter   . History of blood clots   . GI bleed     after colonoscopy/polyp removal  . Anemia, posthemorrhagic, acute   . Prostate cancer   . 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 Insomnia   . Arthritis     Hands    Past Surgical History  Procedure Laterality Date  . Mitral valve replacement  2007  . Radical prostatectomy with radiation  2003  . A flutter ablation  2010  . Bladder pump installed  2011    Dr. McDiarmid  . Colonoscopy w/ biopsies and polypectomy  12/17/2010, 12/24/2010    adenomatous polyps and one focal carcinoma in situ, external hemorrhoids, repeat colonoscopy to control post-polypectomy bleed  . Inguinal hernia repair  1993    bilatera  . Colonoscopy  06/23/2011    Procedure: COLONOSCOPY;  Surgeon: Iva Boop, MD;  Location: [facility];  Service: Endoscopy;  Laterality: N/A;  . Mitral valve ring placement      Per pt he did not have a  mitral valve replacement  . Coronary artery bypass graft  1989    4 vessels  . Coronary artery bypass graft  2007    and mitral valve replacement   . Eye surgery      Catarat with Lens Bil  . Cancer of right thumb nail    . Total knee arthroplasty  08/27/2011    Procedure: TOTAL KNEE ARTHROPLASTY;  Surgeon: Loreta Ave, MD;  Location: [facility] OR;  Service: Orthopedics;  Laterality: Left;  90 MINUTES FOR SURGERY    Current Outpatient Prescriptions  Medication Sig Dispense Refill  . ALPRAZolam (XANAX) 1 MG tablet Take 1 tablet (1 mg total) by mouth at bedtime as needed for sleep.  30 tablet  5  . atorvastatin (LIPITOR) 40 MG tablet Take 1 tablet (40 mg total) by mouth daily.  90 tablet  3  . HYDROcodone-homatropine (HYDROMET) 5-1.5 MG/5ML syrup Take 5 mLs by mouth every 4 (four) hours as needed for cough.  240 mL  0  . losartan (COZAAR) 50 MG tablet Take 1 tablet (50 mg total) by mouth daily.  90 tablet  3  . metoprolol tartrate (LOPRESSOR) 25 MG tablet Take 0.5 tablets (12.5 mg total) by mouth daily.  30 tablet  6  . warfarin (COUMADIN) 5 MG tablet Take as directed by anticoagulation clinic  140 tablet  0   No current facility-administered medications for this visit.    Allergies  Allergen Reactions  . Lisinopril Cough  . Niacin     contraindication    Review of Systems negative except from HPI and PMH  Physical Exam BP 125/69  Pulse 69  Ht 5\' 2"  (1.575 m)  Wt 189 lb 9.6 oz (86.002 kg)  BMI 34.67 kg/m2 Well developed and well nourished in no acute distress HENT normal E scleral and icterus clear Neck Supple JVP flat; carotids brisk and full Clear to ausculation irregularly irregular  no murmurs gallops or rub Soft with active bowel sounds No clubbing cyanosis none Edema Alert and oriented, grossly normal motor and sensory function Skin Warm and Dry  ECG demonstrates atrial fibrillation at 69 intervals-/110//date  Assessment and  Plan   1-atrial  fibrillation-permanent. We'll continue anticoagulation. Rate control seems adequate  2-coronary artery disease and mitral valve repair. It has been many years since his bypass surgery. We'll undertake a  Myoview testing 3-

## 2013-02-04 NOTE — Patient Instructions (Addendum)
The current medical regimen is effective;  continue present plan and medications.  Your physician has requested that you have a lexiscan myoview. For further information please visit [email or web address] Please follow instruction sheet, as given.  Follow up in 1 year with Dr Graciela Husbands.  You will receive a letter in the mail 2 months before you are due.  Please call us when you receive this letter to schedule your follow up appointment.

## 2013-02-18 ENCOUNTER — Other Ambulatory Visit: Payer: Self-pay | Admitting: Orthopedic Surgery

## 2013-02-21 ENCOUNTER — Encounter (HOSPITAL_BASED_OUTPATIENT_CLINIC_OR_DEPARTMENT_OTHER): Payer: Self-pay | Admitting: *Deleted

## 2013-02-21 ENCOUNTER — Encounter (HOSPITAL_COMMUNITY): Payer: Medicare Other

## 2013-02-21 NOTE — Progress Notes (Signed)
To come in for bmet-pt ptt-was not told to hold coumadin

## 2013-02-22 ENCOUNTER — Encounter (HOSPITAL_BASED_OUTPATIENT_CLINIC_OR_DEPARTMENT_OTHER)
Admission: RE | Admit: 2013-02-22 | Discharge: 2013-02-22 | Disposition: A | Discharge status: Home / Self Care | Payer: Medicare Other | Source: Ambulatory Visit | Attending: Orthopedic Surgery | Admitting: Orthopedic Surgery

## 2013-02-22 DIAGNOSIS — Z5309 Procedure and treatment not carried out because of other contraindication: Secondary | ICD-10-CM | POA: Diagnosis not present

## 2013-02-22 DIAGNOSIS — Z01812 Encounter for preprocedural laboratory examination: Secondary | ICD-10-CM | POA: Diagnosis not present

## 2013-02-22 DIAGNOSIS — R6889 Other general symptoms and signs: Secondary | ICD-10-CM | POA: Diagnosis not present

## 2013-02-22 DIAGNOSIS — I10 Essential (primary) hypertension: Secondary | ICD-10-CM | POA: Diagnosis not present

## 2013-02-22 DIAGNOSIS — G5600 Carpal tunnel syndrome, unspecified upper limb: Secondary | ICD-10-CM | POA: Diagnosis not present

## 2013-02-22 LAB — BASIC METABOLIC PANEL
CO2: 28 mEq/L (ref 19–32)
Calcium: 9.3 mg/dL (ref 8.4–10.5)
Creatinine, Ser: 0.95 mg/dL (ref 0.50–1.35)
GFR calc non Af Amer: 79 mL/min — ABNORMAL LOW (ref >90)

## 2013-02-22 LAB — PROTIME-INR: Prothrombin Time: 35 seconds — ABNORMAL HIGH (ref 11.6–15.2)

## 2013-02-23 ENCOUNTER — Ambulatory Visit (HOSPITAL_BASED_OUTPATIENT_CLINIC_OR_DEPARTMENT_OTHER): Payer: Medicare Other | Admitting: Radiology

## 2013-02-23 VITALS — BP 116/61 | Ht 62.0 in | Wt 185.0 lb

## 2013-02-23 DIAGNOSIS — I4892 Unspecified atrial flutter: Secondary | ICD-10-CM

## 2013-02-23 DIAGNOSIS — G5600 Carpal tunnel syndrome, unspecified upper limb: Secondary | ICD-10-CM | POA: Diagnosis not present

## 2013-02-23 DIAGNOSIS — R6889 Other general symptoms and signs: Secondary | ICD-10-CM | POA: Diagnosis not present

## 2013-02-23 DIAGNOSIS — Z01812 Encounter for preprocedural laboratory examination: Secondary | ICD-10-CM | POA: Diagnosis not present

## 2013-02-23 DIAGNOSIS — I2510 Atherosclerotic heart disease of native coronary artery without angina pectoris: Secondary | ICD-10-CM

## 2013-02-23 DIAGNOSIS — Z5309 Procedure and treatment not carried out because of other contraindication: Secondary | ICD-10-CM | POA: Diagnosis not present

## 2013-02-23 DIAGNOSIS — I491 Atrial premature depolarization: Secondary | ICD-10-CM | POA: Diagnosis not present

## 2013-02-23 DIAGNOSIS — I10 Essential (primary) hypertension: Secondary | ICD-10-CM | POA: Diagnosis not present

## 2013-02-23 MED ORDER — REGADENOSON 0.4 MG/5ML IV SOLN
0.4000 mg | Freq: Once | INTRAVENOUS | Status: AC
  Administered 2013-02-23: 0.4 mg via INTRAVENOUS

## 2013-02-23 MED ORDER — TECHNETIUM TC 99M SESTAMIBI GENERIC - CARDIOLITE
33.0000 | Freq: Once | INTRAVENOUS | Status: AC | PRN
  Administered 2013-02-23: 33 via INTRAVENOUS

## 2013-02-23 MED ORDER — TECHNETIUM TC 99M SESTAMIBI GENERIC - CARDIOLITE
11.0000 | Freq: Once | INTRAVENOUS | Status: AC | PRN
  Administered 2013-02-23: 11 via INTRAVENOUS

## 2013-02-23 NOTE — Progress Notes (Signed)
[facility] SITE 3 NUCLEAR MED [address] [place], [place] [address] [phone]    Cardiology Nuclear Med Study  [name] is a 79 y.o. male     MRN : [id number]     DOB: 07-17-1933  Procedure Date: 02/23/2013  Nuclear Med Background Indication for Stress Test:  Evaluation for Ischemia and Graft Patency History:  '89 CABG x4, '07 MI-Heart Cath: No report-CABG x2 with MVR, '10 Ablation: AFIB, ECHO: EF: 55-60%, 2/13 MPS: EF: 60% mild peri-infarct ischemia,  Cardiac Risk Factors: Carotid Disease, CVA, Family History - CAD, History of Smoking, Hypertension and Lipids  Symptoms:  Fatigue   Nuclear Pre-Procedure Caffeine/Decaff Intake:  None NPO After: 7:00pm   Lungs:  clear O2 Sat: 98% on room air. IV 0.9% NS with Angio Cath:  20g  IV Site: R Hand  IV Started by:  Cathlyn Parsons, RN  Chest Size (in):  42 Cup Size: n/a  Height: 5\' 2"  (1.575 m)  Weight:  185 lb (83.915 kg)  BMI:  Body mass index is 33.83 kg/(m^2). Tech Comments:  Lopressor taken at 2030 02/23/13    Nuclear Med Study 1 or 2 day study: 1 day  Stress Test Type:  Treadmill/Lexiscan  Reading MD: Willa Rough, MD  Order Authorizing Provider:  Ferman Hamming  Resting Radionuclide: Technetium 14m Sestamibi  Resting Radionuclide Dose: 11.0 mCi   Stress Radionuclide:  Technetium 15m Sestamibi  Stress Radionuclide Dose: 33.0 mCi           Stress Protocol Rest HR: 64 Stress HR: 120  Rest BP: 116/61 Stress BP: 135/61  Exercise Time (min): n/a METS: n/a   Predicted Max HR: 144 bpm % Max HR: 83.33 bpm Rate Pressure Product: 21308   Dose of Adenosine (mg):  n/a Dose of Lexiscan: 0.4 mg  Dose of Atropine (mg): n/a Dose of Dobutamine: n/a mcg/kg/min (at max HR)  Stress Test Technologist: Milana Na, EMT-P  Nuclear Technologist:  Doyne Keel, CNMT     Rest Procedure:  Myocardial perfusion imaging was performed at rest 45 minutes following the intravenous administration of Technetium 65m  Sestamibi. Rest ECG: Atrial fibrillation. Decreased anterior R wave progression.  Stress Procedure:  The patient received IV Lexiscan 0.4 mg over 15-seconds with concurrent low level exercise and then Technetium 50m Sestamibi was injected at 30-seconds while the patient continued walking one more minute.  This patient was sob with the Lexiscan injection. Quantitative spect images were obtained after a 45-minute delay. Stress ECG: No significant change from baseline ECG  QPS Raw Data Images:  Normal; no motion artifact; normal heart/lung ratio. Stress Images:  Large area of moderately severe decreased uptake affecting all segments of the inferior wall the inferolateral wall in the anterolateral wall and the apical cap Rest Images:  Large area of mild decreased activity affecting the inferior wall, inferolateral wall and the anterolateral wall. There is reversibility. Subtraction (SDS):  There is ischemia at the apex of the inferior wall and in the entire inferolateral wall. Transient Ischemic Dilatation (Normal <1.22):  n/a Lung/Heart Ratio (Normal <0.45):  0.36  Quantitative Gated Spect Images QGS EDV:  92 ml QGS ESV:  44 ml  Impression Exercise Capacity:  Lexiscan with low level exercise. BP Response:  Normal blood pressure response. Clinical Symptoms:  Shortness of breath ECG Impression:  No significant ST segment change suggestive of ischemia. Comparison with Prior Nuclear Study:  The study is compared but the report of the study from February, 2013.  Overall  Impression:  Despite the wall motion abnormalities, ejection fraction is still computed as 52%. It is noted that on the prior study it was reported as 60%. Currently there is a large area of scar with peri-infarct ischemia. This was described previously in the inferior wall. Currently it appears to affect the inferior wall, inferolateral wall and anterolateral wall. I cannot be sure if this truly represents a change or if this is  related to the more sensitive program that is being used with the new camera. LV Ejection Fraction: 52%.  LV Wall Motion:  There is dyssynergy of the septum. There is hypokinesis of the inferior wall.   Willa Rough. MD

## 2013-02-23 NOTE — Progress Notes (Signed)
PT, INR, and PTT results abnormal and called to Dr Merrilee Seashore office, spoke with Maralyn Sago. She will have him review and advise.

## 2013-02-24 ENCOUNTER — Telehealth: Payer: Self-pay | Admitting: Pharmacist

## 2013-02-24 ENCOUNTER — Encounter (HOSPITAL_BASED_OUTPATIENT_CLINIC_OR_DEPARTMENT_OTHER)
Admission: RE | Disposition: A | Discharge status: Home / Self Care | Payer: Self-pay | Source: Ambulatory Visit | Attending: Orthopedic Surgery

## 2013-02-24 ENCOUNTER — Encounter (HOSPITAL_BASED_OUTPATIENT_CLINIC_OR_DEPARTMENT_OTHER): Payer: Self-pay | Admitting: *Deleted

## 2013-02-24 ENCOUNTER — Ambulatory Visit (HOSPITAL_BASED_OUTPATIENT_CLINIC_OR_DEPARTMENT_OTHER)
Admission: RE | Admit: 2013-02-24 | Discharge: 2013-02-24 | Disposition: A | Discharge status: Home / Self Care | Payer: Medicare Other | Source: Ambulatory Visit | Attending: Orthopedic Surgery | Admitting: Orthopedic Surgery

## 2013-02-24 DIAGNOSIS — Z01812 Encounter for preprocedural laboratory examination: Secondary | ICD-10-CM | POA: Insufficient documentation

## 2013-02-24 DIAGNOSIS — I10 Essential (primary) hypertension: Secondary | ICD-10-CM | POA: Insufficient documentation

## 2013-02-24 DIAGNOSIS — Z5309 Procedure and treatment not carried out because of other contraindication: Secondary | ICD-10-CM | POA: Insufficient documentation

## 2013-02-24 DIAGNOSIS — G5600 Carpal tunnel syndrome, unspecified upper limb: Secondary | ICD-10-CM | POA: Insufficient documentation

## 2013-02-24 DIAGNOSIS — R6889 Other general symptoms and signs: Secondary | ICD-10-CM | POA: Insufficient documentation

## 2013-02-24 HISTORY — DX: Blindness, one eye, unspecified eye: H54.40

## 2013-02-24 LAB — APTT: aPTT: 49 seconds — ABNORMAL HIGH (ref 24–37)

## 2013-02-24 SURGERY — CANCELLED PROCEDURE
Anesthesia: Monitor Anesthesia Care | Site: Wrist | Laterality: Right

## 2013-02-24 MED ORDER — FENTANYL CITRATE 0.05 MG/ML IJ SOLN: 50.0000 ug | INTRAMUSCULAR | Status: DC | PRN

## 2013-02-24 MED ORDER — CHLORHEXIDINE GLUCONATE 4 % EX LIQD: 60.0000 mL | Freq: Once | CUTANEOUS | Status: DC

## 2013-02-24 MED ORDER — MIDAZOLAM HCL 2 MG/2ML IJ SOLN: 1.0000 mg | INTRAMUSCULAR | Status: DC | PRN

## 2013-02-24 MED ORDER — FENTANYL CITRATE 0.05 MG/ML IJ SOLN: 50.0000 ug | Freq: Once | INTRAMUSCULAR | Status: DC

## 2013-02-24 MED ORDER — CEFAZOLIN SODIUM-DEXTROSE 2-3 GM-% IV SOLR
2.0000 g | INTRAVENOUS | Status: DC
  Administered 2013-03-01: 2 g via INTRAVENOUS

## 2013-02-24 MED ORDER — LACTATED RINGERS IV SOLN
INTRAVENOUS | Status: DC
  Administered 2013-02-24: 10:00:00 via INTRAVENOUS

## 2013-02-24 SURGICAL SUPPLY — 33 items
BANDAGE ELASTIC 3 VELCRO ST LF (GAUZE/BANDAGES/DRESSINGS) IMPLANT
BANDAGE GAUZE ELAST BULKY 4 IN (GAUZE/BANDAGES/DRESSINGS) IMPLANT
BLADE MINI RND TIP GREEN BEAV (BLADE) IMPLANT
BLADE SURG 15 STRL LF DISP TIS (BLADE) IMPLANT
BLADE SURG 15 STRL SS (BLADE)
BNDG ESMARK 4X9 LF (GAUZE/BANDAGES/DRESSINGS) IMPLANT
CHLORAPREP W/TINT 26ML (MISCELLANEOUS) IMPLANT
CLOTH BEACON ORANGE TIMEOUT ST (SAFETY) IMPLANT
CORDS BIPOLAR (ELECTRODE) IMPLANT
COVER MAYO STAND STRL (DRAPES) IMPLANT
COVER TABLE BACK 60X90 (DRAPES) IMPLANT
CUFF TOURNIQUET SINGLE 18IN (TOURNIQUET CUFF) IMPLANT
DRAPE EXTREMITY T 121X128X90 (DRAPE) IMPLANT
DRAPE SURG 17X23 STRL (DRAPES) IMPLANT
DRSG PAD ABDOMINAL 8X10 ST (GAUZE/BANDAGES/DRESSINGS) IMPLANT
GAUZE XEROFORM 1X8 LF (GAUZE/BANDAGES/DRESSINGS) IMPLANT
GLOVE BIO SURGEON STRL SZ7.5 (GLOVE) IMPLANT
GLOVE BIOGEL PI IND STRL 8 (GLOVE) IMPLANT
GLOVE BIOGEL PI INDICATOR 8 (GLOVE)
GOWN BRE IMP PREV XXLGXLNG (GOWN DISPOSABLE) IMPLANT
GOWN PREVENTION PLUS XLARGE (GOWN DISPOSABLE) IMPLANT
NEEDLE HYPO 25X1 1.5 SAFETY (NEEDLE) IMPLANT
NS IRRIG 1000ML POUR BTL (IV SOLUTION) IMPLANT
PACK BASIN DAY SURGERY FS (CUSTOM PROCEDURE TRAY) IMPLANT
PADDING CAST ABS 4INX4YD NS (CAST SUPPLIES)
PADDING CAST ABS COTTON 4X4 ST (CAST SUPPLIES) IMPLANT
SPONGE GAUZE 4X4 12PLY (GAUZE/BANDAGES/DRESSINGS) IMPLANT
STOCKINETTE 4X48 STRL (DRAPES) IMPLANT
SUT ETHILON 4 0 PS 2 18 (SUTURE) IMPLANT
SYR BULB 3OZ (MISCELLANEOUS) IMPLANT
SYR CONTROL 10ML LL (SYRINGE) IMPLANT
TOWEL OR 17X24 6PK STRL BLUE (TOWEL DISPOSABLE) IMPLANT
UNDERPAD 30X30 INCONTINENT (UNDERPADS AND DIAPERS) IMPLANT

## 2013-02-24 NOTE — Telephone Encounter (Signed)
Spoke with pt.  He was scheduled for procedure on 9/16 but INR was 3.6, it was rescheduled to 9/18 and INR was 3.7.  Surgeon would like INR closer to 2.5 for procedure.  It has been rescheduled to 9/23.  Will have him hold Coumadin 9/21 and 9/22.  He is going to come in for INR check on 9/22 to ensure trending down.

## 2013-02-24 NOTE — Telephone Encounter (Signed)
New Problem  Pt states he went in for carpal tunnel surgery/ states usually coumadin does not stop for this procedure however his blood level was at 3.6 on 9/16 when he went in for the hand surgery. He was told that his blood level was too high to operate... He requests to speak with coumadin clinic to see how to get the range down for the procedure on Tuesday.   Needs to speak with sally to get a handle on his blood level.

## 2013-02-24 NOTE — Anesthesia Preprocedure Evaluation (Addendum)
Anesthesia Evaluation  Patient identified by MRN, date of birth, ID band Patient awake    Reviewed: Allergy & Precautions, H&P , NPO status , Patient's Chart, lab work & pertinent test results  Airway Mallampati: II TM Distance: >3 FB Neck ROM: Full    Dental  (+) Dental Advisory Given   Pulmonary former smoker (quit '79),  breath sounds clear to auscultation  Pulmonary exam normal       Cardiovascular hypertension, Pt. on medications and Pt. on home beta blockers - angina+ CAD, + CABG and + Peripheral Vascular Disease + dysrhythmias (INR 1.7) Atrial Fibrillation + Valvular Problems/Murmurs (s/p mitral ring) Rhythm:Regular Rate:Normal  02/24/13 stress test: EF 52%, dyssynergy of septum, inferior scar with peri-infarct ischemia (discussed with Dr. Graciela Husbands, assures is low risk result)   Neuro/Psych CVA    GI/Hepatic negative GI ROS, Neg liver ROS,   Endo/Other  negative endocrine ROS  Renal/GU negative Renal ROS     Musculoskeletal   Abdominal (+) + obese,   Peds  Hematology   Anesthesia Other Findings   Reproductive/Obstetrics                        Anesthesia Physical Anesthesia Plan  ASA: III  Anesthesia Plan: MAC and Bier Block   Post-op Pain Management:    Induction: Intravenous  Airway Management Planned: Simple Face Mask  Additional Equipment:   Intra-op Plan:   Post-operative Plan:   Informed Consent: I have reviewed the patients History and Physical, chart, labs and discussed the procedure including the risks, benefits and alternatives for the proposed anesthesia with the patient or authorized representative who has indicated his/her understanding and acceptance.   Dental advisory given  Plan Discussed with: CRNA and Surgeon  Anesthesia Plan Comments: (Plan routine monitors, IV regional Lidocaine with MAC)       Anesthesia Quick Evaluation

## 2013-02-24 NOTE — Progress Notes (Signed)
Case cancelled per Dr Merlyn Lot due to lab results. He will reschedule

## 2013-02-25 ENCOUNTER — Encounter (HOSPITAL_BASED_OUTPATIENT_CLINIC_OR_DEPARTMENT_OTHER): Payer: Self-pay | Admitting: *Deleted

## 2013-02-25 NOTE — Progress Notes (Signed)
Surgery has had to be r/s x2 due to INR too high Plan now is to hold coumadin Sunday-go to coumadin clinic Monday to ck-then Tuesday am prior to arrival here-if still too high dr Merlyn Lot told pt he may could do wed

## 2013-02-28 ENCOUNTER — Ambulatory Visit (INDEPENDENT_AMBULATORY_CARE_PROVIDER_SITE_OTHER): Payer: Medicare Other | Admitting: *Deleted

## 2013-02-28 DIAGNOSIS — I6350 Cerebral infarction due to unspecified occlusion or stenosis of unspecified cerebral artery: Secondary | ICD-10-CM | POA: Diagnosis not present

## 2013-02-28 DIAGNOSIS — I4892 Unspecified atrial flutter: Secondary | ICD-10-CM

## 2013-02-28 DIAGNOSIS — I639 Cerebral infarction, unspecified: Secondary | ICD-10-CM

## 2013-03-01 ENCOUNTER — Ambulatory Visit (HOSPITAL_BASED_OUTPATIENT_CLINIC_OR_DEPARTMENT_OTHER)
Admission: RE | Admit: 2013-03-01 | Discharge: 2013-03-01 | Disposition: A | Discharge status: Home / Self Care | Payer: Medicare Other | Source: Ambulatory Visit | Attending: Orthopedic Surgery | Admitting: Orthopedic Surgery

## 2013-03-01 ENCOUNTER — Encounter (HOSPITAL_BASED_OUTPATIENT_CLINIC_OR_DEPARTMENT_OTHER): Payer: Self-pay

## 2013-03-01 ENCOUNTER — Encounter (HOSPITAL_BASED_OUTPATIENT_CLINIC_OR_DEPARTMENT_OTHER): Payer: Self-pay | Admitting: Anesthesiology

## 2013-03-01 ENCOUNTER — Encounter (HOSPITAL_BASED_OUTPATIENT_CLINIC_OR_DEPARTMENT_OTHER)
Admission: RE | Disposition: A | Discharge status: Home / Self Care | Payer: Self-pay | Source: Ambulatory Visit | Attending: Orthopedic Surgery

## 2013-03-01 ENCOUNTER — Telehealth: Payer: Self-pay | Admitting: Internal Medicine

## 2013-03-01 ENCOUNTER — Ambulatory Visit (HOSPITAL_BASED_OUTPATIENT_CLINIC_OR_DEPARTMENT_OTHER): Payer: Medicare Other | Admitting: Anesthesiology

## 2013-03-01 DIAGNOSIS — I4892 Unspecified atrial flutter: Secondary | ICD-10-CM | POA: Diagnosis not present

## 2013-03-01 DIAGNOSIS — I4891 Unspecified atrial fibrillation: Secondary | ICD-10-CM | POA: Diagnosis not present

## 2013-03-01 DIAGNOSIS — G5600 Carpal tunnel syndrome, unspecified upper limb: Secondary | ICD-10-CM | POA: Diagnosis not present

## 2013-03-01 DIAGNOSIS — I10 Essential (primary) hypertension: Secondary | ICD-10-CM | POA: Diagnosis not present

## 2013-03-01 HISTORY — PX: CARPAL TUNNEL RELEASE: SHX101

## 2013-03-01 SURGERY — CARPAL TUNNEL RELEASE
Anesthesia: Monitor Anesthesia Care | Site: Wrist | Laterality: Right | Wound class: Clean

## 2013-03-01 MED ORDER — LACTATED RINGERS IV SOLN
INTRAVENOUS | Status: DC
  Administered 2013-03-01 (×2): via INTRAVENOUS

## 2013-03-01 MED ORDER — OXYCODONE HCL 5 MG/5ML PO SOLN
5.0000 mg | Freq: Once | ORAL | Status: DC | PRN
Start: 2013-03-01 — End: 2013-03-01

## 2013-03-01 MED ORDER — FENTANYL CITRATE 0.05 MG/ML IJ SOLN: 50.0000 ug | INTRAMUSCULAR | Status: DC | PRN

## 2013-03-01 MED ORDER — PROMETHAZINE HCL 25 MG/ML IJ SOLN: 6.2500 mg | INTRAMUSCULAR | Status: DC | PRN

## 2013-03-01 MED ORDER — 0.9 % SODIUM CHLORIDE (POUR BTL) OPTIME
TOPICAL | Status: DC | PRN
  Administered 2013-03-01: 200 mL

## 2013-03-01 MED ORDER — LIDOCAINE HCL (CARDIAC) 20 MG/ML IV SOLN
INTRAVENOUS | Status: DC | PRN
  Administered 2013-03-01: 50 mg via INTRAVENOUS

## 2013-03-01 MED ORDER — MIDAZOLAM HCL 2 MG/2ML IJ SOLN: 0.5000 mg | Freq: Once | INTRAMUSCULAR | Status: DC | PRN

## 2013-03-01 MED ORDER — BUPIVACAINE HCL (PF) 0.25 % IJ SOLN
INTRAMUSCULAR | Status: DC | PRN
  Administered 2013-03-01: 9 mL

## 2013-03-01 MED ORDER — LIDOCAINE HCL (PF) 0.5 % IJ SOLN
INTRAMUSCULAR | Status: DC | PRN
  Administered 2013-03-01: 30 mL via INTRAVENOUS

## 2013-03-01 MED ORDER — FENTANYL CITRATE 0.05 MG/ML IJ SOLN: 25.0000 ug | INTRAMUSCULAR | Status: DC | PRN

## 2013-03-01 MED ORDER — PROPOFOL INFUSION 10 MG/ML OPTIME
INTRAVENOUS | Status: DC | PRN
  Administered 2013-03-01: 50 ug/kg/min via INTRAVENOUS

## 2013-03-01 MED ORDER — MIDAZOLAM HCL 2 MG/2ML IJ SOLN: 1.0000 mg | INTRAMUSCULAR | Status: DC | PRN

## 2013-03-01 MED ORDER — MEPERIDINE HCL 25 MG/ML IJ SOLN: 6.2500 mg | INTRAMUSCULAR | Status: DC | PRN

## 2013-03-01 MED ORDER — MIDAZOLAM HCL 5 MG/5ML IJ SOLN
INTRAMUSCULAR | Status: DC | PRN
  Administered 2013-03-01: 2 mg via INTRAVENOUS

## 2013-03-01 MED ORDER — FENTANYL CITRATE 0.05 MG/ML IJ SOLN
INTRAMUSCULAR | Status: DC | PRN
  Administered 2013-03-01: 50 ug via INTRAVENOUS

## 2013-03-01 MED ORDER — OXYCODONE HCL 5 MG PO TABS: 5.0000 mg | ORAL_TABLET | Freq: Once | ORAL | Status: DC | PRN

## 2013-03-01 MED ORDER — HYDROCODONE-ACETAMINOPHEN 5-325 MG PO TABS: ORAL_TABLET | ORAL | Status: DC

## 2013-03-01 SURGICAL SUPPLY — 37 items
BANDAGE ELASTIC 3 VELCRO ST LF (GAUZE/BANDAGES/DRESSINGS) ×2 IMPLANT
BANDAGE GAUZE ELAST BULKY 4 IN (GAUZE/BANDAGES/DRESSINGS) ×2 IMPLANT
BLADE MINI RND TIP GREEN BEAV (BLADE) IMPLANT
BLADE SURG 15 STRL LF DISP TIS (BLADE) ×2 IMPLANT
BLADE SURG 15 STRL SS (BLADE) ×2
BNDG ESMARK 4X9 LF (GAUZE/BANDAGES/DRESSINGS) IMPLANT
CHLORAPREP W/TINT 26ML (MISCELLANEOUS) ×2 IMPLANT
CLOTH BEACON ORANGE TIMEOUT ST (SAFETY) ×2 IMPLANT
CORDS BIPOLAR (ELECTRODE) ×2 IMPLANT
COVER MAYO STAND STRL (DRAPES) ×2 IMPLANT
COVER TABLE BACK 60X90 (DRAPES) ×2 IMPLANT
CUFF TOURNIQUET SINGLE 18IN (TOURNIQUET CUFF) ×2 IMPLANT
DRAPE EXTREMITY T 121X128X90 (DRAPE) ×2 IMPLANT
DRAPE SURG 17X23 STRL (DRAPES) ×2 IMPLANT
DRSG PAD ABDOMINAL 8X10 ST (GAUZE/BANDAGES/DRESSINGS) ×2 IMPLANT
GAUZE XEROFORM 1X8 LF (GAUZE/BANDAGES/DRESSINGS) ×2 IMPLANT
GLOVE BIO SURGEON STRL SZ7 (GLOVE) ×2 IMPLANT
GLOVE BIO SURGEON STRL SZ7.5 (GLOVE) ×2 IMPLANT
GLOVE BIOGEL PI IND STRL 7.5 (GLOVE) ×1 IMPLANT
GLOVE BIOGEL PI IND STRL 8 (GLOVE) ×1 IMPLANT
GLOVE BIOGEL PI INDICATOR 7.5 (GLOVE) ×1
GLOVE BIOGEL PI INDICATOR 8 (GLOVE) ×1
GLOVE EXAM NITRILE MD LF STRL (GLOVE) ×2 IMPLANT
GOWN BRE IMP PREV XXLGXLNG (GOWN DISPOSABLE) ×2 IMPLANT
GOWN PREVENTION PLUS XLARGE (GOWN DISPOSABLE) ×2 IMPLANT
NEEDLE HYPO 25X1 1.5 SAFETY (NEEDLE) ×2 IMPLANT
NS IRRIG 1000ML POUR BTL (IV SOLUTION) ×2 IMPLANT
PACK BASIN DAY SURGERY FS (CUSTOM PROCEDURE TRAY) ×2 IMPLANT
PADDING CAST ABS 4INX4YD NS (CAST SUPPLIES) ×1
PADDING CAST ABS COTTON 4X4 ST (CAST SUPPLIES) ×1 IMPLANT
SPONGE GAUZE 4X4 12PLY (GAUZE/BANDAGES/DRESSINGS) ×2 IMPLANT
STOCKINETTE 4X48 STRL (DRAPES) ×2 IMPLANT
SUT ETHILON 4 0 PS 2 18 (SUTURE) ×2 IMPLANT
SYR BULB 3OZ (MISCELLANEOUS) ×2 IMPLANT
SYR CONTROL 10ML LL (SYRINGE) ×2 IMPLANT
TOWEL OR 17X24 6PK STRL BLUE (TOWEL DISPOSABLE) ×2 IMPLANT
UNDERPAD 30X30 INCONTINENT (UNDERPADS AND DIAPERS) ×2 IMPLANT

## 2013-03-01 NOTE — Telephone Encounter (Signed)
error 

## 2013-03-01 NOTE — Op Note (Signed)
[id number] 

## 2013-03-01 NOTE — Brief Op Note (Signed)
03/01/2013  2:41 PM  PATIENT:  Hulda Marin Maka  79 y.o. male  PRE-OPERATIVE DIAGNOSIS:  Right carpal tunnel  POST-OPERATIVE DIAGNOSIS:  Right carpal tunnel  PROCEDURE:  Procedure(s): RIGHT CARPAL TUNNEL RELEASE  SURGEON:  Surgeon(s): Tami Ribas, MD  PHYSICIAN ASSISTANT:   ASSISTANTS: none   ANESTHESIA:   Bier block  EBL:  Total I/O In: 2500 [I.V.:2500] Out: -   DRAINS: none   LOCAL MEDICATIONS USED:  MARCAINE     SPECIMEN:  No Specimen  DISPOSITION OF SPECIMEN:  N/A  COUNTS:  YES  TOURNIQUET:   Total Tourniquet Time Documented: Forearm (Right) - 29 minutes Total: Forearm (Right) - 29 minutes   DICTATION: .Other Dictation: Dictation Number [id number]  PLAN OF CARE: Discharge to home after PACU

## 2013-03-01 NOTE — Transfer of Care (Signed)
Immediate Anesthesia Transfer of Care Note  Patient: [name]  Procedure(s) Performed: Procedure(s): RIGHT CARPAL TUNNEL RELEASE (Right)  Patient Location: PACU  Anesthesia Type:MAC and Bier block  Level of Consciousness: awake, alert  and oriented  Airway & Oxygen Therapy: Patient Spontanous Breathing and Patient connected to face mask oxygen  Post-op Assessment: Report given to PACU RN and Post -op Vital signs reviewed and stable  Post vital signs: Reviewed and stable  Complications: No apparent anesthesia complications

## 2013-03-01 NOTE — Anesthesia Postprocedure Evaluation (Signed)
  Anesthesia Post-op Note  Patient: [name]  Procedure(s) Performed: Procedure(s): RIGHT CARPAL TUNNEL RELEASE (Right)  Patient Location: PACU  Anesthesia Type:Bier block  Level of Consciousness: awake, alert , oriented and patient cooperative  Airway and Oxygen Therapy: Patient Spontanous Breathing  Post-op Pain: none  Post-op Assessment: Post-op Vital signs reviewed, Patient's Cardiovascular Status Stable, Respiratory Function Stable, Patent Airway, No signs of Nausea or vomiting and Pain level controlled  Post-op Vital Signs: Reviewed and stable  Complications: No apparent anesthesia complications

## 2013-03-01 NOTE — H&P (Signed)
[name] is an 79 y.o. male.   Chief Complaint: carpal tunnel syndrome HPI: 79 yo rhd male with pins and needles sensation in index, long, ring fingers of bilateral hands.  Nocturnal symptoms that wake him.  Positive nerve conduction studies.  He wishes to have carpal tunnel release for management of his symptoms.    Past Medical History  Diagnosis Date  . Chickenpox   . Blood transfusion abn reaction or complication, no procedure mishap   . History of colonic polyps     adenomatous  . Coronary artery disease-s/p CABG and MV annuloplasty   . Hyperlipidemia   . Hypertension   . Urinary incontinence   . Onychomycosis   . Central retinal artery occlusion--CVA and   . Atrial fibrillation // Atrial flutter   . History of blood clots   . GI bleed     after colonoscopy/polyp removal  . Anemia, posthemorrhagic, acute   . Prostate cancer   . 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 Insomnia   . Arthritis     Hands  . 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 Mitral valve replacement  2007  . Radical prostatectomy with radiation  2003  . A flutter ablation  2010  . Bladder pump installed  2011    Dr. McDiarmid  . Colonoscopy w/ biopsies and polypectomy  12/17/2010, 12/24/2010    adenomatous polyps and one focal carcinoma in situ, external hemorrhoids, repeat colonoscopy to control post-polypectomy bleed  . Inguinal hernia repair  1993    bilatera  . Colonoscopy  06/23/2011    Procedure: COLONOSCOPY;  Surgeon: Iva Boop, MD;  Location: [facility];  Service: Endoscopy;  Laterality: N/A;  . Mitral valve ring placement      Per pt he did not have a mitral valve replacement  . Coronary artery bypass graft  1989    4 vessels  . Coronary artery bypass graft  2007    and mitral valve replacement   . Eye surgery      Catarat with Lens Bil  . Cancer of right thumb nail    . Total knee arthroplasty  08/27/2011    Procedure: TOTAL KNEE  ARTHROPLASTY;  Surgeon: Loreta Ave, MD;  Location: [facility] OR;  Service: Orthopedics;  Laterality: Left;  90 MINUTES FOR SURGERY    Family History  Problem Relation Age of Onset  . Alcohol abuse Mother   . Hypertension Mother   . Stroke Mother   . Diabetes Mother   . Coronary artery disease Father   . Cancer Daughter     renal cell cancer  . Colon cancer Neg Hx   . Esophageal cancer Neg Hx   . Stomach cancer Neg Hx   . Anesthesia problems Neg Hx    Social History:  reports that he quit smoking about 35 years ago. He has never used smokeless tobacco. He reports that  drinks alcohol. He reports that he does not use illicit drugs.  Allergies:  Allergies  Allergen Reactions  . Lisinopril Cough  . Niacin     contraindication    Medications Prior to Admission  Medication Sig Dispense Refill  . ALPRAZolam (XANAX) 1 MG tablet Take 1 tablet (1 mg total) by mouth at bedtime as needed for sleep.  30 tablet  5  . atorvastatin (LIPITOR) 40 MG tablet Take 1 tablet (40 mg total) by mouth  daily.  90 tablet  3  . HYDROcodone-homatropine (HYDROMET) 5-1.5 MG/5ML syrup Take 5 mLs by mouth every 4 (four) hours as needed for cough.  240 mL  0  . losartan (COZAAR) 50 MG tablet Take 1 tablet (50 mg total) by mouth daily.  90 tablet  3  . metoprolol tartrate (LOPRESSOR) 25 MG tablet Take 0.5 tablets (12.5 mg total) by mouth daily.  30 tablet  6  . warfarin (COUMADIN) 5 MG tablet Take as directed by anticoagulation clinic  140 tablet  0    Results for orders placed in visit on 02/28/13 (from the past 48 hour(s))  POCT INR     Status: None   Collection Time    02/28/13  2:31 PM      Result Value Range   INR 1.7      No results found.   A comprehensive review of systems was negative except for: Eyes: positive for cataracts and contacts/glasses Ears, nose, mouth, throat, and face: positive for hearing loss Hematologic/lymphatic: positive for bleeding and easy bruising  Blood pressure 122/66,  pulse 54, temperature 97.5 F (36.4 C), temperature source Oral, resp. rate 18, height 5\' 3"  (1.6 m), weight 186 lb 6.4 oz (84.55 kg), SpO2 97.00%.  General appearance: alert, cooperative and appears stated age Head: Normocephalic, without obvious abnormality, atraumatic Neck: supple, symmetrical, trachea midline Resp: clear to auscultation bilaterally Cardio: regular rate and rhythm GI: non tender Extremities: intact sensation and capillary refill all digits.  +epl/fpl/io.  skin intact. Pulses: 2+ and symmetric Skin: Skin color, texture, turgor normal. No rashes or lesions Neurologic: Grossly normal Incision/Wound: na  Assessment/Plan Bilateral carpal tunnel syndrome.  Non operative and operative treatment options were discussed with the patient and patient wishes to proceed with operative treatment. Risks, benefits, and alternatives of surgery were discussed and the patient agrees with the plan of care.  Plan for right carpal tunnel release.  Roxine Whittinghill R 03/01/2013, 12:00 PM

## 2013-03-01 NOTE — Anesthesia Procedure Notes (Signed)
Procedure Name: MAC Date/Time: 03/01/2013 2:12 PM Performed by: Zenia Resides D Pre-anesthesia Checklist: Patient identified, Emergency Drugs available, Suction available, Patient being monitored and Timeout performed Oxygen Delivery Method: Simple face mask Placement Confirmation: CO2 detector and positive ETCO2

## 2013-03-02 ENCOUNTER — Encounter (HOSPITAL_BASED_OUTPATIENT_CLINIC_OR_DEPARTMENT_OTHER): Payer: Self-pay | Admitting: Orthopedic Surgery

## 2013-03-02 NOTE — Telephone Encounter (Signed)
New Problem,  Pt states he was told that there were minor problems with stress test// ok for operations// pt is concerned w/ the amount of exercise that he is allowed to do and the medications that he should take... Should he carry nitro with him at all times.. if so he needs a rx sent to [facility]. pt states he is also on blood thinners and if he has a problem is he able to take an asprin.[name] also request a summury for his stress test and a future follow up stress test.

## 2013-03-02 NOTE — Op Note (Signed)
NAMEAMARIO, [name] NO.:  [id number]  MEDICAL RECORD NO.:  [id number]  LOCATION:                                 FACILITY:  PHYSICIAN:  Betha Loa, MD        DATE OF BIRTH:  30-Nov-1933  DATE OF PROCEDURE:  03/01/2013 DATE OF DISCHARGE:                              OPERATIVE REPORT   PREOPERATIVE DIAGNOSIS:  Right carpal tunnel syndrome.  POSTOPERATIVE DIAGNOSIS:  Right carpal tunnel syndrome.  PROCEDURE:  Right carpal tunnel release.  SURGEON:  Betha Loa, MD  ASSISTANT:  None.  ANESTHESIA:  Bier block with sedation.  IV FLUIDS:  Per anesthesia flow sheet.  ESTIMATED BLOOD LOSS:  Minimal.  COMPLICATIONS:  None.  SPECIMENS:  None.  TOURNIQUET TIME:  29 minutes.  DISPOSITION:  Stable to PACU.  INDICATIONS:  [name] is a 79 year old male who has had pins and needle sensation in the digits.  Positive nerve conduction studies for bilateral carpal tunnel syndrome.  He wished to have carpal tunnel release for management of symptoms.  Risks, benefits and alternatives of surgery were discussed including risk of blood loss, infection, damage to nerves, vessels, tendons, ligaments, bone, failure of surgery, need for additional surgery, complications with wound healing, continued pain, and continued carpal tunnel syndrome.  He voiced understanding of these risks and elected to proceed.  OPERATIVE COURSE:  After being identified preoperatively by myself, the patient and I agreed upon procedure and site of procedure.  Surgical site was marked.  The risks, benefits, and alternatives of surgery were reviewed and he wished to proceed.  Surgical consent had been signed. He was given IV Ancef as preoperative antibiotic prophylaxis.  He was transported to the operating room and placed on the operating room table in supine position with the right upper extremity on arm board.  Bier block anesthesia was induced by anesthesiologist.  The right upper extremity  was prepped and draped in normal sterile orthopedic fashion. A surgical pause was performed between surgeons, anesthesia, and operating room staff, and all were in agreement as to the patient, procedure, and site of procedure.  Tourniquet at the proximal aspect of the forearm had been inflated for the Bier block.  Incision was made over the transverse carpal ligament and carried into subcutaneous tissues by spreading technique.  Bipolar electrocautery was used to obtain hemostasis.  The palmar fascia was split with the knife.  The transverse carpal ligament was identified and was split using knife.  It was split distally and care was taken to ensure complete decompression distally.  It was then split in a proximal direction.  The distal aspect of the volar antebrachial fascia was split as well.  A finger was placed into the wound to ensure complete decompression which was the case.  The nerve was visualized.  It was hyperemic and flattened.  The motor branch was identified and was intact.  The wound was copiously irrigated with sterile saline.  It was closed with 4-0 nylon in a horizontal mattress fashion.  It was injected with 9 mL of 0.25% plain Marcaine to aid in postoperative analgesia.  It was dressed with sterile Xeroform, 4x4s, and ABD  and wrapped with Kerlix and Ace bandage.  Tourniquet was deflated at 29 minutes.  Fingertips were pink with brisk capillary refill after deflation of tourniquet.  Operative drapes were broken down, and the patient was awoken from anesthesia safely.  He was transferred back to the stretcher and taken to the PACU in stable condition.  I will see him back in the office in 1 week for postoperative followup.  I have given him Norco 5/325, 1-2 p.o. q.6 hours p.r.n. pain, dispensed #30.     Betha Loa, MD     KK/MEDQ  D:  03/01/2013  T:  03/02/2013  Job:  [id number]

## 2013-03-03 NOTE — Telephone Encounter (Signed)
Follow Up    Pt following up on stress test results.

## 2013-03-04 NOTE — Telephone Encounter (Signed)
Follow Up:  Pt states he wants the results of his stress test.

## 2013-03-04 NOTE — Telephone Encounter (Signed)
Explained to patient that I could not give any results yet as it has not been reviewed yet. I will route to Dr. Graciela Husbands for follow up. Explained to patient that we would be in touch soon with result answers. Patient anxious to know about exercise and medications. Patient knows Dr. Graciela Husbands is out of office until next week.

## 2013-03-08 ENCOUNTER — Ambulatory Visit (INDEPENDENT_AMBULATORY_CARE_PROVIDER_SITE_OTHER): Payer: Medicare Other | Admitting: Pharmacist

## 2013-03-08 DIAGNOSIS — I639 Cerebral infarction, unspecified: Secondary | ICD-10-CM

## 2013-03-08 DIAGNOSIS — I4892 Unspecified atrial flutter: Secondary | ICD-10-CM | POA: Diagnosis not present

## 2013-03-08 DIAGNOSIS — I6350 Cerebral infarction due to unspecified occlusion or stenosis of unspecified cerebral artery: Secondary | ICD-10-CM

## 2013-03-29 ENCOUNTER — Telehealth: Payer: Self-pay | Admitting: Family Medicine

## 2013-03-29 DIAGNOSIS — N393 Stress incontinence (female) (male): Secondary | ICD-10-CM | POA: Diagnosis not present

## 2013-03-29 DIAGNOSIS — C61 Malignant neoplasm of prostate: Secondary | ICD-10-CM | POA: Diagnosis not present

## 2013-03-29 NOTE — Telephone Encounter (Signed)
Make an OV for [place] to discus these matters. I recommend Dr. Donalee Citrin as a neurosurgeon

## 2013-03-29 NOTE — Telephone Encounter (Signed)
I tried to reach pt and he was not at home. I left a message for pt to leave me a more detailed question about the medication?

## 2013-03-29 NOTE — Telephone Encounter (Signed)
Pt needs referral to back specialist for spinal stenois. Pt would like nurse to return his call concerning medication

## 2013-03-29 NOTE — Telephone Encounter (Signed)
I spoke with pt  

## 2013-03-29 NOTE — Telephone Encounter (Signed)
Pt really just wanted to know if you could recommend a name of a specialist? Also he is still coughing up mucus, he said that you changed his medication to Losartan and symptoms seemed to improve, however symptoms are back. Please advise?

## 2013-04-05 ENCOUNTER — Telehealth: Payer: Self-pay | Admitting: Family Medicine

## 2013-04-05 DIAGNOSIS — M545 Low back pain, unspecified: Secondary | ICD-10-CM | POA: Diagnosis not present

## 2013-04-05 DIAGNOSIS — M48061 Spinal stenosis, lumbar region without neurogenic claudication: Secondary | ICD-10-CM | POA: Diagnosis not present

## 2013-04-05 MED ORDER — ALPRAZOLAM 1 MG PO TABS: 1.0000 mg | ORAL_TABLET | Freq: Every evening | ORAL | Status: DC | PRN

## 2013-04-05 NOTE — Telephone Encounter (Signed)
Call in #30 with 5 rf 

## 2013-04-05 NOTE — Telephone Encounter (Signed)
I called in script 

## 2013-04-05 NOTE — Telephone Encounter (Signed)
Refill request for Alprazolam 1 mg take 1 po qhs prn and send to [facility].

## 2013-04-12 ENCOUNTER — Ambulatory Visit (INDEPENDENT_AMBULATORY_CARE_PROVIDER_SITE_OTHER): Payer: Medicare Other | Admitting: *Deleted

## 2013-04-12 DIAGNOSIS — I639 Cerebral infarction, unspecified: Secondary | ICD-10-CM

## 2013-04-12 DIAGNOSIS — I4892 Unspecified atrial flutter: Secondary | ICD-10-CM | POA: Diagnosis not present

## 2013-04-12 DIAGNOSIS — I6350 Cerebral infarction due to unspecified occlusion or stenosis of unspecified cerebral artery: Secondary | ICD-10-CM | POA: Diagnosis not present

## 2013-05-10 ENCOUNTER — Other Ambulatory Visit: Payer: Self-pay | Admitting: Internal Medicine

## 2013-05-10 ENCOUNTER — Other Ambulatory Visit: Payer: Self-pay | Admitting: Cardiology

## 2013-05-12 ENCOUNTER — Telehealth: Payer: Self-pay | Admitting: Internal Medicine

## 2013-05-12 NOTE — Telephone Encounter (Signed)
Spoke with pt.  He is pending dental work on 12/9 and needs INR <3.5.  Appt made on 12/8 to check INR.

## 2013-05-12 NOTE — Telephone Encounter (Signed)
New Message  Pt called requests a call back from the coumadin dept ( He requested [name] Specifically) pt will need surgical clearance for 12/9// he wants to be sure that his blood levels are not too high. Please call the pt back to discuss.[name]

## 2013-05-13 ENCOUNTER — Telehealth: Payer: Self-pay

## 2013-05-13 ENCOUNTER — Encounter: Payer: Self-pay | Admitting: *Deleted

## 2013-05-13 DIAGNOSIS — Z23 Encounter for immunization: Secondary | ICD-10-CM | POA: Diagnosis not present

## 2013-05-13 NOTE — Telephone Encounter (Signed)
This encounter was created in error - please disregard.

## 2013-05-13 NOTE — Telephone Encounter (Signed)
Patient call about his coumadin refill he only got 15 tabs and he wanted to know why, I sent it to tiffany in coumadin clinic

## 2013-05-16 ENCOUNTER — Ambulatory Visit (INDEPENDENT_AMBULATORY_CARE_PROVIDER_SITE_OTHER): Payer: Medicare Other | Admitting: Pharmacist

## 2013-05-16 ENCOUNTER — Other Ambulatory Visit: Payer: Self-pay | Admitting: Internal Medicine

## 2013-05-16 ENCOUNTER — Telehealth: Payer: Self-pay | Admitting: Pharmacist

## 2013-05-16 DIAGNOSIS — I6350 Cerebral infarction due to unspecified occlusion or stenosis of unspecified cerebral artery: Secondary | ICD-10-CM

## 2013-05-16 DIAGNOSIS — I4892 Unspecified atrial flutter: Secondary | ICD-10-CM

## 2013-05-16 DIAGNOSIS — I639 Cerebral infarction, unspecified: Secondary | ICD-10-CM

## 2013-05-16 LAB — POCT INR: INR: 4.6

## 2013-05-16 NOTE — Telephone Encounter (Deleted)
error 

## 2013-05-18 ENCOUNTER — Ambulatory Visit (INDEPENDENT_AMBULATORY_CARE_PROVIDER_SITE_OTHER): Payer: Medicare Other | Admitting: *Deleted

## 2013-05-18 DIAGNOSIS — I6350 Cerebral infarction due to unspecified occlusion or stenosis of unspecified cerebral artery: Secondary | ICD-10-CM

## 2013-05-18 DIAGNOSIS — I639 Cerebral infarction, unspecified: Secondary | ICD-10-CM

## 2013-05-18 DIAGNOSIS — I4892 Unspecified atrial flutter: Secondary | ICD-10-CM

## 2013-05-18 LAB — POCT INR: INR: 2

## 2013-05-30 ENCOUNTER — Ambulatory Visit (INDEPENDENT_AMBULATORY_CARE_PROVIDER_SITE_OTHER): Payer: Medicare Other | Admitting: Pharmacist

## 2013-05-30 DIAGNOSIS — I639 Cerebral infarction, unspecified: Secondary | ICD-10-CM

## 2013-05-30 DIAGNOSIS — I6350 Cerebral infarction due to unspecified occlusion or stenosis of unspecified cerebral artery: Secondary | ICD-10-CM | POA: Diagnosis not present

## 2013-05-30 DIAGNOSIS — I4892 Unspecified atrial flutter: Secondary | ICD-10-CM | POA: Diagnosis not present

## 2013-06-06 DIAGNOSIS — G5600 Carpal tunnel syndrome, unspecified upper limb: Secondary | ICD-10-CM | POA: Diagnosis not present

## 2013-06-16 ENCOUNTER — Ambulatory Visit (INDEPENDENT_AMBULATORY_CARE_PROVIDER_SITE_OTHER): Payer: Medicare Other

## 2013-06-16 DIAGNOSIS — I6350 Cerebral infarction due to unspecified occlusion or stenosis of unspecified cerebral artery: Secondary | ICD-10-CM

## 2013-06-16 DIAGNOSIS — I4892 Unspecified atrial flutter: Secondary | ICD-10-CM

## 2013-06-16 DIAGNOSIS — I639 Cerebral infarction, unspecified: Secondary | ICD-10-CM

## 2013-06-16 LAB — POCT INR: INR: 4

## 2013-06-20 DIAGNOSIS — G5600 Carpal tunnel syndrome, unspecified upper limb: Secondary | ICD-10-CM | POA: Diagnosis not present

## 2013-06-21 DIAGNOSIS — M25569 Pain in unspecified knee: Secondary | ICD-10-CM | POA: Diagnosis not present

## 2013-07-12 ENCOUNTER — Other Ambulatory Visit: Payer: Self-pay | Admitting: Internal Medicine

## 2013-07-14 ENCOUNTER — Ambulatory Visit (INDEPENDENT_AMBULATORY_CARE_PROVIDER_SITE_OTHER): Payer: Medicare Other

## 2013-07-14 DIAGNOSIS — I639 Cerebral infarction, unspecified: Secondary | ICD-10-CM

## 2013-07-14 DIAGNOSIS — I6350 Cerebral infarction due to unspecified occlusion or stenosis of unspecified cerebral artery: Secondary | ICD-10-CM

## 2013-07-14 DIAGNOSIS — I4892 Unspecified atrial flutter: Secondary | ICD-10-CM

## 2013-07-14 LAB — POCT INR: INR: 3.6

## 2013-08-10 DIAGNOSIS — M7500 Adhesive capsulitis of unspecified shoulder: Secondary | ICD-10-CM | POA: Diagnosis not present

## 2013-08-10 DIAGNOSIS — M25519 Pain in unspecified shoulder: Secondary | ICD-10-CM | POA: Diagnosis not present

## 2013-08-16 ENCOUNTER — Encounter: Payer: Self-pay | Admitting: Internal Medicine

## 2013-08-16 ENCOUNTER — Ambulatory Visit (INDEPENDENT_AMBULATORY_CARE_PROVIDER_SITE_OTHER): Payer: Medicare Other | Admitting: *Deleted

## 2013-08-16 ENCOUNTER — Ambulatory Visit (INDEPENDENT_AMBULATORY_CARE_PROVIDER_SITE_OTHER): Payer: Medicare Other | Admitting: Internal Medicine

## 2013-08-16 VITALS — BP 119/77 | HR 85 | Ht 63.0 in | Wt 191.8 lb

## 2013-08-16 DIAGNOSIS — I4892 Unspecified atrial flutter: Secondary | ICD-10-CM | POA: Diagnosis not present

## 2013-08-16 DIAGNOSIS — I4891 Unspecified atrial fibrillation: Secondary | ICD-10-CM

## 2013-08-16 DIAGNOSIS — I6350 Cerebral infarction due to unspecified occlusion or stenosis of unspecified cerebral artery: Secondary | ICD-10-CM | POA: Diagnosis not present

## 2013-08-16 DIAGNOSIS — I639 Cerebral infarction, unspecified: Secondary | ICD-10-CM

## 2013-08-16 LAB — POCT INR: INR: 3.7

## 2013-08-16 NOTE — Progress Notes (Signed)
Patient Care Team: Laurey Morale, MD as PCP - General   HPI  [name] is a 80 y.o. male seen in followup of atrial flutter and a recent stroke manifesting as central retinal artery occlusion. 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 LVEF by echo 2010 Normal  The patient denies chest pain, shortness of breath, nocturnal dyspnea, orthopnea or peripheral edema. There have been no palpitations, lightheadedness or syncope.    Past Medical History  Diagnosis Date  . Chickenpox   . Blood transfusion abn reaction or complication, no procedure mishap   . History of colonic polyps     adenomatous  . Coronary artery disease-s/p CABG and MV annuloplasty   . Hyperlipidemia   . Hypertension   . Urinary incontinence   . Onychomycosis   . Central retinal artery occlusion--CVA and   . Atrial fibrillation // Atrial flutter   . History of blood clots   . GI bleed     after colonoscopy/polyp removal  . Anemia, posthemorrhagic, acute   . Prostate cancer   . 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 Insomnia   . Arthritis     Hands  . 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 Mitral valve replacement  2007  . Radical prostatectomy with radiation  2003  . A flutter ablation  2010  . Bladder pump installed  2011    Dr. McDiarmid  . Colonoscopy w/ biopsies and polypectomy  12/17/2010, 12/24/2010    adenomatous polyps and one focal carcinoma in situ, external hemorrhoids, repeat colonoscopy to control post-polypectomy bleed  . Inguinal hernia repair  1993    bilatera  . Colonoscopy  06/23/2011    Procedure: COLONOSCOPY;  Surgeon: Gatha Mayer, MD;  Location: [facility];  Service: Endoscopy;  Laterality: N/A;  . Mitral valve ring  placement      Per pt he did not have a mitral valve replacement  . Coronary artery bypass graft  1989    4 vessels  . Coronary artery bypass graft  2007    and mitral valve replacement   . Eye surgery      Catarat with Lens Bil  . Cancer of right thumb nail    . Total knee arthroplasty  08/27/2011    Procedure: TOTAL KNEE ARTHROPLASTY;  Surgeon: Ninetta Lights, MD;  Location: [place];  Service: Orthopedics;  Laterality: Left;  90 MINUTES FOR SURGERY  . Carpal tunnel release Right 03/01/2013    Procedure: RIGHT CARPAL TUNNEL RELEASE;  Surgeon: Tennis Must, MD;  Location: [facility];  Service: Orthopedics;  Laterality: Right;    Current Outpatient Prescriptions  Medication Sig Dispense Refill  . ALPRAZolam (XANAX) 1 MG tablet Take 1 tablet (1 mg total) by mouth at bedtime as needed for sleep.  30 tablet  5  . atorvastatin (LIPITOR) 40 MG tablet TAKE 1 TABLET BY MOUTH DAILY.  90 tablet  3  . losartan (COZAAR) 50 MG tablet Take 1 tablet (50 mg total) by mouth daily.  90 tablet  3  . metoprolol tartrate (LOPRESSOR) 25 MG tablet TAKE 1/2 TABLET (12.5 MG TOTAL) BY MOUTH DAILY.  30 tablet  3  . traMADol (ULTRAM) 50 MG tablet       . warfarin (COUMADIN) 5 MG tablet Take as directed by Anticoagulation clinic  45 tablet  2   No current facility-administered medications for this visit.    Allergies  Allergen Reactions  . Lisinopril Cough  . Niacin     contraindication    Review of Systems negative except from HPI and PMH  Physical Exam BP 119/77  Pulse 85  Ht 5\' 3"  (1.6 m)  Wt 191 lb 12.8 oz (87 kg)  BMI 33.98 kg/m2 Well developed and well nourished in no acute distress HENT normal E scleral and icterus clear Neck Supple JVP flat; carotids brisk and full Clear on R and decreased on left IrRegular rate and rhythm, no murmurs gallops or rub Soft with active bowel sounds No clubbing cyanosis none Edema Alert and oriented, grossly normal motor and sensory  function Skin Warm and Dry  ECG demonstrates atrial fibrillation 85  intervals-/10/34 Low-voltage  Assessment and  Plan  Atrial fibrillation-permanent  Dyspnea on exertion  Coronary artery disease with prior bypass/mitral valve repair  Carotid artery disease  Overall he is doing quite well. Heart rate seems to be relatively well-controlled with recorded rates in the low 100s at  Exercise.    We'll continue him on statin therapy.  He'll stay on warfarin.  A repeat carotid duplex  He will hold his losartan for a month to see if it resolves his cough.

## 2013-08-16 NOTE — Patient Instructions (Addendum)
Your physician recommends that you continue on your current medications as directed. Please refer to the Current Medication list given to you today.  Stop Losartan for one months to see if cough improves. Please call us once decision is reached about taking/not taking this medication -- [phone]  Your physician has requested that you have a carotid duplex. This test is an ultrasound of the carotid arteries in your neck. It looks at blood flow through these arteries that supply the brain with blood. Allow one hour for this exam. There are no restrictions or special instructions.    Your physician wants you to follow-up in: 1 year with Dr. Caryl Comes.  You will receive a reminder letter in the mail two months in advance. If you don't receive a letter, please call our office to schedule the follow-up appointment.

## 2013-08-17 DIAGNOSIS — M7500 Adhesive capsulitis of unspecified shoulder: Secondary | ICD-10-CM | POA: Diagnosis not present

## 2013-08-17 DIAGNOSIS — M67919 Unspecified disorder of synovium and tendon, unspecified shoulder: Secondary | ICD-10-CM | POA: Diagnosis not present

## 2013-08-17 DIAGNOSIS — M25519 Pain in unspecified shoulder: Secondary | ICD-10-CM | POA: Diagnosis not present

## 2013-08-25 DIAGNOSIS — M25519 Pain in unspecified shoulder: Secondary | ICD-10-CM | POA: Diagnosis not present

## 2013-08-25 DIAGNOSIS — M7500 Adhesive capsulitis of unspecified shoulder: Secondary | ICD-10-CM | POA: Diagnosis not present

## 2013-08-25 DIAGNOSIS — M719 Bursopathy, unspecified: Secondary | ICD-10-CM | POA: Diagnosis not present

## 2013-08-25 DIAGNOSIS — M67919 Unspecified disorder of synovium and tendon, unspecified shoulder: Secondary | ICD-10-CM | POA: Diagnosis not present

## 2013-08-31 ENCOUNTER — Encounter (HOSPITAL_COMMUNITY): Payer: Medicare Other

## 2013-08-31 DIAGNOSIS — M542 Cervicalgia: Secondary | ICD-10-CM | POA: Diagnosis not present

## 2013-08-31 DIAGNOSIS — M7500 Adhesive capsulitis of unspecified shoulder: Secondary | ICD-10-CM | POA: Diagnosis not present

## 2013-08-31 DIAGNOSIS — M5030 Other cervical disc degeneration, unspecified cervical region: Secondary | ICD-10-CM | POA: Diagnosis not present

## 2013-09-02 ENCOUNTER — Telehealth: Payer: Self-pay

## 2013-09-02 NOTE — Telephone Encounter (Signed)
Pt would like to know if Dr Sarajane Jews would increase ALPRAZolam (XANAX) 1 MG tablet to 45 pills instead of 30 .Marland Kitchen He takes 1 1/2 pill 3 to 4 times a week

## 2013-09-05 ENCOUNTER — Other Ambulatory Visit: Payer: Self-pay | Admitting: Family Medicine

## 2013-09-05 NOTE — Telephone Encounter (Signed)
Call in Xanax to take 2 tabs qhs, #60 with 5 rf

## 2013-09-06 ENCOUNTER — Ambulatory Visit (INDEPENDENT_AMBULATORY_CARE_PROVIDER_SITE_OTHER): Payer: Medicare Other

## 2013-09-06 DIAGNOSIS — I4892 Unspecified atrial flutter: Secondary | ICD-10-CM | POA: Diagnosis not present

## 2013-09-06 DIAGNOSIS — I639 Cerebral infarction, unspecified: Secondary | ICD-10-CM

## 2013-09-06 DIAGNOSIS — I6350 Cerebral infarction due to unspecified occlusion or stenosis of unspecified cerebral artery: Secondary | ICD-10-CM

## 2013-09-06 LAB — POCT INR: INR: 2

## 2013-09-06 MED ORDER — ALPRAZOLAM 1 MG PO TABS: 2.0000 mg | ORAL_TABLET | Freq: Every evening | ORAL | Status: DC | PRN

## 2013-09-06 NOTE — Telephone Encounter (Signed)
I called in script and spoke with pt. 

## 2013-09-07 DIAGNOSIS — M67919 Unspecified disorder of synovium and tendon, unspecified shoulder: Secondary | ICD-10-CM | POA: Diagnosis not present

## 2013-09-07 DIAGNOSIS — M719 Bursopathy, unspecified: Secondary | ICD-10-CM | POA: Diagnosis not present

## 2013-09-07 DIAGNOSIS — M25519 Pain in unspecified shoulder: Secondary | ICD-10-CM | POA: Diagnosis not present

## 2013-09-07 DIAGNOSIS — M7500 Adhesive capsulitis of unspecified shoulder: Secondary | ICD-10-CM | POA: Diagnosis not present

## 2013-09-13 DIAGNOSIS — M542 Cervicalgia: Secondary | ICD-10-CM | POA: Diagnosis not present

## 2013-09-14 ENCOUNTER — Telehealth: Payer: Self-pay | Admitting: Internal Medicine

## 2013-09-14 ENCOUNTER — Emergency Department (INDEPENDENT_AMBULATORY_CARE_PROVIDER_SITE_OTHER)
Admission: EM | Admit: 2013-09-14 | Discharge: 2013-09-14 | Disposition: A | Discharge status: Nursing Home (Includes ICF) | Payer: Medicare Other | Source: Home / Self Care | Attending: Family Medicine | Admitting: Family Medicine

## 2013-09-14 ENCOUNTER — Emergency Department (HOSPITAL_COMMUNITY)
Admission: EM | Admit: 2013-09-14 | Discharge: 2013-09-14 | Disposition: A | Discharge status: Home / Self Care | Payer: Medicare Other | Attending: Emergency Medicine | Admitting: Emergency Medicine

## 2013-09-14 ENCOUNTER — Encounter (HOSPITAL_COMMUNITY): Payer: Self-pay | Admitting: Emergency Medicine

## 2013-09-14 ENCOUNTER — Emergency Department (HOSPITAL_COMMUNITY): Payer: Medicare Other

## 2013-09-14 DIAGNOSIS — R0602 Shortness of breath: Secondary | ICD-10-CM

## 2013-09-14 DIAGNOSIS — I10 Essential (primary) hypertension: Secondary | ICD-10-CM | POA: Diagnosis not present

## 2013-09-14 DIAGNOSIS — F1310 Sedative, hypnotic or anxiolytic abuse, uncomplicated: Secondary | ICD-10-CM | POA: Insufficient documentation

## 2013-09-14 DIAGNOSIS — Z8719 Personal history of other diseases of the digestive system: Secondary | ICD-10-CM | POA: Insufficient documentation

## 2013-09-14 DIAGNOSIS — Z7901 Long term (current) use of anticoagulants: Secondary | ICD-10-CM | POA: Insufficient documentation

## 2013-09-14 DIAGNOSIS — I4892 Unspecified atrial flutter: Secondary | ICD-10-CM

## 2013-09-14 DIAGNOSIS — Z87891 Personal history of nicotine dependence: Secondary | ICD-10-CM | POA: Insufficient documentation

## 2013-09-14 DIAGNOSIS — G453 Amaurosis fugax: Secondary | ICD-10-CM

## 2013-09-14 DIAGNOSIS — Z860100 Personal history of colon polyps, unspecified: Secondary | ICD-10-CM | POA: Insufficient documentation

## 2013-09-14 DIAGNOSIS — H3400 Transient retinal artery occlusion, unspecified eye: Secondary | ICD-10-CM | POA: Diagnosis not present

## 2013-09-14 DIAGNOSIS — Z951 Presence of aortocoronary bypass graft: Secondary | ICD-10-CM | POA: Insufficient documentation

## 2013-09-14 DIAGNOSIS — Z79899 Other long term (current) drug therapy: Secondary | ICD-10-CM | POA: Insufficient documentation

## 2013-09-14 DIAGNOSIS — H5440 Blindness, one eye, unspecified eye: Secondary | ICD-10-CM | POA: Insufficient documentation

## 2013-09-14 DIAGNOSIS — I4891 Unspecified atrial fibrillation: Secondary | ICD-10-CM | POA: Insufficient documentation

## 2013-09-14 DIAGNOSIS — I2510 Atherosclerotic heart disease of native coronary artery without angina pectoris: Secondary | ICD-10-CM | POA: Insufficient documentation

## 2013-09-14 DIAGNOSIS — Z8673 Personal history of transient ischemic attack (TIA), and cerebral infarction without residual deficits: Secondary | ICD-10-CM | POA: Insufficient documentation

## 2013-09-14 DIAGNOSIS — E785 Hyperlipidemia, unspecified: Secondary | ICD-10-CM | POA: Insufficient documentation

## 2013-09-14 DIAGNOSIS — Z8739 Personal history of other diseases of the musculoskeletal system and connective tissue: Secondary | ICD-10-CM | POA: Insufficient documentation

## 2013-09-14 DIAGNOSIS — Z872 Personal history of diseases of the skin and subcutaneous tissue: Secondary | ICD-10-CM | POA: Insufficient documentation

## 2013-09-14 DIAGNOSIS — H53139 Sudden visual loss, unspecified eye: Secondary | ICD-10-CM | POA: Insufficient documentation

## 2013-09-14 DIAGNOSIS — H547 Unspecified visual loss: Secondary | ICD-10-CM | POA: Insufficient documentation

## 2013-09-14 DIAGNOSIS — H5460 Unqualified visual loss, one eye, unspecified: Secondary | ICD-10-CM | POA: Diagnosis not present

## 2013-09-14 DIAGNOSIS — Z8619 Personal history of other infectious and parasitic diseases: Secondary | ICD-10-CM | POA: Insufficient documentation

## 2013-09-14 DIAGNOSIS — Z8546 Personal history of malignant neoplasm of prostate: Secondary | ICD-10-CM | POA: Insufficient documentation

## 2013-09-14 DIAGNOSIS — Z862 Personal history of diseases of the blood and blood-forming organs and certain disorders involving the immune mechanism: Secondary | ICD-10-CM | POA: Insufficient documentation

## 2013-09-14 DIAGNOSIS — R002 Palpitations: Secondary | ICD-10-CM | POA: Insufficient documentation

## 2013-09-14 LAB — RAPID URINE DRUG SCREEN, HOSP PERFORMED
AMPHETAMINES: NOT DETECTED
BARBITURATES: NOT DETECTED
Benzodiazepines: POSITIVE — AB
Cocaine: NOT DETECTED
OPIATES: NOT DETECTED
TETRAHYDROCANNABINOL: NOT DETECTED

## 2013-09-14 LAB — URINALYSIS, ROUTINE W REFLEX MICROSCOPIC
Bilirubin Urine: NEGATIVE
Glucose, UA: NEGATIVE mg/dL
Hgb urine dipstick: NEGATIVE
Ketones, ur: NEGATIVE mg/dL
LEUKOCYTES UA: NEGATIVE
Nitrite: NEGATIVE
PROTEIN: NEGATIVE mg/dL
SPECIFIC GRAVITY, URINE: 1.014 (ref 1.005–1.030)
UROBILINOGEN UA: 0.2 mg/dL (ref 0.0–1.0)
pH: 6 (ref 5.0–8.0)

## 2013-09-14 LAB — DIFFERENTIAL
BASOS PCT: 0 % (ref 0–1)
Basophils Absolute: 0 10*3/uL (ref 0.0–0.1)
Eosinophils Absolute: 0.1 10*3/uL (ref 0.0–0.7)
Eosinophils Relative: 1 % (ref 0–5)
Lymphocytes Relative: 23 % (ref 12–46)
Lymphs Abs: 2.1 10*3/uL (ref 0.7–4.0)
MONOS PCT: 7 % (ref 3–12)
Monocytes Absolute: 0.6 10*3/uL (ref 0.1–1.0)
NEUTROS ABS: 6.3 10*3/uL (ref 1.7–7.7)
Neutrophils Relative %: 69 % (ref 43–77)

## 2013-09-14 LAB — PROTIME-INR
INR: 2.5 — AB (ref 0.00–1.49)
PROTHROMBIN TIME: 26.2 s — AB (ref 11.6–15.2)

## 2013-09-14 LAB — COMPREHENSIVE METABOLIC PANEL
ALK PHOS: 95 U/L (ref 39–117)
ALT: 23 U/L (ref 0–53)
AST: 25 U/L (ref 0–37)
Albumin: 3.6 g/dL (ref 3.5–5.2)
BILIRUBIN TOTAL: 0.5 mg/dL (ref 0.3–1.2)
BUN: 14 mg/dL (ref 6–23)
CHLORIDE: 103 meq/L (ref 96–112)
CO2: 27 mEq/L (ref 19–32)
Calcium: 9.6 mg/dL (ref 8.4–10.5)
Creatinine, Ser: 1.08 mg/dL (ref 0.50–1.35)
GFR calc Af Amer: 74 mL/min — ABNORMAL LOW (ref >90)
GFR calc non Af Amer: 64 mL/min — ABNORMAL LOW (ref >90)
Glucose, Bld: 111 mg/dL — ABNORMAL HIGH (ref 70–99)
Potassium: 4.5 mEq/L (ref 3.7–5.3)
SODIUM: 143 meq/L (ref 137–147)
Total Protein: 7 g/dL (ref 6.0–8.3)

## 2013-09-14 LAB — I-STAT TROPONIN, ED: Troponin i, poc: 0.03 ng/mL (ref 0.00–0.08)

## 2013-09-14 LAB — APTT: APTT: 44 s — AB (ref 24–37)

## 2013-09-14 LAB — CBC
HCT: 41.3 % (ref 39.0–52.0)
HEMOGLOBIN: 14.5 g/dL (ref 13.0–17.0)
MCH: 31.5 pg (ref 26.0–34.0)
MCHC: 35.1 g/dL (ref 30.0–36.0)
MCV: 89.6 fL (ref 78.0–100.0)
PLATELETS: 198 10*3/uL (ref 150–400)
RBC: 4.61 MIL/uL (ref 4.22–5.81)
RDW: 13.4 % (ref 11.5–15.5)
WBC: 9.1 10*3/uL (ref 4.0–10.5)

## 2013-09-14 LAB — ETHANOL: Alcohol, Ethyl (B): <11 mg/dL (ref 0–11)

## 2013-09-14 NOTE — ED Notes (Signed)
Pt c/o palpitations and lose of vision in the bottom half of right eye, lose of vision has resolved, no neuro deficits, equal grips, no facial droop, pt has hx of a-fib, on coumadin, hx of stroke in left eye and is blind in left eye, onset of lose of vision approximately 1700. Pt is A&Ox4, respirations equal and unlabored, skin warm and dry. Pt was already seen by UC today, sent to ED for further evaluation.

## 2013-09-14 NOTE — ED Provider Notes (Signed)
CSN: [id number]     Arrival date & time 09/14/13  1807 History   First MD Initiated Contact with Patient 09/14/13 1909     Chief Complaint  Patient presents with  . Irregular Heart Beat   (Consider location/radiation/quality/duration/timing/severity/associated sxs/prior Treatment) Patient is a 80 y.o. male presenting with neurologic complaint. The history is provided by the patient.  Neurologic Problem This is a new problem. The current episode started 3 to 5 hours ago. The problem has been gradually improving. Pertinent negatives include no chest pain, no abdominal pain and no headaches. Associated symptoms comments: Right eye visual loss temp this pm after feeling of rapid afib while working in yard today, , has h/o blindness left eye for cva and afib, is on coumadin..    Past Medical History  Diagnosis Date  . Chickenpox   . Blood transfusion abn reaction or complication, no procedure mishap   . History of colonic polyps     adenomatous  . Coronary artery disease-s/p CABG and MV annuloplasty   . Hyperlipidemia   . Hypertension   . Urinary incontinence   . Onychomycosis   . Central retinal artery occlusion--CVA and   . Atrial fibrillation // Atrial flutter   . History of blood clots   . GI bleed     after colonoscopy/polyp removal  . Anemia, posthemorrhagic, acute   . Prostate cancer   . 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 Insomnia   . Arthritis     Hands  . Blind left eye     from a cva   Past Surgical History  Procedure Laterality Date  . Mitral valve replacement  2007  . Radical prostatectomy with radiation  2003  . A flutter ablation  2010  . Bladder pump installed  2011    Dr. McDiarmid  . Colonoscopy w/ biopsies and polypectomy  12/17/2010, 12/24/2010    adenomatous polyps and one focal carcinoma in situ, external hemorrhoids, repeat colonoscopy to control post-polypectomy bleed  . Inguinal hernia repair  1993    bilatera  . Colonoscopy  06/23/2011    Procedure:  COLONOSCOPY;  Surgeon: Gatha Mayer, MD;  Location: [facility];  Service: Endoscopy;  Laterality: N/A;  . Mitral valve ring placement      Per pt he did not have a mitral valve replacement  . Coronary artery bypass graft  1989    4 vessels  . Coronary artery bypass graft  2007    and mitral valve replacement   . Eye surgery      Catarat with Lens Bil  . Cancer of right thumb nail    . Total knee arthroplasty  08/27/2011    Procedure: TOTAL KNEE ARTHROPLASTY;  Surgeon: Ninetta Lights, MD;  Location: [place];  Service: Orthopedics;  Laterality: Left;  90 MINUTES FOR SURGERY  . Carpal tunnel release Right 03/01/2013    Procedure: RIGHT CARPAL TUNNEL RELEASE;  Surgeon: Tennis Must, MD;  Location: [place];  Service: Orthopedics;  Laterality: Right;   Family History  Problem Relation Age of Onset  . Alcohol abuse Mother   . Hypertension Mother   . Stroke Mother   . Diabetes Mother   . Coronary artery disease Father   . Cancer Daughter     renal cell cancer  . Colon cancer Neg Hx   . Esophageal cancer Neg Hx   . Stomach cancer Neg Hx   . Anesthesia problems Neg Hx    History  Substance Use Topics  . Smoking status: Former Smoker -- 10 years    Quit date: 06/09/1977  . Smokeless tobacco: Never Used  . Alcohol Use: No    Review of Systems  Constitutional: Negative.   HENT: Negative.   Eyes: Positive for visual disturbance.  Respiratory: Negative.   Cardiovascular: Positive for palpitations. Negative for chest pain.  Gastrointestinal: Negative for abdominal pain.  Neurological: Negative for dizziness, weakness and headaches.    Allergies  Lisinopril and Niacin  Home Medications   Current Outpatient Rx  Name  Route  Sig  Dispense  Refill  . ALPRAZolam (XANAX) 1 MG tablet   Oral   Take 2 tablets (2 mg total) by mouth at bedtime as needed for sleep.   60 tablet   5   . atorvastatin (LIPITOR) 40 MG tablet      TAKE 1 TABLET BY MOUTH DAILY.   90  tablet   3   . losartan (COZAAR) 50 MG tablet   Oral   Take 1 tablet (50 mg total) by mouth daily.   90 tablet   3   . metoprolol tartrate (LOPRESSOR) 25 MG tablet      TAKE 1/2 TABLET (12.5 MG TOTAL) BY MOUTH DAILY.   30 tablet   3   . traMADol (ULTRAM) 50 MG tablet               . warfarin (COUMADIN) 5 MG tablet      Take as directed by Anticoagulation clinic   45 tablet   2    BP 136/85  Pulse 96  Temp(Src) 97.8 F (36.6 C) (Oral)  Resp 21  SpO2 94% Physical Exam  Nursing note and vitals reviewed. Constitutional: He appears well-developed and well-nourished. He appears distressed.  Eyes: Pupils are equal, round, and reactive to light.  Neck: Normal range of motion. Neck supple.  Cardiovascular: Regular rhythm, normal heart sounds and intact distal pulses.   Pulmonary/Chest: Effort normal and breath sounds normal.  Lymphadenopathy:    He has no cervical adenopathy.  Skin: Skin is warm and dry.    ED Course  Procedures (including critical care time) Labs Review Labs Reviewed - No data to display Imaging Review No results found.   MDM  No diagnosis found. Sent for inr and eval of amaurosis fugax,    Billy Fischer, MD 09/14/13 [phone]

## 2013-09-14 NOTE — ED Provider Notes (Signed)
CSN: [id number]     Arrival date & time 09/14/13  1930 History   First MD Initiated Contact with Patient 09/14/13 2048     Chief Complaint  Patient presents with  . Loss of Vision  . Palpitations     (Consider location/radiation/quality/duration/timing/severity/associated sxs/prior Treatment) HPI Comments: Pt also has a fib and states he had palpiatations during this time but has resolved. Dneies chest pain,S OB, abd pain, n/v/d.    No sxs currently.  Patient is a 80 y.o. male presenting with eye problem. The history is provided by the patient.  Eye Problem Location:  R eye Quality: vision loss. Severity:  Severe Onset quality:  Sudden Duration:  5 minutes Timing:  Constant Progression:  Resolved Chronicity:  New Context comment:  Pt states was doing yard work and suddenly los tht ebottom half of R eye vison. No change in L. NO pain, no numbness, no weaknes, no HA, no dizziness Relieved by: time, resolved in 5 minutes. Worsened by:  Nothing tried Ineffective treatments:  None tried Associated symptoms: decreased vision   Associated symptoms: no blurred vision, no discharge, no double vision, no foreign body sensation, no headaches, no itching, no nausea, no numbness, no photophobia, no redness, no swelling, no tearing, no vomiting and no weakness   Risk factors comment:  Hx of CRAO and CVA making him blind in L   Past Medical History  Diagnosis Date  . Chickenpox   . Blood transfusion abn reaction or complication, no procedure mishap   . History of colonic polyps     adenomatous  . Coronary artery disease-s/p CABG and MV annuloplasty   . Hyperlipidemia   . Hypertension   . Urinary incontinence   . Onychomycosis   . Central retinal artery occlusion--CVA and   . Atrial fibrillation // Atrial flutter   . History of blood clots   . GI bleed     after colonoscopy/polyp removal  . Anemia, posthemorrhagic, acute   . Prostate cancer   . 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 Insomnia   . Arthritis     Hands  . Blind left eye     from a cva   Past Surgical History  Procedure Laterality Date  . Mitral valve replacement  2007  . Radical prostatectomy with radiation  2003  . A flutter ablation  2010  . Bladder pump installed  2011    Dr. McDiarmid  . Colonoscopy w/ biopsies and polypectomy  12/17/2010, 12/24/2010    adenomatous polyps and one focal carcinoma in situ, external hemorrhoids, repeat colonoscopy to control post-polypectomy bleed  . Inguinal hernia repair  1993    bilatera  . Colonoscopy  06/23/2011    Procedure: COLONOSCOPY;  Surgeon: Gatha Mayer, MD;  Location: [facility];  Service: Endoscopy;  Laterality: N/A;  . Mitral valve ring placement      Per pt he did not have a mitral valve replacement  . Coronary artery bypass graft  1989    4 vessels  . Coronary artery bypass graft  2007    and mitral valve replacement   . Eye surgery      Catarat with Lens Bil  . Cancer of right thumb nail    . Total knee arthroplasty  08/27/2011    Procedure: TOTAL KNEE ARTHROPLASTY;  Surgeon: Ninetta Lights, MD;  Location: [facility];  Service: Orthopedics;  Laterality: Left;  90 MINUTES FOR SURGERY  . Carpal tunnel release Right 03/01/2013  Procedure: RIGHT CARPAL TUNNEL RELEASE;  Surgeon: Tennis Must, MD;  Location: [place] [facility];  Service: Orthopedics;  Laterality: Right;   Family History  Problem Relation Age of Onset  . Alcohol abuse Mother   . Hypertension Mother   . Stroke Mother   . Diabetes Mother   . Coronary artery disease Father   . Cancer Daughter     renal cell cancer  . Colon cancer Neg Hx   . Esophageal cancer Neg Hx   . Stomach cancer Neg Hx   . Anesthesia problems Neg Hx    History  Substance Use Topics  . Smoking status: Former Smoker -- 10 years    Quit date: 06/09/1977  . Smokeless tobacco: Never Used  . Alcohol Use: No    Review of Systems  Constitutional: Negative for fever, activity change and appetite  change.  HENT: Negative for congestion and rhinorrhea.   Eyes: Positive for visual disturbance. Negative for blurred vision, double vision, photophobia, discharge, redness and itching.  Respiratory: Negative for cough, shortness of breath and wheezing.   Cardiovascular: Positive for palpitations. Negative for chest pain.  Gastrointestinal: Negative for nausea, vomiting, abdominal pain, diarrhea and constipation.  Genitourinary: Negative for hematuria, decreased urine volume and difficulty urinating.  Skin: Negative for rash and wound.  Neurological: Negative for syncope, weakness, numbness and headaches.  All other systems reviewed and are negative.     Allergies  Lisinopril and Niacin  Home Medications   Current Outpatient Rx  Name  Route  Sig  Dispense  Refill  . ALPRAZolam (XANAX) 1 MG tablet   Oral   Take 2 tablets (2 mg total) by mouth at bedtime as needed for sleep.   60 tablet   5   . atorvastatin (LIPITOR) 40 MG tablet      TAKE 1 TABLET BY MOUTH DAILY.   90 tablet   3   . losartan (COZAAR) 50 MG tablet   Oral   Take 1 tablet (50 mg total) by mouth daily.   90 tablet   3   . metoprolol tartrate (LOPRESSOR) 25 MG tablet      TAKE 1/2 TABLET (12.5 MG TOTAL) BY MOUTH DAILY.   30 tablet   3   . traMADol (ULTRAM) 50 MG tablet   Oral   Take 50 mg by mouth 2 (two) times daily.          Marland Kitchen warfarin (COUMADIN) 5 MG tablet   Oral   Take 5 mg by mouth daily. Take 7.5 on Mon, Wed, Sat, and Sun. All other days take 5mg .          BP 147/82  Pulse 69  Temp(Src) 97.6 F (36.4 C) (Oral)  Resp 20  Ht 5\' 7"  (1.702 m)  Wt 182 lb (82.555 kg)  BMI 28.50 kg/m2  SpO2 97% Physical Exam  Vitals reviewed. Constitutional: He is oriented to person, place, and time. He appears well-developed and well-nourished. No distress.  HENT:  Head: Normocephalic and atraumatic.  Mouth/Throat: Oropharynx is clear and moist. No oropharyngeal exudate.  Eyes: Conjunctivae and EOM  are normal. Pupils are equal, round, and reactive to light. Right eye exhibits no discharge. Left eye exhibits no discharge. No scleral icterus.  PERRLA EOMs intact. No visual field deficits on R eye. L eye cannot discriminate fingers, which is baseline Attempted fundoscopic but limited as patient's eye not dilated  Neck: Normal range of motion. Neck supple.  Cardiovascular: Normal rate, normal heart sounds  and intact distal pulses.  Exam reveals no gallop and no friction rub.   No murmur heard. irreg irreg  Pulmonary/Chest: Effort normal and breath sounds normal. No respiratory distress. He has no wheezes. He has no rales.  Abdominal: Soft. He exhibits no distension and no mass. There is no tenderness.  Musculoskeletal: Normal range of motion.  Neurological: He is alert and oriented to person, place, and time. He has normal reflexes. No cranial nerve deficit. He exhibits normal muscle tone. Coordination normal.  5/5 strength in all exts, noraml sensation in all exts, CNs III-XII intact, 2+ DTrs in patella and brachioradilias b/l, F2N negative, ambulatory without ataxia  Skin: Skin is warm. No rash noted. He is not diaphoretic.    ED Course  Procedures (including critical care time) Labs Review Labs Reviewed  PROTIME-INR - Abnormal; Notable for the following:    Prothrombin Time 26.2 (*)    INR 2.50 (*)    All other components within normal limits  APTT - Abnormal; Notable for the following:    aPTT 44 (*)    All other components within normal limits  COMPREHENSIVE METABOLIC PANEL - Abnormal; Notable for the following:    Glucose, Bld 111 (*)    GFR calc non Af Amer 64 (*)    GFR calc Af Amer 74 (*)    All other components within normal limits  URINE RAPID DRUG SCREEN (HOSP PERFORMED) - Abnormal; Notable for the following:    Benzodiazepines POSITIVE (*)    All other components within normal limits  ETHANOL  CBC  DIFFERENTIAL  URINALYSIS, ROUTINE W REFLEX MICROSCOPIC  I-STAT  TROPOININ, ED  I-STAT TROPOININ, ED   Imaging Review Ct Head Wo Contrast  09/14/2013   CLINICAL DATA:  Vision loss right eye  EXAM: CT HEAD WITHOUT CONTRAST  TECHNIQUE: Contiguous axial images were obtained from the base of the skull through the vertex without intravenous contrast.  COMPARISON:  None.  FINDINGS: No mass. No hydrocephalus. No hemorrhage. No acute intracranial abnormality identified. No acute bony abnormality identified.  IMPRESSION: No acute abnormality.   Electronically Signed   By: Marcello Moores  Register   On: 09/14/2013 21:08     EKG Interpretation   Date/Time:  Wednesday September 14 2013 19:56:32 EDT Ventricular Rate:  74 PR Interval:    QRS Duration: 96 QT Interval:  360 QTC Calculation: 399 R Axis:   -25 Text Interpretation:  Atrial fibrillation Abnormal ECG Confirmed by  Christy Gentles  MD, Elenore Rota ([id number]) on 09/14/2013 9:44:07 PM      MDM   MDM: 80 y.o. WM w/ PMHx of HLD, HTN, Afib on Coumadin, hx of CVA and CRAO w/ cc: of vision loss for 5 minutes. Blind in L eye from previous CVA. R eye with lower half diminished for 5 minutes. Also had some palpiations. No other sxs. Lasted 5 minutes than resolved. Now no sxs. Called his ophtho who wanted to see him tomorrow, then called Cardiologist who recommend he come to ED. Pt has annual dopplers of carotid which have been stable for past two years (0-39%). AFVSS. No acute neuro deficits. No current sxs. Per triage, labs and Ct ordered, WNL with no acute lab abnormalities. Coumadin therapeutic. Pt requesting to go home after negative labs. D/w Dr. Armida Sans of Neurology who agrees with plan and states likely TIA, amaurosis. Patient has opthalmologist, and agrees with close f/u tomorrow. States patient should get dopplers, but can get as outpatient. D/w patient who has appt with ophtho tomorrow, and  states his dopplers are scheudled for Monday. Suitable for discharge, return to ED if any new sxs. Discharged. Care of case d/w my attending.  Final  diagnoses:  Amaurosis fugax, right eye    Discharged    Sol Passer, MD 09/15/13 [phone]

## 2013-09-14 NOTE — Telephone Encounter (Signed)
Pt explains that he thinks he is out of rhythm again, and lost vision in rt eye for about 5 minutes this morning. He says it was a "milky color vision" not "black vision like the last stroke". He also c/o lightheadedness at the time, but none at this time. Advised him to go to ER, but he does not want to do that. I spoke with Dr. Caryl Comes, who is going to call the patient to discuss this matter.

## 2013-09-14 NOTE — ED Provider Notes (Signed)
Pt presents after transient visual loss that has since resolved.  He denies any other focal weakness He is awake/alert, no distress He is requesting d/c home.    Sharyon Cable, MD 09/14/13 2145

## 2013-09-14 NOTE — ED Notes (Signed)
Patient transported to CT 

## 2013-09-14 NOTE — Telephone Encounter (Signed)
New message     Patient felt like his heart went out of rhythum today.  He lost vision out of his rt eye but it came back.  Pt is due to have a carotid doppler Monday. Pt realized he was talking to the operator and became angry and started yelling.  I put him on hold and transferred him to triage

## 2013-09-14 NOTE — ED Notes (Signed)
Hx. Atrial fib, stroke ( blind L eye), on Warfarin.  Was working outside and felt like his heart got out of rhythm and felt tired @ 1400.  He rested felt better in about 30 min. and felt better.  Lost 1/2 of his lower vision in his R eye @ 1700 and lasted 5 min.  He talked to his eye doctor and he told him to talk to his heart doctor.  Dr. Caryl Comes wants his INR checked now.  He is scheduled for Carotid artery check on Mon. and is going to see eye doctor and Dr. Caryl Comes tomorrow.  No symptoms now.

## 2013-09-14 NOTE — Discharge Instructions (Signed)
Amaurosis Fugax Amaurosis fugax is a condition in which a person loses sight in one eye. The loss of vision in the affected eye may be total or partial. It usually lasts just a few seconds or minutes. Then, it returns to normal. Occasionally, it may last for several hours. This is caused by interruption of blood flow to the artery that supplies blood to the retina (lining at the back of the eye, contains nerves needed for sight). The temporary loss of blood flow causes symptoms similar to a stroke. The family of symptoms that happen from a loss of blood flow is called a Transient Ischemic Attack (TIA, mini-stroke). In the case of amaurosis fugax, the eye is the organ that is involved. SYMPTOMS   Painless, sudden loss of vision in one eye.  Visual loss is often from the top down, appearing like a curtain being pulled down over the field of vision.  Rapid return of vision. Vision generally comes back in a few minutes to several hours. CAUSES  TIAs and amaurosis fugax are caused by a loss of blood flow. This can be due to a buildup of cholesterol and fats (plaque) in the arteries or the heart. If some of that plaque comes off the artery and gets into the bloodstream, it can flow to the artery that supplies blood to the retina, blocking the flow of blood to the retina. When that happens, vision is lost for as long as the blood flow is interrupted. Factors that make it more likely you will have amaurosis fugax at some point include:  Smoking.  Poorly controlled diabetes.  High blood pressure.  High cholesterol levels. Medical conditions that may increase the risk of an attack of amaurosis fugax include:  Heart disease.  Diseases of the heart valves.  Certain diseases of the blood (sickle cell anemia, leukemia).  Blood clotting (coagulation) disorders.  Artery inflammation (temporal arteritis, giant cell arteritis). Since amaurosis fugax is an "incomplete stroke," in some people it can be a  sign of an increased risk for an actual stroke. A stroke can result in permanent vision loss or loss of other body functions. As a result, caring for yourself after amaurosis fugax means taking many of the same steps you should take to prevent a stroke. HOME CARE INSTRUCTIONS   Only take over-the-counter or prescription medicines for pain, discomfort, or fever as directed by your caregiver.  Take any medicines that are prescribed for control of your blood pressure and cholesterol levels.  Keep diabetes under control as well as possible.  Stop smoking.  Follow diet instructions, if your caregiver has given them to you.  Try to get at least 30 minutes of moderate physical activity every day. If you have not been active, talk to your caregiver about how to get started. SEEK IMMEDIATE MEDICAL CARE IF:   You lose vision in one or both eyes again, even if only for a short period of time.  You lose vision in one eye and it does not recover within a very brief time (less than 5-10 minutes). The sooner you see an eye specialist (ophthalmologist), the better the chance of regaining some vision, in the case of a central retinal artery occlusion (CRAO, blockage of central retinal artery). However, most cases of CRAO result in some degree of permanent visual loss, even with aggressive treatment.  You have symptoms of a stroke:  Weakness in one side of your body.  Difficulty speaking or thinking clearly.  Lack of coordination. Document  Released: 03/04/2008 Document Revised: 08/18/2011 Document Reviewed: 03/04/2008 [facility] Patient Information 2014 [place], [place].

## 2013-09-15 ENCOUNTER — Telehealth: Payer: Self-pay | Admitting: Family Medicine

## 2013-09-15 DIAGNOSIS — H3400 Transient retinal artery occlusion, unspecified eye: Secondary | ICD-10-CM | POA: Diagnosis not present

## 2013-09-15 NOTE — Telephone Encounter (Signed)
Spoke with pt who reports he spoke with Dr. Caryl Comes yesterday before going to ED. He went to ED and was diagnosed with Amaurosis Fugax. Vision has since returned to normal. He is seeing eye doctor today.  He has appt for carotid dopplers on September 19, 2013. He would like to see Dr. Caryl Comes when here for this appt.  I told pt I would check with Dr. Caryl Comes to see if he can be added to his schedule as there are no openings at this time.  Pt states it is OK to leave appt time on his voicemail if he cannot answer phone.

## 2013-09-15 NOTE — ED Provider Notes (Signed)
I have personally seen and examined the patient.  I have discussed the plan of care with the resident.  I have reviewed the documentation on PMH/FH/Soc. History.  I have reviewed the documentation of the resident and agree.   Sharyon Cable, MD 09/15/13 2020

## 2013-09-15 NOTE — Telephone Encounter (Signed)
Spoke with Dr.Klein and he can see pt at 8:30 on September 19, 2013. Per Dr.Klein pt does not need CVRR appt on September 19, 2013 as INR was OK yesterday in ED.  I reviewed with pharmacist in [facility] and appt changed to Oct 12, 2013 at 10:00.  I left this information on pt's identified voicemail. Left message to call office if questions.

## 2013-09-15 NOTE — Telephone Encounter (Signed)
New message    Patient calling stating he wanted to speak with Dr. Caryl Comes nurse Sherri advise patient nurse is off today. Patient stated he does not want message to go Dr. [place].    patient calling stated he had a CVA on yesterday . Talk with Dr. Caryl Comes on yesterday wanted to know what his INR was. Patient was  rude on the phone when trying to explain situation to him. Advise patient forwarding message to triage nurse,.

## 2013-09-15 NOTE — Telephone Encounter (Signed)
I spoke with pt and he is going to see cardiology on Monday 09/19/13, he is going to follow up with Dr. Sarajane Jews on Friday 09/23/13.

## 2013-09-15 NOTE — Telephone Encounter (Signed)
Pt called back and states he saw eye doctor today who was asking about his INR. I told pt I had discussed with Dr. Caryl Comes as outlined below.  Pt aware to continue coumadin as on his med list. He is aware of appts.

## 2013-09-19 ENCOUNTER — Ambulatory Visit (HOSPITAL_COMMUNITY): Payer: Medicare Other | Attending: Cardiovascular Disease | Admitting: *Deleted

## 2013-09-19 ENCOUNTER — Encounter: Payer: Self-pay | Admitting: Internal Medicine

## 2013-09-19 ENCOUNTER — Other Ambulatory Visit: Payer: Self-pay | Admitting: Internal Medicine

## 2013-09-19 ENCOUNTER — Ambulatory Visit (INDEPENDENT_AMBULATORY_CARE_PROVIDER_SITE_OTHER): Payer: Medicare Other | Admitting: Internal Medicine

## 2013-09-19 ENCOUNTER — Ambulatory Visit (INDEPENDENT_AMBULATORY_CARE_PROVIDER_SITE_OTHER): Payer: Medicare Other | Admitting: Pharmacist

## 2013-09-19 VITALS — BP 131/76 | HR 56 | Ht 67.5 in | Wt 192.0 lb

## 2013-09-19 DIAGNOSIS — Z8673 Personal history of transient ischemic attack (TIA), and cerebral infarction without residual deficits: Secondary | ICD-10-CM | POA: Diagnosis not present

## 2013-09-19 DIAGNOSIS — I6350 Cerebral infarction due to unspecified occlusion or stenosis of unspecified cerebral artery: Secondary | ICD-10-CM

## 2013-09-19 DIAGNOSIS — I6529 Occlusion and stenosis of unspecified carotid artery: Secondary | ICD-10-CM

## 2013-09-19 DIAGNOSIS — G459 Transient cerebral ischemic attack, unspecified: Secondary | ICD-10-CM

## 2013-09-19 DIAGNOSIS — I4892 Unspecified atrial flutter: Secondary | ICD-10-CM | POA: Diagnosis not present

## 2013-09-19 DIAGNOSIS — I639 Cerebral infarction, unspecified: Secondary | ICD-10-CM

## 2013-09-19 DIAGNOSIS — I4891 Unspecified atrial fibrillation: Secondary | ICD-10-CM

## 2013-09-19 LAB — POCT INR: INR: 3.9

## 2013-09-19 NOTE — Patient Instructions (Signed)
Your physician recommends that you continue on your current medications as directed. Please refer to the Current Medication list given to you today.  Your physician wants you to follow-up in: 3-4 months with Dr. Caryl Comes. You will receive a reminder letter in the mail two months in advance. If you don't receive a letter, please call our office to schedule the follow-up appointment.

## 2013-09-19 NOTE — Progress Notes (Signed)
Patient Care Team: Laurey Morale, MD as PCP - General   HPI  [name] is a 80 y.o. male seen in followup of atrial flutter and a recent stroke manifesting as central retinal artery occlusion. 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 This was along the horizontal meridian. He went to see his eye doctor. It was his opinion that this was an embolic TIA.  And His INR that day was 2.5   His target INR is 2.5--3.5.   He has a history of bypass surgery and mitral valve repair in 2007. LVEF by echo 2010 Normal  The patient denies chest pain, shortness of breath, nocturnal dyspnea, orthopnea or peripheral edema. There have been no palpitations, lightheadedness or syncope.    Past Medical History  Diagnosis Date  . Chickenpox   . Blood transfusion abn reaction or complication, no procedure mishap   . History of colonic polyps     adenomatous  . Coronary artery disease-s/p CABG and MV annuloplasty   . Hyperlipidemia   . Hypertension   . Urinary incontinence   . Onychomycosis   . Central retinal artery occlusion--CVA and   . Atrial fibrillation // Atrial flutter   . History of blood clots   . GI bleed     after colonoscopy/polyp removal  . Anemia, posthemorrhagic, acute   . Prostate cancer   . 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 Insomnia   . Arthritis     Hands  . 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 Mitral valve replacement  2007  . Radical prostatectomy with radiation  2003  . A flutter ablation  2010  . Bladder pump installed  2011    Dr. McDiarmid  . Colonoscopy w/ biopsies and polypectomy  12/17/2010, 12/24/2010    adenomatous polyps and one focal carcinoma in situ, external hemorrhoids, repeat colonoscopy to control  post-polypectomy bleed  . Inguinal hernia repair  1993    bilatera  . Colonoscopy  06/23/2011    Procedure: COLONOSCOPY;  Surgeon: Gatha Mayer, MD;  Location: [facility];  Service: Endoscopy;  Laterality: N/A;  . Mitral valve ring placement      Per pt he did not have a mitral valve replacement  . Coronary artery bypass graft  1989    4 vessels  . Coronary artery bypass graft  2007    and mitral valve replacement   . Eye surgery      Catarat with Lens Bil  . Cancer of right thumb nail    . Total knee arthroplasty  08/27/2011    Procedure: TOTAL KNEE ARTHROPLASTY;  Surgeon: Ninetta Lights, MD;  Location: [facility];  Service: Orthopedics;  Laterality: Left;  90 MINUTES FOR SURGERY  . Carpal tunnel release Right 03/01/2013    Procedure: RIGHT CARPAL TUNNEL RELEASE;  Surgeon: Tennis Must, MD;  Location: [facility];  Service: Orthopedics;  Laterality: Right;    Current Outpatient Prescriptions  Medication Sig Dispense Refill  . ALPRAZolam (XANAX) 1 MG tablet Take 2 tablets (2 mg total) by mouth at bedtime as needed for sleep.  60 tablet  5  . atorvastatin (LIPITOR) 40 MG tablet TAKE 1 TABLET BY MOUTH  DAILY.  90 tablet  3  . losartan (COZAAR) 50 MG tablet Take 1 tablet (50 mg total) by mouth daily.  90 tablet  3  . metoprolol tartrate (LOPRESSOR) 25 MG tablet TAKE 1/2 TABLET (12.5 MG TOTAL) BY MOUTH DAILY.  30 tablet  3  . traMADol (ULTRAM) 50 MG tablet Take 50 mg by mouth 2 (two) times daily.       Marland Kitchen warfarin (COUMADIN) 5 MG tablet Take 5 mg by mouth daily. Take 7.5 on Mon, Wed, Sat, and Sun. All other days take 5mg .       No current facility-administered medications for this visit.    Allergies  Allergen Reactions  . Lisinopril Cough  . Niacin     contraindication    Review of Systems negative except from HPI and PMH  Physical Exam BP 131/76  Pulse 56  Ht 5' 7.5" (1.715 m)  Wt 192 lb (87.091 kg)  BMI 29.61 kg/m2 Well developed and well nourished in no acute  distress HENT normal E scleral and icterus clear Neck Supple JVP flat; carotids brisk and full Clear on R and decreased on left IrRegular rate and rhythm, no murmurs gallops or rub Soft with active bowel sounds No clubbing cyanosis none Edema Alert and oriented, grossly normal motor and sensory function Skin Warm and Dry  ECG demonstrates atrial fibrillation 59 intervals-/10/34 Low-voltage  Assessment and  Plan  Atrial fibrillation-permanent  Dyspnea on exertion  Cough  Coronary artery disease with prior bypass/mitral valve repair  Carotid artery disease  Overall he is doing quite well. Heart rate seems to be relatively well-controlled with recorded rates in the low 100s at  Exercise.    We'll continue him on statin therapy.  He'll stay on warfarin we will change his target INR from 2.5-3.5>>> 3.0--3.5.  A repeat carotid duplex is pending  Cough is better still not resolved

## 2013-09-19 NOTE — Progress Notes (Signed)
Carotid duplex complete 

## 2013-09-20 DIAGNOSIS — M719 Bursopathy, unspecified: Secondary | ICD-10-CM | POA: Diagnosis not present

## 2013-09-20 DIAGNOSIS — M25519 Pain in unspecified shoulder: Secondary | ICD-10-CM | POA: Diagnosis not present

## 2013-09-20 DIAGNOSIS — M67919 Unspecified disorder of synovium and tendon, unspecified shoulder: Secondary | ICD-10-CM | POA: Diagnosis not present

## 2013-09-20 DIAGNOSIS — M7500 Adhesive capsulitis of unspecified shoulder: Secondary | ICD-10-CM | POA: Diagnosis not present

## 2013-09-20 DIAGNOSIS — M542 Cervicalgia: Secondary | ICD-10-CM | POA: Diagnosis not present

## 2013-09-22 ENCOUNTER — Telehealth: Payer: Self-pay | Admitting: *Deleted

## 2013-09-22 NOTE — Telephone Encounter (Signed)
Pt called stating had been placed on Methocarbamol for muscle spasms and wanted to know if any  Interaction between methocarbamol and coumadin and pt instructed no interaction and pt states understanding

## 2013-09-23 ENCOUNTER — Ambulatory Visit (INDEPENDENT_AMBULATORY_CARE_PROVIDER_SITE_OTHER): Payer: Medicare Other | Admitting: Family Medicine

## 2013-09-23 ENCOUNTER — Encounter: Payer: Self-pay | Admitting: Family Medicine

## 2013-09-23 VITALS — BP 136/74 | HR 101 | Temp 98.2°F | Ht 67.5 in | Wt 188.0 lb

## 2013-09-23 DIAGNOSIS — I4892 Unspecified atrial flutter: Secondary | ICD-10-CM | POA: Diagnosis not present

## 2013-09-23 DIAGNOSIS — I10 Essential (primary) hypertension: Secondary | ICD-10-CM

## 2013-09-23 DIAGNOSIS — I639 Cerebral infarction, unspecified: Secondary | ICD-10-CM

## 2013-09-23 DIAGNOSIS — M25519 Pain in unspecified shoulder: Secondary | ICD-10-CM | POA: Diagnosis not present

## 2013-09-23 DIAGNOSIS — H3400 Transient retinal artery occlusion, unspecified eye: Secondary | ICD-10-CM | POA: Diagnosis not present

## 2013-09-23 DIAGNOSIS — I2510 Atherosclerotic heart disease of native coronary artery without angina pectoris: Secondary | ICD-10-CM | POA: Diagnosis not present

## 2013-09-23 DIAGNOSIS — G453 Amaurosis fugax: Secondary | ICD-10-CM | POA: Insufficient documentation

## 2013-09-23 DIAGNOSIS — M542 Cervicalgia: Secondary | ICD-10-CM | POA: Diagnosis not present

## 2013-09-23 DIAGNOSIS — I6350 Cerebral infarction due to unspecified occlusion or stenosis of unspecified cerebral artery: Secondary | ICD-10-CM

## 2013-09-23 DIAGNOSIS — M7500 Adhesive capsulitis of unspecified shoulder: Secondary | ICD-10-CM | POA: Diagnosis not present

## 2013-09-23 MED ORDER — METHOCARBAMOL 500 MG PO TABS: 500.0000 mg | ORAL_TABLET | Freq: Four times a day (QID) | ORAL | Status: AC | PRN

## 2013-09-23 NOTE — Progress Notes (Signed)
   Subjective:    Patient ID: [name], male    DOB: March 29, 1934, 80 y.o.   MRN: [id number]  HPI Here asking for advice. On 09-14-13 he went to the ER with an episode of amaurosis fugax. He experienced classic loss of half of his right visual field for about 5 minutes and then this went away. No other sx. Of course he has lost vision in the left eye due to a stroke. He saw his ophthalmologist, Dr. Prudencio Burly, earlier this week and his eye was fine. He had carotid dopplers which showed stable plaque buildups of 1-39% bilaterally. He saw Dr. Caryl Comes this week who has tightened up on his Coumadin target a bit. Dr. Caryl Comes has been researching for alternative medications [name] can take to mange the atrial fib, and he mentioned some experimental procedures that are being looked at to ablate excitable cardiac tissues. [name] asks if I think he should look into some of these things. He feels fine today.   Review of Systems  Constitutional: Negative.   Respiratory: Negative.   Cardiovascular: Negative.   Neurological: Negative.        Objective:   Physical Exam  Constitutional: He is oriented to person, place, and time. He appears well-developed and well-nourished.  Cardiovascular: Normal rate, regular rhythm, normal heart sounds and intact distal pulses.   Pulmonary/Chest: Effort normal and breath sounds normal.  Neurological: He is alert and oriented to person, place, and time.          Assessment & Plan:  I encouraged him to look into some of these procedures since we are already anticoagulating him to the best of our ability and yet he is still having embolic events. He will follow up with Dr. Caryl Comes.

## 2013-09-23 NOTE — Progress Notes (Signed)
Pre visit review using our clinic review tool, if applicable. No additional management support is needed unless otherwise documented below in the visit note. 

## 2013-09-26 ENCOUNTER — Ambulatory Visit (INDEPENDENT_AMBULATORY_CARE_PROVIDER_SITE_OTHER): Payer: Medicare Other | Admitting: Pharmacist

## 2013-09-26 ENCOUNTER — Telehealth: Payer: Self-pay | Admitting: Family Medicine

## 2013-09-26 DIAGNOSIS — I6350 Cerebral infarction due to unspecified occlusion or stenosis of unspecified cerebral artery: Secondary | ICD-10-CM

## 2013-09-26 DIAGNOSIS — I639 Cerebral infarction, unspecified: Secondary | ICD-10-CM

## 2013-09-26 DIAGNOSIS — I4892 Unspecified atrial flutter: Secondary | ICD-10-CM

## 2013-09-26 LAB — POCT INR: INR: 2

## 2013-09-26 NOTE — Telephone Encounter (Signed)
Relevant patient education assigned to patient using Emmi. ° °

## 2013-09-27 DIAGNOSIS — M7500 Adhesive capsulitis of unspecified shoulder: Secondary | ICD-10-CM | POA: Diagnosis not present

## 2013-09-27 DIAGNOSIS — M542 Cervicalgia: Secondary | ICD-10-CM | POA: Diagnosis not present

## 2013-09-27 DIAGNOSIS — M719 Bursopathy, unspecified: Secondary | ICD-10-CM | POA: Diagnosis not present

## 2013-09-27 DIAGNOSIS — M67919 Unspecified disorder of synovium and tendon, unspecified shoulder: Secondary | ICD-10-CM | POA: Diagnosis not present

## 2013-09-27 DIAGNOSIS — M25519 Pain in unspecified shoulder: Secondary | ICD-10-CM | POA: Diagnosis not present

## 2013-09-28 ENCOUNTER — Telehealth: Payer: Self-pay | Admitting: Family Medicine

## 2013-09-28 NOTE — Telephone Encounter (Signed)
Seen PCP recently for follow up on stroke.  However, pt is requesting referral to someone for anger issues he is experiencing since stroke.  Pt advised PCP is out of office this afternoon.

## 2013-09-29 DIAGNOSIS — M25519 Pain in unspecified shoulder: Secondary | ICD-10-CM | POA: Diagnosis not present

## 2013-09-29 DIAGNOSIS — M542 Cervicalgia: Secondary | ICD-10-CM | POA: Diagnosis not present

## 2013-09-29 DIAGNOSIS — M67919 Unspecified disorder of synovium and tendon, unspecified shoulder: Secondary | ICD-10-CM | POA: Diagnosis not present

## 2013-09-29 DIAGNOSIS — M7500 Adhesive capsulitis of unspecified shoulder: Secondary | ICD-10-CM | POA: Diagnosis not present

## 2013-09-29 NOTE — Telephone Encounter (Signed)
No referral is necessary. He can make his own appt. I suggest calling the [facility] office at [phone].

## 2013-09-29 NOTE — Telephone Encounter (Signed)
Pt following up on request for referral for anger management and depression. Pt states this is also a family issue and wants to get the whole family. Pt would like to get this going today if possible.

## 2013-09-30 NOTE — Telephone Encounter (Signed)
Pt returning call to check status of this request, informed him of PCP's instructions.  Pt to call [facility] for an appt.

## 2013-10-04 ENCOUNTER — Ambulatory Visit (INDEPENDENT_AMBULATORY_CARE_PROVIDER_SITE_OTHER): Payer: Medicare Other | Admitting: Licensed Clinical Social Worker

## 2013-10-04 DIAGNOSIS — M7500 Adhesive capsulitis of unspecified shoulder: Secondary | ICD-10-CM | POA: Diagnosis not present

## 2013-10-04 DIAGNOSIS — M719 Bursopathy, unspecified: Secondary | ICD-10-CM | POA: Diagnosis not present

## 2013-10-04 DIAGNOSIS — F331 Major depressive disorder, recurrent, moderate: Secondary | ICD-10-CM | POA: Diagnosis not present

## 2013-10-04 DIAGNOSIS — M67919 Unspecified disorder of synovium and tendon, unspecified shoulder: Secondary | ICD-10-CM | POA: Diagnosis not present

## 2013-10-04 DIAGNOSIS — F411 Generalized anxiety disorder: Secondary | ICD-10-CM | POA: Diagnosis not present

## 2013-10-04 DIAGNOSIS — M542 Cervicalgia: Secondary | ICD-10-CM | POA: Diagnosis not present

## 2013-10-04 DIAGNOSIS — M25519 Pain in unspecified shoulder: Secondary | ICD-10-CM | POA: Diagnosis not present

## 2013-10-06 ENCOUNTER — Ambulatory Visit (INDEPENDENT_AMBULATORY_CARE_PROVIDER_SITE_OTHER): Payer: Medicare Other

## 2013-10-06 DIAGNOSIS — M25519 Pain in unspecified shoulder: Secondary | ICD-10-CM | POA: Diagnosis not present

## 2013-10-06 DIAGNOSIS — M719 Bursopathy, unspecified: Secondary | ICD-10-CM | POA: Diagnosis not present

## 2013-10-06 DIAGNOSIS — I6350 Cerebral infarction due to unspecified occlusion or stenosis of unspecified cerebral artery: Secondary | ICD-10-CM | POA: Diagnosis not present

## 2013-10-06 DIAGNOSIS — I639 Cerebral infarction, unspecified: Secondary | ICD-10-CM

## 2013-10-06 DIAGNOSIS — M542 Cervicalgia: Secondary | ICD-10-CM | POA: Diagnosis not present

## 2013-10-06 DIAGNOSIS — M67919 Unspecified disorder of synovium and tendon, unspecified shoulder: Secondary | ICD-10-CM | POA: Diagnosis not present

## 2013-10-06 DIAGNOSIS — M7500 Adhesive capsulitis of unspecified shoulder: Secondary | ICD-10-CM | POA: Diagnosis not present

## 2013-10-06 DIAGNOSIS — I4892 Unspecified atrial flutter: Secondary | ICD-10-CM | POA: Diagnosis not present

## 2013-10-06 LAB — POCT INR: INR: 4

## 2013-10-07 NOTE — Telephone Encounter (Signed)
error 

## 2013-10-10 ENCOUNTER — Ambulatory Visit (INDEPENDENT_AMBULATORY_CARE_PROVIDER_SITE_OTHER): Payer: Medicare Other | Admitting: Licensed Clinical Social Worker

## 2013-10-10 DIAGNOSIS — F411 Generalized anxiety disorder: Secondary | ICD-10-CM | POA: Diagnosis not present

## 2013-10-10 DIAGNOSIS — F331 Major depressive disorder, recurrent, moderate: Secondary | ICD-10-CM | POA: Diagnosis not present

## 2013-10-11 DIAGNOSIS — M67919 Unspecified disorder of synovium and tendon, unspecified shoulder: Secondary | ICD-10-CM | POA: Diagnosis not present

## 2013-10-11 DIAGNOSIS — M542 Cervicalgia: Secondary | ICD-10-CM | POA: Diagnosis not present

## 2013-10-11 DIAGNOSIS — M7500 Adhesive capsulitis of unspecified shoulder: Secondary | ICD-10-CM | POA: Diagnosis not present

## 2013-10-11 DIAGNOSIS — M25519 Pain in unspecified shoulder: Secondary | ICD-10-CM | POA: Diagnosis not present

## 2013-10-11 DIAGNOSIS — M719 Bursopathy, unspecified: Secondary | ICD-10-CM | POA: Diagnosis not present

## 2013-10-14 ENCOUNTER — Telehealth: Payer: Self-pay | Admitting: Family Medicine

## 2013-10-14 MED ORDER — PAROXETINE HCL 20 MG PO TABS: 20.0000 mg | ORAL_TABLET | Freq: Every day | ORAL | Status: DC

## 2013-10-14 NOTE — Telephone Encounter (Signed)
Pt saw susan bond on may 4 and was told she would get w/ dr fry to rx a med for anxiety. Pt has not heard anything and waiting everyday for a script. pls advise. Pt would like today if possible. Pt states he needs something asap. [facility]/ [place]

## 2013-10-14 NOTE — Telephone Encounter (Signed)
I sent script e-scribe and spoke with pt. 

## 2013-10-14 NOTE — Telephone Encounter (Signed)
Call in Paxil 20 mg daily, #30 with 2 rf and have him see me in 2 weeks to talk about it

## 2013-10-17 ENCOUNTER — Ambulatory Visit (INDEPENDENT_AMBULATORY_CARE_PROVIDER_SITE_OTHER): Payer: Medicare Other

## 2013-10-17 DIAGNOSIS — I4892 Unspecified atrial flutter: Secondary | ICD-10-CM

## 2013-10-17 DIAGNOSIS — I639 Cerebral infarction, unspecified: Secondary | ICD-10-CM

## 2013-10-17 DIAGNOSIS — I6350 Cerebral infarction due to unspecified occlusion or stenosis of unspecified cerebral artery: Secondary | ICD-10-CM | POA: Diagnosis not present

## 2013-10-17 LAB — POCT INR: INR: 4

## 2013-10-17 NOTE — Progress Notes (Signed)
Was there a change made from prior dosing since INR was 4?

## 2013-10-24 ENCOUNTER — Ambulatory Visit (INDEPENDENT_AMBULATORY_CARE_PROVIDER_SITE_OTHER): Payer: Medicare Other | Admitting: Licensed Clinical Social Worker

## 2013-10-24 DIAGNOSIS — F411 Generalized anxiety disorder: Secondary | ICD-10-CM | POA: Diagnosis not present

## 2013-10-24 DIAGNOSIS — F331 Major depressive disorder, recurrent, moderate: Secondary | ICD-10-CM | POA: Diagnosis not present

## 2013-10-28 ENCOUNTER — Encounter: Payer: Self-pay | Admitting: Family Medicine

## 2013-10-28 ENCOUNTER — Ambulatory Visit (INDEPENDENT_AMBULATORY_CARE_PROVIDER_SITE_OTHER): Payer: Medicare Other | Admitting: Family Medicine

## 2013-10-28 VITALS — BP 139/75 | HR 62 | Temp 97.8°F | Ht 67.5 in | Wt 190.0 lb

## 2013-10-28 DIAGNOSIS — F341 Dysthymic disorder: Secondary | ICD-10-CM | POA: Diagnosis not present

## 2013-10-28 DIAGNOSIS — G453 Amaurosis fugax: Secondary | ICD-10-CM

## 2013-10-28 DIAGNOSIS — I639 Cerebral infarction, unspecified: Secondary | ICD-10-CM

## 2013-10-28 DIAGNOSIS — I2510 Atherosclerotic heart disease of native coronary artery without angina pectoris: Secondary | ICD-10-CM

## 2013-10-28 DIAGNOSIS — I10 Essential (primary) hypertension: Secondary | ICD-10-CM

## 2013-10-28 DIAGNOSIS — I4892 Unspecified atrial flutter: Secondary | ICD-10-CM

## 2013-10-28 DIAGNOSIS — Z7901 Long term (current) use of anticoagulants: Secondary | ICD-10-CM | POA: Diagnosis not present

## 2013-10-28 DIAGNOSIS — I6350 Cerebral infarction due to unspecified occlusion or stenosis of unspecified cerebral artery: Secondary | ICD-10-CM | POA: Diagnosis not present

## 2013-10-28 DIAGNOSIS — H3400 Transient retinal artery occlusion, unspecified eye: Secondary | ICD-10-CM

## 2013-10-28 DIAGNOSIS — F418 Other specified anxiety disorders: Secondary | ICD-10-CM | POA: Insufficient documentation

## 2013-10-28 NOTE — Progress Notes (Signed)
   Subjective:    Patient ID: [name], male    DOB: 07-17-1933, 80 y.o.   MRN: [id number]  HPI Here to follow up on depression and anxiety. At our last visit he discussed his high stress levels and how they were affecting him. I referred him to Richardo Priest for therapy, and he has been very pleased with this so far. She suggested he take medication as well so we started him on Paxil. This has worked very well for him. He feels more calm and is happier. He is more patient and does not lose his temper so quickly when talking to his wife and other family members.    Review of Systems  Constitutional: Negative.   Psychiatric/Behavioral: Negative for confusion, sleep disturbance, dysphoric mood, decreased concentration and agitation. The patient is not nervous/anxious.        Objective:   Physical Exam  Constitutional: He appears well-developed and well-nourished.  Psychiatric: He has a normal mood and affect. His behavior is normal. Thought content normal.          Assessment & Plan:  We spoke for 45 minutes and he seems to be doing very well. Stay on current meds and he will see [place] in one month.

## 2013-10-28 NOTE — Progress Notes (Signed)
Pre visit review using our clinic review tool, if applicable. No additional management support is needed unless otherwise documented below in the visit note. 

## 2013-11-01 ENCOUNTER — Ambulatory Visit (INDEPENDENT_AMBULATORY_CARE_PROVIDER_SITE_OTHER): Payer: Medicare Other | Admitting: Pharmacist

## 2013-11-01 DIAGNOSIS — I639 Cerebral infarction, unspecified: Secondary | ICD-10-CM

## 2013-11-01 DIAGNOSIS — I6350 Cerebral infarction due to unspecified occlusion or stenosis of unspecified cerebral artery: Secondary | ICD-10-CM | POA: Diagnosis not present

## 2013-11-01 DIAGNOSIS — I4892 Unspecified atrial flutter: Secondary | ICD-10-CM

## 2013-11-01 LAB — POCT INR: INR: 3.1

## 2013-11-14 ENCOUNTER — Ambulatory Visit (INDEPENDENT_AMBULATORY_CARE_PROVIDER_SITE_OTHER): Payer: Medicare Other | Admitting: Licensed Clinical Social Worker

## 2013-11-14 DIAGNOSIS — F331 Major depressive disorder, recurrent, moderate: Secondary | ICD-10-CM | POA: Diagnosis not present

## 2013-11-14 DIAGNOSIS — F411 Generalized anxiety disorder: Secondary | ICD-10-CM

## 2013-11-15 ENCOUNTER — Ambulatory Visit: Payer: Medicare Other | Admitting: Licensed Clinical Social Worker

## 2013-11-18 ENCOUNTER — Telehealth: Payer: Self-pay | Admitting: Internal Medicine

## 2013-11-18 NOTE — Telephone Encounter (Signed)
Pt took double dose yesterday, instructed to skip today's dose and then resume normal regimen. Keep appt for Monday.

## 2013-11-18 NOTE — Telephone Encounter (Signed)
New message     Pt states he has taken too much warfarin.  Please call

## 2013-11-21 ENCOUNTER — Ambulatory Visit (INDEPENDENT_AMBULATORY_CARE_PROVIDER_SITE_OTHER): Payer: Medicare Other | Admitting: *Deleted

## 2013-11-21 ENCOUNTER — Ambulatory Visit (INDEPENDENT_AMBULATORY_CARE_PROVIDER_SITE_OTHER): Payer: Medicare Other | Admitting: Family Medicine

## 2013-11-21 ENCOUNTER — Encounter: Payer: Self-pay | Admitting: Family Medicine

## 2013-11-21 VITALS — BP 128/80 | HR 68 | Temp 98.4°F | Wt 141.5 lb

## 2013-11-21 DIAGNOSIS — I2510 Atherosclerotic heart disease of native coronary artery without angina pectoris: Secondary | ICD-10-CM

## 2013-11-21 DIAGNOSIS — I639 Cerebral infarction, unspecified: Secondary | ICD-10-CM

## 2013-11-21 DIAGNOSIS — F418 Other specified anxiety disorders: Secondary | ICD-10-CM

## 2013-11-21 DIAGNOSIS — I6350 Cerebral infarction due to unspecified occlusion or stenosis of unspecified cerebral artery: Secondary | ICD-10-CM

## 2013-11-21 DIAGNOSIS — F341 Dysthymic disorder: Secondary | ICD-10-CM

## 2013-11-21 DIAGNOSIS — I4892 Unspecified atrial flutter: Secondary | ICD-10-CM | POA: Diagnosis not present

## 2013-11-21 LAB — POCT INR: INR: 3.3

## 2013-11-21 NOTE — Progress Notes (Signed)
   Subjective:    Patient ID: [name], male    DOB: June 12, 1933, 80 y.o.   MRN: [id number]  HPI Here to follow up depression and anxiety. He is taking Paxil 20 mg a day and he is doing well. He relates a few things that happened recently with his daughter that made him very angry but he says he handled them better than he would have before he took medication. He still sees Richardo Priest.    Review of Systems  Constitutional: Negative.   Respiratory: Negative.   Cardiovascular: Negative.   Skin: Negative.   Psychiatric/Behavioral: Negative.        Objective:   Physical Exam  Constitutional: He is oriented to person, place, and time. He appears well-developed and well-nourished.  Neurological: He is alert and oriented to person, place, and time.  Psychiatric: He has a normal mood and affect. His behavior is normal. Judgment and thought content normal.          Assessment & Plan:  He is doing well. Recheck in 3 months

## 2013-11-21 NOTE — Progress Notes (Signed)
Pre visit review using our clinic review tool, if applicable. No additional management support is needed unless otherwise documented below in the visit note. 

## 2013-12-12 ENCOUNTER — Ambulatory Visit (INDEPENDENT_AMBULATORY_CARE_PROVIDER_SITE_OTHER): Payer: Medicare Other

## 2013-12-12 DIAGNOSIS — I6350 Cerebral infarction due to unspecified occlusion or stenosis of unspecified cerebral artery: Secondary | ICD-10-CM

## 2013-12-12 DIAGNOSIS — I639 Cerebral infarction, unspecified: Secondary | ICD-10-CM

## 2013-12-12 DIAGNOSIS — I4892 Unspecified atrial flutter: Secondary | ICD-10-CM

## 2013-12-12 LAB — POCT INR: INR: 4

## 2013-12-20 ENCOUNTER — Ambulatory Visit (INDEPENDENT_AMBULATORY_CARE_PROVIDER_SITE_OTHER): Payer: Medicare Other | Admitting: Internal Medicine

## 2013-12-20 ENCOUNTER — Encounter: Payer: Self-pay | Admitting: Internal Medicine

## 2013-12-20 VITALS — BP 148/78 | HR 88 | Ht 67.0 in | Wt 192.0 lb

## 2013-12-20 DIAGNOSIS — I2510 Atherosclerotic heart disease of native coronary artery without angina pectoris: Secondary | ICD-10-CM | POA: Diagnosis not present

## 2013-12-20 DIAGNOSIS — I4891 Unspecified atrial fibrillation: Secondary | ICD-10-CM

## 2013-12-20 DIAGNOSIS — I482 Chronic atrial fibrillation, unspecified: Secondary | ICD-10-CM

## 2013-12-20 MED ORDER — LOSARTAN POTASSIUM-HCTZ 50-12.5 MG PO TABS: 1.0000 | ORAL_TABLET | Freq: Every day | ORAL | Status: DC

## 2013-12-20 NOTE — Patient Instructions (Signed)
Your physician has recommended you make the following change in your medication:  1) STOP Losartan 2) START Hyzaar 50/12.5 mg daily  Your physician recommends that you return for lab work in: 2-3 weeks for BMET follow up after medication change.  Your physician wants you to follow-up in: 6 months with Dr. Caryl Comes. You will receive a reminder letter in the mail two months in advance. If you don't receive a letter, please call our office to schedule the follow-up appointment.

## 2013-12-20 NOTE — Progress Notes (Signed)
Patient Care Team: Laurey Morale, MD as PCP - General   HPI  [name] is a 80 y.o. male seen in followup of atrial flutter and a recent stroke manifesting as central retinal artery occlusion. 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 This was along the horizontal meridian. He went to see his eye doctor. It was his opinion that this was an embolic TIA.  And His INR that day was 2.5   His target INR is 2.5--3.5.   He has a history of bypass surgery and mitral valve repair in 2007. Myoview 8/14 demonstrated an ejection fraction 52%. There is a large evidence of scar and inferolateral wall.  The patient denies chest pain, shortness of breath, nocturnal dyspnea, orthopnea or peripheral edema. There have been no palpitations, lightheadedness or syncope.    Past Medical History  Diagnosis Date  . Chickenpox   . Blood transfusion abn reaction or complication, no procedure mishap   . History of colonic polyps     adenomatous  . Coronary artery disease-s/p CABG and MV annuloplasty   . Hyperlipidemia   . Hypertension   . Urinary incontinence   . Onychomycosis   . Central retinal artery occlusion--CVA and   . Atrial fibrillation // Atrial flutter   . History of blood clots   . GI bleed     after colonoscopy/polyp removal  . Anemia, posthemorrhagic, acute   . Prostate cancer   . 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 Insomnia   . Arthritis     Hands  . 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 Depression     Past Surgical History  Procedure Laterality Date  . Mitral valve replacement  2007  . Radical prostatectomy with radiation  2003  . A flutter ablation  2010  . Bladder pump installed  2011    Dr. McDiarmid  . Colonoscopy w/ biopsies and polypectomy  12/17/2010, 12/24/2010    adenomatous polyps  and one focal carcinoma in situ, external hemorrhoids, repeat colonoscopy to control post-polypectomy bleed  . Inguinal hernia repair  1993    bilatera  . Colonoscopy  06/23/2011    Procedure: COLONOSCOPY;  Surgeon: Gatha Mayer, MD;  Location: [facility];  Service: Endoscopy;  Laterality: N/A;  . Mitral valve ring placement      Per pt he did not have a mitral valve replacement  . Coronary artery bypass graft  1989    4 vessels  . Coronary artery bypass graft  2007    and mitral valve replacement   . Eye surgery      Catarat with Lens Bil  . Cancer of right thumb nail    . Total knee arthroplasty  08/27/2011    Procedure: TOTAL KNEE ARTHROPLASTY;  Surgeon: Ninetta Lights, MD;  Location: [facility];  Service: Orthopedics;  Laterality: Left;  90 MINUTES FOR SURGERY  . Carpal tunnel release Right 03/01/2013    Procedure: RIGHT CARPAL TUNNEL RELEASE;  Surgeon: Tennis Must, MD;  Location: [facility];  Service: Orthopedics;  Laterality: Right;    Current Outpatient Prescriptions  Medication Sig Dispense Refill  . ALPRAZolam (XANAX) 1 MG tablet Take 2 tablets (2 mg total) by mouth at bedtime as needed for sleep.  60 tablet  5  . atorvastatin (LIPITOR) 40 MG tablet TAKE 1 TABLET BY MOUTH DAILY.  90 tablet  3  . losartan (COZAAR) 50 MG tablet Take 1 tablet (50 mg total) by mouth daily.  90 tablet  3  . methocarbamol (ROBAXIN) 500 MG tablet Take 1 tablet (500 mg total) by mouth every 6 (six) hours as needed for muscle spasms.  60 tablet  11  . metoprolol tartrate (LOPRESSOR) 25 MG tablet TAKE 1/2 TABLET (12.5 MG TOTAL) BY MOUTH DAILY.  30 tablet  3  . PARoxetine (PAXIL) 20 MG tablet Take 1 tablet (20 mg total) by mouth daily.  30 tablet  2  . traMADol (ULTRAM) 50 MG tablet Take 50 mg by mouth 2 (two) times daily as needed.       . warfarin (COUMADIN) 5 MG tablet 1.5 tablets every day except 1 tablet on Sunday, Tuesday and Thursday or as directed by coumadin clinic  45 tablet  3    No current facility-administered medications for this visit.    Allergies  Allergen Reactions  . Lisinopril Cough  . Niacin     contraindication    Review of Systems negative except from HPI and PMH  Physical Exam BP 148/78  Pulse 88  Ht 5\' 7"  (1.702 m)  Wt 192 lb (87.091 kg)  BMI 30.06 kg/m2 Well developed and well nourished in no acute distress HENT normal E scleral and icterus clear Neck Supple JVP flat; carotids brisk and full Clear on R and decreased on left IrRegular rate and rhythm, no murmurs gallops or rub Soft with active bowel sounds No clubbing cyanosis none Edema Alert and oriented, grossly normal motor and sensory function Skin Warm and Dry  ECG demonstrates atrial fibrillation 59 intervals-/10/34 Low-voltage  Assessment and  Plan  Atrial fibrillation-permanent  Dyspnea on exertion  Coronary artery disease with prior bypass/mitral valve repair; 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 We discussed extensively the physiology of HFpEF. Initially we will try low-dose diuretics. We'll add hydrochlorothiazide 25 mg. Will anticipate a metabolic profile and couple weeks. We also discussed the possibility of using stress testing and/or Holter monitoring to clarify his heart rate as there seems to be an abnormal response to exercise as measured by his FitBit  We'll continue him on statin therapy.  He'll stay on warfarin we will change his target INR from 2.5-3.5>>> 3.0--3.5.     Cough is better still not resolved

## 2014-01-03 ENCOUNTER — Ambulatory Visit (INDEPENDENT_AMBULATORY_CARE_PROVIDER_SITE_OTHER): Payer: Medicare Other

## 2014-01-03 ENCOUNTER — Other Ambulatory Visit (INDEPENDENT_AMBULATORY_CARE_PROVIDER_SITE_OTHER): Payer: Medicare Other

## 2014-01-03 DIAGNOSIS — I6350 Cerebral infarction due to unspecified occlusion or stenosis of unspecified cerebral artery: Secondary | ICD-10-CM

## 2014-01-03 DIAGNOSIS — I639 Cerebral infarction, unspecified: Secondary | ICD-10-CM

## 2014-01-03 DIAGNOSIS — I4892 Unspecified atrial flutter: Secondary | ICD-10-CM | POA: Diagnosis not present

## 2014-01-03 LAB — POCT INR: INR: 4

## 2014-01-04 ENCOUNTER — Ambulatory Visit (INDEPENDENT_AMBULATORY_CARE_PROVIDER_SITE_OTHER): Payer: Medicare Other | Admitting: *Deleted

## 2014-01-04 DIAGNOSIS — I4892 Unspecified atrial flutter: Secondary | ICD-10-CM | POA: Diagnosis not present

## 2014-01-04 DIAGNOSIS — I10 Essential (primary) hypertension: Secondary | ICD-10-CM | POA: Diagnosis not present

## 2014-01-04 LAB — PROTIME-INR
INR: 3.9 ratio — ABNORMAL HIGH (ref 0.8–1.0)
Prothrombin Time: 41.4 s — ABNORMAL HIGH (ref 9.6–13.1)

## 2014-01-17 ENCOUNTER — Ambulatory Visit (INDEPENDENT_AMBULATORY_CARE_PROVIDER_SITE_OTHER): Payer: Medicare Other | Admitting: *Deleted

## 2014-01-17 DIAGNOSIS — I6350 Cerebral infarction due to unspecified occlusion or stenosis of unspecified cerebral artery: Secondary | ICD-10-CM

## 2014-01-17 DIAGNOSIS — I639 Cerebral infarction, unspecified: Secondary | ICD-10-CM

## 2014-01-17 LAB — POCT INR: INR: 3.3

## 2014-01-20 ENCOUNTER — Other Ambulatory Visit: Payer: Self-pay | Admitting: Family Medicine

## 2014-01-26 DIAGNOSIS — Z961 Presence of intraocular lens: Secondary | ICD-10-CM | POA: Diagnosis not present

## 2014-01-26 DIAGNOSIS — H3400 Transient retinal artery occlusion, unspecified eye: Secondary | ICD-10-CM | POA: Diagnosis not present

## 2014-01-26 DIAGNOSIS — H349 Unspecified retinal vascular occlusion: Secondary | ICD-10-CM | POA: Diagnosis not present

## 2014-02-10 ENCOUNTER — Other Ambulatory Visit: Payer: Self-pay | Admitting: Internal Medicine

## 2014-02-14 ENCOUNTER — Ambulatory Visit (INDEPENDENT_AMBULATORY_CARE_PROVIDER_SITE_OTHER): Payer: Medicare Other | Admitting: *Deleted

## 2014-02-14 DIAGNOSIS — I639 Cerebral infarction, unspecified: Secondary | ICD-10-CM

## 2014-02-14 DIAGNOSIS — I6350 Cerebral infarction due to unspecified occlusion or stenosis of unspecified cerebral artery: Secondary | ICD-10-CM | POA: Diagnosis not present

## 2014-02-14 LAB — POCT INR: INR: 2.9

## 2014-03-09 ENCOUNTER — Other Ambulatory Visit: Payer: Self-pay | Admitting: Internal Medicine

## 2014-03-09 ENCOUNTER — Other Ambulatory Visit: Payer: Self-pay | Admitting: Family Medicine

## 2014-03-09 NOTE — Telephone Encounter (Signed)
Call in #60 with 5 rf 

## 2014-03-10 ENCOUNTER — Other Ambulatory Visit: Payer: Self-pay | Admitting: *Deleted

## 2014-03-15 ENCOUNTER — Ambulatory Visit (INDEPENDENT_AMBULATORY_CARE_PROVIDER_SITE_OTHER): Payer: Medicare Other | Admitting: Pharmacist

## 2014-03-15 DIAGNOSIS — I4892 Unspecified atrial flutter: Secondary | ICD-10-CM | POA: Diagnosis not present

## 2014-03-15 DIAGNOSIS — I639 Cerebral infarction, unspecified: Secondary | ICD-10-CM | POA: Diagnosis not present

## 2014-03-15 LAB — POCT INR: INR: 2.9

## 2014-04-05 DIAGNOSIS — M4806 Spinal stenosis, lumbar region: Secondary | ICD-10-CM | POA: Diagnosis not present

## 2014-04-05 DIAGNOSIS — M25552 Pain in left hip: Secondary | ICD-10-CM | POA: Diagnosis not present

## 2014-04-05 DIAGNOSIS — M25551 Pain in right hip: Secondary | ICD-10-CM | POA: Diagnosis not present

## 2014-04-14 ENCOUNTER — Ambulatory Visit (INDEPENDENT_AMBULATORY_CARE_PROVIDER_SITE_OTHER): Payer: Medicare Other | Admitting: *Deleted

## 2014-04-14 DIAGNOSIS — I4892 Unspecified atrial flutter: Secondary | ICD-10-CM

## 2014-04-14 DIAGNOSIS — I639 Cerebral infarction, unspecified: Secondary | ICD-10-CM | POA: Diagnosis not present

## 2014-04-14 LAB — POCT INR: INR: 5.9

## 2014-04-17 DIAGNOSIS — M47816 Spondylosis without myelopathy or radiculopathy, lumbar region: Secondary | ICD-10-CM | POA: Diagnosis not present

## 2014-04-25 ENCOUNTER — Telehealth: Payer: Self-pay | Admitting: Family Medicine

## 2014-04-25 NOTE — Telephone Encounter (Signed)
Pt has a bit of confusion re: the medications he should be taking. Dr Caryl Comes gave him a med that caused him to have a lot of phlegm and Dr Sarajane Jews changed it. The Dr Caryl Comes changed him to another med and he is having phlegm again. He sees Dr Caryl Comes tomorrow and wants to know what he was taking that did not cause the phlegm. Ok To leave a message if you do not reach him.

## 2014-04-25 NOTE — Telephone Encounter (Signed)
I left a message with this information

## 2014-04-25 NOTE — Telephone Encounter (Signed)
I am sorry but I have no idea what he is talking about. I cannot find any mention of this in my notes. If could give me the names of these medications I could help him more

## 2014-04-26 ENCOUNTER — Encounter: Payer: Self-pay | Admitting: Internal Medicine

## 2014-04-26 ENCOUNTER — Ambulatory Visit (INDEPENDENT_AMBULATORY_CARE_PROVIDER_SITE_OTHER): Payer: Medicare Other | Admitting: Internal Medicine

## 2014-04-26 VITALS — BP 100/62 | HR 77 | Ht 67.0 in | Wt 194.0 lb

## 2014-04-26 DIAGNOSIS — I5030 Unspecified diastolic (congestive) heart failure: Secondary | ICD-10-CM

## 2014-04-26 DIAGNOSIS — R059 Cough, unspecified: Secondary | ICD-10-CM

## 2014-04-26 DIAGNOSIS — I509 Heart failure, unspecified: Secondary | ICD-10-CM

## 2014-04-26 DIAGNOSIS — R05 Cough: Secondary | ICD-10-CM

## 2014-04-26 DIAGNOSIS — I482 Chronic atrial fibrillation, unspecified: Secondary | ICD-10-CM

## 2014-04-26 DIAGNOSIS — I2510 Atherosclerotic heart disease of native coronary artery without angina pectoris: Secondary | ICD-10-CM

## 2014-04-26 DIAGNOSIS — Z01810 Encounter for preprocedural cardiovascular examination: Secondary | ICD-10-CM

## 2014-04-26 DIAGNOSIS — I25810 Atherosclerosis of coronary artery bypass graft(s) without angina pectoris: Secondary | ICD-10-CM | POA: Diagnosis not present

## 2014-04-26 MED ORDER — CHLORTHALIDONE 25 MG PO TABS: 25.0000 mg | ORAL_TABLET | Freq: Every day | ORAL | Status: DC

## 2014-04-26 NOTE — Patient Instructions (Addendum)
Your physician has recommended you make the following change in your medication:  1) STOP Losartan-hydrochlorothiazide 2) START Chlorthalidone 25 mg daily  Your physician recommends that you return for lab work in: 2 weeks for BMET  Your physician wants you to follow-up in: May 2016 with Dr. Caryl Comes.  You will receive a reminder letter in the mail two months in advance. If you don't receive a letter, please call our office to schedule the follow-up appointment.

## 2014-04-26 NOTE — Progress Notes (Signed)
Patient Care Team: Laurey Morale, MD as PCP - General   HPI  [name] is a 80 y.o. male seen in followup of atrial flutter and a recent stroke manifesting as central retinal artery occlusion. 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 His INR that day was 2.5   His target INR is 2.5--3.5.  Shortness of breath improved with the addition of a diuretic.  He continues to have a dry noxious cough.  He has a history of bypass surgery and mitral valve repair in 2007. Myoview 8/14 demonstrated an ejection fraction 52%. There is a large evidence of scar and inferolateral wall.    Past Medical History  Diagnosis Date  . Chickenpox   . Blood transfusion abn reaction or complication, no procedure mishap   . History of colonic polyps     adenomatous  . Coronary artery disease-s/p CABG and MV annuloplasty   . Hyperlipidemia   . Hypertension   . Urinary incontinence   . Onychomycosis   . Central retinal artery occlusion--CVA and   . Atrial fibrillation // Atrial flutter   . History of blood clots   . GI bleed     after colonoscopy/polyp removal  . Anemia, posthemorrhagic, acute   . Prostate cancer   . 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 Insomnia   . Arthritis     Hands  . 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 Depression     Past Surgical History  Procedure Laterality Date  . Mitral valve replacement  2007  . Radical prostatectomy with radiation  2003  . A flutter ablation  2010  . Bladder pump installed  2011    Dr. McDiarmid  . Colonoscopy w/ biopsies and polypectomy  12/17/2010, 12/24/2010    adenomatous polyps and one focal carcinoma in situ, external hemorrhoids, repeat colonoscopy to control post-polypectomy bleed  . Inguinal hernia  repair  1993    bilatera  . Colonoscopy  06/23/2011    Procedure: COLONOSCOPY;  Surgeon: Gatha Mayer, MD;  Location: [facility];  Service: Endoscopy;  Laterality: N/A;  . Mitral valve ring placement      Per pt he did not have a mitral valve replacement  . Coronary artery bypass graft  1989    4 vessels  . Coronary artery bypass graft  2007    and mitral valve replacement   . Eye surgery      Catarat with Lens Bil  . Cancer of right thumb nail    . Total knee arthroplasty  08/27/2011    Procedure: TOTAL KNEE ARTHROPLASTY;  Surgeon: Ninetta Lights, MD;  Location: [place];  Service: Orthopedics;  Laterality: Left;  90 MINUTES FOR SURGERY  . Carpal tunnel release Right 03/01/2013    Procedure: RIGHT CARPAL TUNNEL RELEASE;  Surgeon: Tennis Must, MD;  Location: [place];  Service: Orthopedics;  Laterality: Right;    Current Outpatient Prescriptions  Medication Sig Dispense Refill  . ALPRAZolam (XANAX) 1 MG tablet TAKE 2 TABLETS BY MOUTH AT BEDTIME AS NEEDED. 60 tablet 5  . atorvastatin (LIPITOR) 40 MG tablet TAKE 1 TABLET BY MOUTH DAILY. 90 tablet 3  . losartan-hydrochlorothiazide (HYZAAR) 50-12.5 MG per  tablet Take 1 tablet by mouth daily. 30 tablet 5  . methocarbamol (ROBAXIN) 500 MG tablet Take 1 tablet (500 mg total) by mouth every 6 (six) hours as needed for muscle spasms. 60 tablet 11  . metoprolol tartrate (LOPRESSOR) 25 MG tablet TAKE 1/2 TABLET BY MOUTH DAILY. 30 tablet 1  . PARoxetine (PAXIL) 20 MG tablet TAKE 1 TABLET (20 MG TOTAL) BY MOUTH DAILY. 30 tablet 11  . traMADol (ULTRAM) 50 MG tablet Take 50 mg by mouth 2 (two) times daily as needed.     . warfarin (COUMADIN) 5 MG tablet Take as directed by [facility] 45 tablet 3   No current facility-administered medications for this visit.    Allergies  Allergen Reactions  . Lisinopril Cough  . Niacin     contraindication    Review of Systems negative except from HPI and PMH  Physical Exam BP  100/62 mmHg  Pulse 77  Ht 5\' 7"  (1.702 m)  Wt 87.998 kg (194 lb)  BMI 30.38 kg/m2 Well developed and well nourished in no acute distress HENT normal E scleral and icterus clear Neck Supple JVP flat; carotids brisk and full Clear on R and decreased on left IrRegular rate and rhythm, no murmurs gallops or rub Soft with active bowel sounds No clubbing cyanosis none Edema Alert and oriented, grossly normal motor and sensory function Skin Warm and Dry  ECG demonstrates atrial fibrillation 77 intervals-/09/36 Low-voltage  Assessment and  Plan  Atrial fibrillation-permanent  Dyspnea on exertion improved  Coronary artery disease with prior bypass/mitral valve repair; prior inferior wall MI-EF 52%  Cough  Preoperative cardiovascular evaluation    He continues with cough   We will stop losartan   Will continue diuretic and increase to chlorthalidone 25   bmet in 2 weekshe is  The patient has had prior strokes. He will need bridging on his warfarin with heparin prior to his epidural injection. She will let us know the dates of this.

## 2014-04-26 NOTE — Addendum Note (Signed)
Addended by: Stanton Kidney on: 04/26/2014 09:35 AM   Modules accepted: Orders

## 2014-04-28 DIAGNOSIS — M25551 Pain in right hip: Secondary | ICD-10-CM | POA: Diagnosis not present

## 2014-04-28 DIAGNOSIS — M4806 Spinal stenosis, lumbar region: Secondary | ICD-10-CM | POA: Diagnosis not present

## 2014-04-28 DIAGNOSIS — M25552 Pain in left hip: Secondary | ICD-10-CM | POA: Diagnosis not present

## 2014-04-28 DIAGNOSIS — M25571 Pain in right ankle and joints of right foot: Secondary | ICD-10-CM | POA: Diagnosis not present

## 2014-04-28 DIAGNOSIS — M47816 Spondylosis without myelopathy or radiculopathy, lumbar region: Secondary | ICD-10-CM | POA: Diagnosis not present

## 2014-05-01 ENCOUNTER — Ambulatory Visit (INDEPENDENT_AMBULATORY_CARE_PROVIDER_SITE_OTHER): Payer: Medicare Other | Admitting: *Deleted

## 2014-05-01 ENCOUNTER — Telehealth: Payer: Self-pay | Admitting: Internal Medicine

## 2014-05-01 DIAGNOSIS — I4892 Unspecified atrial flutter: Secondary | ICD-10-CM

## 2014-05-01 DIAGNOSIS — I639 Cerebral infarction, unspecified: Secondary | ICD-10-CM | POA: Diagnosis not present

## 2014-05-01 LAB — POCT INR: INR: 2.9

## 2014-05-01 MED ORDER — ENOXAPARIN SODIUM 80 MG/0.8ML ~~LOC~~ SOLN: 80.0000 mg | Freq: Two times a day (BID) | SUBCUTANEOUS | Status: DC

## 2014-05-01 NOTE — Telephone Encounter (Signed)
Pt trying to get  Bridging issue taking care of asap, pls advise [phone] or cell [phone]

## 2014-05-01 NOTE — Telephone Encounter (Signed)
Spoke with pt.  He does not have a date for his injection set yet but wants to order his Lovenox injections prior to the end of the year.  He is overdue for INR follow up.  He will come in this after for INR check.  Dr. Olin Pia OV note sent to MD for cardiac clearance.

## 2014-05-06 ENCOUNTER — Other Ambulatory Visit: Payer: Self-pay | Admitting: Cardiology

## 2014-05-10 ENCOUNTER — Other Ambulatory Visit (INDEPENDENT_AMBULATORY_CARE_PROVIDER_SITE_OTHER): Payer: Medicare Other | Admitting: *Deleted

## 2014-05-10 DIAGNOSIS — I482 Chronic atrial fibrillation, unspecified: Secondary | ICD-10-CM

## 2014-05-11 LAB — BASIC METABOLIC PANEL
BUN: 21 mg/dL (ref 6–23)
CO2: 27 mEq/L (ref 19–32)
CREATININE: 1 mg/dL (ref 0.4–1.5)
Calcium: 8.5 mg/dL (ref 8.4–10.5)
Chloride: 98 mEq/L (ref 96–112)
GFR: 73.4 mL/min (ref >60.00)
Glucose, Bld: 95 mg/dL (ref 70–99)
POTASSIUM: 3.5 meq/L (ref 3.5–5.1)
Sodium: 136 mEq/L (ref 135–145)

## 2014-05-12 ENCOUNTER — Telehealth: Payer: Self-pay | Admitting: *Deleted

## 2014-05-12 DIAGNOSIS — I10 Essential (primary) hypertension: Secondary | ICD-10-CM

## 2014-05-12 NOTE — Telephone Encounter (Signed)
pt notified about lab results and agreeable to repeat bmet 12/28. Pt states he will be having epidural injections in the very near future.

## 2014-05-17 ENCOUNTER — Telehealth: Payer: Self-pay | Admitting: Internal Medicine

## 2014-05-17 NOTE — Telephone Encounter (Signed)
New Msg   Cassie from [facility] calling please contact at [phone]. Call is in regards to a fax that was sent for to get Clearance for pt, she states first one was sent Nov 1st and she will send another today.

## 2014-05-17 NOTE — Telephone Encounter (Signed)
Spoke with pt.  Procedure scheduled for 12/23.  Has appt on 12/14 to give him Lovenox instructions.  Will need INR on 12/22 prior to procedure.

## 2014-05-17 NOTE — Telephone Encounter (Signed)
Explained I never received fax sent last month, but received fax this morning. Will address with Dr. Caryl Comes this afternoon for signature.

## 2014-05-17 NOTE — Telephone Encounter (Signed)
Informed patient that I received fax this morning.  I will have Dr. Caryl Comes sign medical clearance today and fax to Orthopedics office. Patient is appreciative.

## 2014-05-17 NOTE — Telephone Encounter (Signed)
New message      Talk to [facility] regarding getting cleared to have a procedure.  He is on coumadin

## 2014-05-17 NOTE — Telephone Encounter (Signed)
New message     This man request to talk to Gay Filler only.   Please call

## 2014-05-18 ENCOUNTER — Ambulatory Visit (INDEPENDENT_AMBULATORY_CARE_PROVIDER_SITE_OTHER): Payer: Medicare Other | Admitting: Family Medicine

## 2014-05-18 ENCOUNTER — Encounter: Payer: Self-pay | Admitting: Family Medicine

## 2014-05-18 VITALS — BP 122/74 | HR 72 | Temp 97.5°F | Ht 66.5 in | Wt 194.0 lb

## 2014-05-18 DIAGNOSIS — C61 Malignant neoplasm of prostate: Secondary | ICD-10-CM

## 2014-05-18 DIAGNOSIS — F418 Other specified anxiety disorders: Secondary | ICD-10-CM | POA: Diagnosis not present

## 2014-05-18 DIAGNOSIS — I25709 Atherosclerosis of coronary artery bypass graft(s), unspecified, with unspecified angina pectoris: Secondary | ICD-10-CM

## 2014-05-18 DIAGNOSIS — Z23 Encounter for immunization: Secondary | ICD-10-CM | POA: Diagnosis not present

## 2014-05-18 DIAGNOSIS — I2510 Atherosclerotic heart disease of native coronary artery without angina pectoris: Secondary | ICD-10-CM | POA: Diagnosis not present

## 2014-05-18 DIAGNOSIS — I4892 Unspecified atrial flutter: Secondary | ICD-10-CM

## 2014-05-18 DIAGNOSIS — I10 Essential (primary) hypertension: Secondary | ICD-10-CM

## 2014-05-18 LAB — LIPID PANEL
CHOLESTEROL: 146 mg/dL (ref 0–200)
HDL: 59.9 mg/dL (ref >39.00)
LDL Cholesterol: 69 mg/dL (ref 0–99)
NonHDL: 86.1
Total CHOL/HDL Ratio: 2
Triglycerides: 85 mg/dL (ref 0.0–149.0)
VLDL: 17 mg/dL (ref 0.0–40.0)

## 2014-05-18 LAB — POCT URINALYSIS DIPSTICK
BILIRUBIN UA: NEGATIVE
Blood, UA: NEGATIVE
Glucose, UA: NEGATIVE
KETONES UA: NEGATIVE
LEUKOCYTES UA: NEGATIVE
Nitrite, UA: NEGATIVE
Protein, UA: NEGATIVE
Spec Grav, UA: 1.02
Urobilinogen, UA: 0.2
pH, UA: 5.5

## 2014-05-18 LAB — CBC WITH DIFFERENTIAL/PLATELET
BASOS PCT: 0.6 % (ref 0.0–3.0)
Basophils Absolute: 0 10*3/uL (ref 0.0–0.1)
Eosinophils Absolute: 0.1 10*3/uL (ref 0.0–0.7)
Eosinophils Relative: 1.8 % (ref 0.0–5.0)
HCT: 45.8 % (ref 39.0–52.0)
HEMOGLOBIN: 15.2 g/dL (ref 13.0–17.0)
LYMPHS PCT: 31.4 % (ref 12.0–46.0)
Lymphs Abs: 2.5 10*3/uL (ref 0.7–4.0)
MCHC: 33.2 g/dL (ref 30.0–36.0)
MCV: 94.1 fl (ref 78.0–100.0)
Monocytes Absolute: 0.8 10*3/uL (ref 0.1–1.0)
Monocytes Relative: 10 % (ref 3.0–12.0)
NEUTROS ABS: 4.5 10*3/uL (ref 1.4–7.7)
Neutrophils Relative %: 56.2 % (ref 43.0–77.0)
Platelets: 167 10*3/uL (ref 150.0–400.0)
RBC: 4.87 Mil/uL (ref 4.22–5.81)
RDW: 13.8 % (ref 11.5–15.5)
WBC: 8.1 10*3/uL (ref 4.0–10.5)

## 2014-05-18 LAB — HEPATIC FUNCTION PANEL
ALK PHOS: 70 U/L (ref 39–117)
ALT: 23 U/L (ref 0–53)
AST: 23 U/L (ref 0–37)
Albumin: 3.9 g/dL (ref 3.5–5.2)
BILIRUBIN DIRECT: 0.2 mg/dL (ref 0.0–0.3)
BILIRUBIN TOTAL: 1 mg/dL (ref 0.2–1.2)
Total Protein: 6.3 g/dL (ref 6.0–8.3)

## 2014-05-18 LAB — PSA: PSA: 0 ng/mL — AB (ref 0.10–4.00)

## 2014-05-18 LAB — TSH: TSH: 1.05 u[IU]/mL (ref 0.35–4.50)

## 2014-05-18 MED ORDER — TRIAMCINOLONE ACETONIDE 0.1 % EX CREA: 1.0000 "application " | TOPICAL_CREAM | Freq: Two times a day (BID) | CUTANEOUS | Status: DC

## 2014-05-18 NOTE — Progress Notes (Signed)
   Subjective:    Patient ID: [name], male    DOB: 05/20/1934, 80 y.o.   MRN: [id number]  HPI 80 yr old male for a cpx. He feels well in general but he has had some right knee pain. He is seeing Orthopedics for this. He sees Cardiology regularly and he denies chest pains or SOB. He sees Dr. Risa Grill twice a year.    Review of Systems  Constitutional: Negative.   HENT: Negative.   Eyes: Negative.   Respiratory: Negative.   Cardiovascular: Negative.   Gastrointestinal: Negative.   Genitourinary: Negative.   Musculoskeletal: Negative.   Skin: Negative.   Neurological: Negative.   Psychiatric/Behavioral: Negative.        Objective:   Physical Exam  Constitutional: He is oriented to person, place, and time. He appears well-developed and well-nourished. No distress.  HENT:  Head: Normocephalic and atraumatic.  Right Ear: External ear normal.  Left Ear: External ear normal.  Nose: Nose normal.  Mouth/Throat: Oropharynx is clear and moist. No oropharyngeal exudate.  Eyes: Conjunctivae and EOM are normal. Pupils are equal, round, and reactive to light. Right eye exhibits no discharge. Left eye exhibits no discharge. No scleral icterus.  Neck: Neck supple. No JVD present. No tracheal deviation present. No thyromegaly present.  Cardiovascular: Normal rate, regular rhythm, normal heart sounds and intact distal pulses.  Exam reveals no gallop and no friction rub.   No murmur heard. Pulmonary/Chest: Effort normal and breath sounds normal. No respiratory distress. He has no wheezes. He has no rales. He exhibits no tenderness.  Abdominal: Soft. Bowel sounds are normal. He exhibits no distension and no mass. There is no tenderness. There is no rebound and no guarding.  Genitourinary: Rectum normal, prostate normal and penis normal. Guaiac negative stool. No penile tenderness.  Musculoskeletal: Normal range of motion. He exhibits no edema or tenderness.  Lymphadenopathy:    He has no  cervical adenopathy.  Neurological: He is alert and oriented to person, place, and time. He has normal reflexes. No cranial nerve deficit. He exhibits normal muscle tone. Coordination normal.  Skin: Skin is warm and dry. No rash noted. He is not diaphoretic. No erythema. No pallor.  Psychiatric: He has a normal mood and affect. His behavior is normal. Judgment and thought content normal.          Assessment & Plan:  He seems to be doing well. Get fasting labs today.

## 2014-05-18 NOTE — Addendum Note (Signed)
Addended by: Aggie Hacker A on: 05/18/2014 10:35 AM   Modules accepted: Orders

## 2014-05-18 NOTE — Progress Notes (Signed)
Pre visit review using our clinic review tool, if applicable. No additional management support is needed unless otherwise documented below in the visit note. 

## 2014-05-19 DIAGNOSIS — M25562 Pain in left knee: Secondary | ICD-10-CM | POA: Diagnosis not present

## 2014-05-19 DIAGNOSIS — M25561 Pain in right knee: Secondary | ICD-10-CM | POA: Diagnosis not present

## 2014-05-19 DIAGNOSIS — M170 Bilateral primary osteoarthritis of knee: Secondary | ICD-10-CM | POA: Diagnosis not present

## 2014-05-19 NOTE — Telephone Encounter (Signed)
Late Entry: Faxed clearance on 12/9 to [employer]. Ok to stop Coumadin, needs Lovenox bridging. Our coumadin clinic is handling orders for bridging.

## 2014-05-22 ENCOUNTER — Ambulatory Visit (INDEPENDENT_AMBULATORY_CARE_PROVIDER_SITE_OTHER): Payer: Medicare Other | Admitting: *Deleted

## 2014-05-22 DIAGNOSIS — R531 Weakness: Secondary | ICD-10-CM | POA: Diagnosis not present

## 2014-05-22 DIAGNOSIS — M25561 Pain in right knee: Secondary | ICD-10-CM | POA: Diagnosis not present

## 2014-05-22 DIAGNOSIS — I639 Cerebral infarction, unspecified: Secondary | ICD-10-CM

## 2014-05-22 DIAGNOSIS — I4892 Unspecified atrial flutter: Secondary | ICD-10-CM | POA: Diagnosis not present

## 2014-05-22 DIAGNOSIS — M1711 Unilateral primary osteoarthritis, right knee: Secondary | ICD-10-CM | POA: Diagnosis not present

## 2014-05-22 DIAGNOSIS — M25562 Pain in left knee: Secondary | ICD-10-CM | POA: Diagnosis not present

## 2014-05-22 LAB — POCT INR: INR: 3.3

## 2014-05-22 NOTE — Patient Instructions (Addendum)
05/25/2014 last day to take coumadin  05/26/2014 no coumadin no lovenox 05/27/2014 no coumadin Lovenox 80 mg 8am and Lovenox 80mg  8pm 05/28/2014 no coumadin Lovenox 80mg  8am  And lovenox 80 mg 8pm 05/29/2014 no coumadin lovenox 80mg  8am and Lovenox 80mg  8pm 05/30/2014 no coumadin Lovenox 80 mg 8am only no lovenox in the pm 05/31/2014 no coumadin no Lovenox day of procedure and when instructed by Dr doing procedure to restart coumadin and lovenox restart at same dose Coumadin 5mg  daily except 7.5mg  on Mondays Wednesdays and Fridays and Lovenox 80 mg 8am and Lovenox 8pm and continue these medications until return for recheck on Monday December 28th Is scheduled for INR check on December 22nd before procedure on the 23rd

## 2014-05-25 DIAGNOSIS — M1711 Unilateral primary osteoarthritis, right knee: Secondary | ICD-10-CM | POA: Diagnosis not present

## 2014-05-25 DIAGNOSIS — R531 Weakness: Secondary | ICD-10-CM | POA: Diagnosis not present

## 2014-05-25 DIAGNOSIS — M25561 Pain in right knee: Secondary | ICD-10-CM | POA: Diagnosis not present

## 2014-05-25 DIAGNOSIS — M25562 Pain in left knee: Secondary | ICD-10-CM | POA: Diagnosis not present

## 2014-05-29 DIAGNOSIS — M25561 Pain in right knee: Secondary | ICD-10-CM | POA: Diagnosis not present

## 2014-05-29 DIAGNOSIS — M1711 Unilateral primary osteoarthritis, right knee: Secondary | ICD-10-CM | POA: Diagnosis not present

## 2014-05-29 DIAGNOSIS — M25562 Pain in left knee: Secondary | ICD-10-CM | POA: Diagnosis not present

## 2014-05-29 DIAGNOSIS — R531 Weakness: Secondary | ICD-10-CM | POA: Diagnosis not present

## 2014-05-30 ENCOUNTER — Ambulatory Visit (INDEPENDENT_AMBULATORY_CARE_PROVIDER_SITE_OTHER): Payer: Medicare Other | Admitting: Pharmacist

## 2014-05-30 ENCOUNTER — Ambulatory Visit: Payer: Medicare Other | Admitting: Internal Medicine

## 2014-05-30 DIAGNOSIS — I4892 Unspecified atrial flutter: Secondary | ICD-10-CM

## 2014-05-30 DIAGNOSIS — I639 Cerebral infarction, unspecified: Secondary | ICD-10-CM

## 2014-05-30 LAB — POCT INR: INR: 1.2

## 2014-05-30 NOTE — Patient Instructions (Signed)
Start Coumadin back 05/31/14 after procedure. Start lovenox back on 06/01/14. Continue 1 tablet everyday except 1.5 tablets on Monday, Wednesday, and Friday. Recheck 1 week after procedure on 06/05/14.

## 2014-05-31 DIAGNOSIS — M545 Low back pain: Secondary | ICD-10-CM | POA: Diagnosis not present

## 2014-05-31 DIAGNOSIS — M47817 Spondylosis without myelopathy or radiculopathy, lumbosacral region: Secondary | ICD-10-CM | POA: Diagnosis not present

## 2014-05-31 DIAGNOSIS — M4806 Spinal stenosis, lumbar region: Secondary | ICD-10-CM | POA: Diagnosis not present

## 2014-06-05 ENCOUNTER — Telehealth: Payer: Self-pay | Admitting: Cardiovascular Disease

## 2014-06-05 ENCOUNTER — Ambulatory Visit (INDEPENDENT_AMBULATORY_CARE_PROVIDER_SITE_OTHER): Payer: Medicare Other | Admitting: Pharmacist

## 2014-06-05 DIAGNOSIS — I639 Cerebral infarction, unspecified: Secondary | ICD-10-CM

## 2014-06-05 DIAGNOSIS — I4892 Unspecified atrial flutter: Secondary | ICD-10-CM | POA: Diagnosis not present

## 2014-06-05 LAB — POCT INR: INR: 1.5

## 2014-06-05 NOTE — Telephone Encounter (Signed)
New Message       Patient states Heartrate is way up. It has gotten as high as 120 and that is just from walking slowly. Patient states that it is bouncing all over the place.

## 2014-06-06 ENCOUNTER — Telehealth: Payer: Self-pay | Admitting: Internal Medicine

## 2014-06-06 NOTE — Telephone Encounter (Signed)
Spoke with pt who reports having a recent "double epidural steroid shot" in both hips.  HR got up to around 115 bpm yesterday with walking.  Today it seems to have calmed down some.  HR now 75-78.  He will continue to monitor his HR.  Advised HR will gradually return to normal but if it goes up again and doesn't return to normal he should call back.  He states understanding and thanked me for calling and the advise.

## 2014-06-06 NOTE — Telephone Encounter (Signed)
New message      Talk to a nurse before 11am.  Pt states he called yesterday regarding his heart rate and no one called him back.

## 2014-06-06 NOTE — Telephone Encounter (Signed)
Please see telephone note from 06/06/2014.

## 2014-06-07 ENCOUNTER — Other Ambulatory Visit: Payer: Self-pay | Admitting: Internal Medicine

## 2014-06-07 ENCOUNTER — Ambulatory Visit (INDEPENDENT_AMBULATORY_CARE_PROVIDER_SITE_OTHER): Payer: Medicare Other | Admitting: Pharmacist

## 2014-06-07 ENCOUNTER — Other Ambulatory Visit (INDEPENDENT_AMBULATORY_CARE_PROVIDER_SITE_OTHER): Payer: Medicare Other | Admitting: *Deleted

## 2014-06-07 DIAGNOSIS — I10 Essential (primary) hypertension: Secondary | ICD-10-CM

## 2014-06-07 DIAGNOSIS — I4892 Unspecified atrial flutter: Secondary | ICD-10-CM | POA: Diagnosis not present

## 2014-06-07 DIAGNOSIS — I639 Cerebral infarction, unspecified: Secondary | ICD-10-CM | POA: Diagnosis not present

## 2014-06-07 LAB — POCT INR: INR: 2.6

## 2014-06-08 LAB — BASIC METABOLIC PANEL
BUN: 22 mg/dL (ref 6–23)
CALCIUM: 9.3 mg/dL (ref 8.4–10.5)
CHLORIDE: 97 meq/L (ref 96–112)
CO2: 32 meq/L (ref 19–32)
CREATININE: 1.3 mg/dL (ref 0.4–1.5)
GFR: 57.23 mL/min — ABNORMAL LOW (ref >60.00)
Glucose, Bld: 86 mg/dL (ref 70–99)
Potassium: 4.1 mEq/L (ref 3.5–5.1)
Sodium: 136 mEq/L (ref 135–145)

## 2014-06-12 ENCOUNTER — Telehealth: Payer: Self-pay | Admitting: Internal Medicine

## 2014-06-12 NOTE — Telephone Encounter (Signed)
Advised this is a possible side effect of medication. Advised to continue to monitor heart rate over the next few days. Patient states that he doubled his Metoprolol to 25 mg (without advisement from physician) while heart rate is increased from steroids. Patient will call back if heart rate problems persists, worsens, or does not improve.

## 2014-06-12 NOTE — Telephone Encounter (Signed)
New message      Pt has had epidural shots and heart rate has been running high----as high as 120 from walking to one room to the other.  Please call

## 2014-06-15 ENCOUNTER — Ambulatory Visit (INDEPENDENT_AMBULATORY_CARE_PROVIDER_SITE_OTHER): Payer: Medicare Other | Admitting: Pharmacist

## 2014-06-15 DIAGNOSIS — I4892 Unspecified atrial flutter: Secondary | ICD-10-CM

## 2014-06-15 DIAGNOSIS — I639 Cerebral infarction, unspecified: Secondary | ICD-10-CM | POA: Diagnosis not present

## 2014-06-15 LAB — POCT INR: INR: 3.1

## 2014-06-19 DIAGNOSIS — M47817 Spondylosis without myelopathy or radiculopathy, lumbosacral region: Secondary | ICD-10-CM | POA: Diagnosis not present

## 2014-06-22 ENCOUNTER — Encounter (HOSPITAL_BASED_OUTPATIENT_CLINIC_OR_DEPARTMENT_OTHER): Payer: Self-pay | Admitting: Orthopedic Surgery

## 2014-06-23 DIAGNOSIS — M1711 Unilateral primary osteoarthritis, right knee: Secondary | ICD-10-CM | POA: Diagnosis not present

## 2014-06-29 ENCOUNTER — Ambulatory Visit (INDEPENDENT_AMBULATORY_CARE_PROVIDER_SITE_OTHER): Payer: Medicare Other | Admitting: *Deleted

## 2014-06-29 DIAGNOSIS — I639 Cerebral infarction, unspecified: Secondary | ICD-10-CM

## 2014-06-29 DIAGNOSIS — M1711 Unilateral primary osteoarthritis, right knee: Secondary | ICD-10-CM | POA: Diagnosis not present

## 2014-06-29 DIAGNOSIS — I4892 Unspecified atrial flutter: Secondary | ICD-10-CM | POA: Diagnosis not present

## 2014-06-29 DIAGNOSIS — M545 Low back pain: Secondary | ICD-10-CM | POA: Diagnosis not present

## 2014-06-29 LAB — POCT INR: INR: 3.6

## 2014-07-03 ENCOUNTER — Ambulatory Visit (INDEPENDENT_AMBULATORY_CARE_PROVIDER_SITE_OTHER): Payer: Medicare Other | Admitting: Family Medicine

## 2014-07-03 ENCOUNTER — Encounter: Payer: Self-pay | Admitting: Family Medicine

## 2014-07-03 VITALS — BP 134/91 | HR 87 | Temp 97.5°F | Ht 66.5 in | Wt 189.0 lb

## 2014-07-03 DIAGNOSIS — I639 Cerebral infarction, unspecified: Secondary | ICD-10-CM

## 2014-07-03 DIAGNOSIS — M5442 Lumbago with sciatica, left side: Secondary | ICD-10-CM | POA: Diagnosis not present

## 2014-07-03 LAB — POCT URINALYSIS DIPSTICK
Bilirubin, UA: NEGATIVE
Glucose, UA: NEGATIVE
Ketones, UA: NEGATIVE
Nitrite, UA: NEGATIVE
Spec Grav, UA: 1.025
Urobilinogen, UA: 0.2
pH, UA: 6

## 2014-07-03 NOTE — Progress Notes (Signed)
   Subjective:    Patient ID: [name], male    DOB: 01-Jun-1934, 81 y.o.   MRN: [id number]  HPI Here to discuss left sided low back pain. He is wearing a TENS unit and this helps a little. He has been seeing the [facility] for low back pain and he has received 2 epidural steroid injections so far with little improvement. He wonders if he has a kidney infection. No fever or urgency or blood in the urine.    Review of Systems  Gastrointestinal: Negative.   Genitourinary: Negative.   Musculoskeletal: Positive for back pain.       Objective:   Physical Exam  Constitutional: He appears well-developed and well-nourished.  Abdominal: Soft. Bowel sounds are normal. He exhibits no distension and no mass. There is no tenderness. There is no rebound and no guarding.  Musculoskeletal:  Tender over the left lower back and left sciatic notch          Assessment & Plan:  He does not have a urine infection. His pain is from his spine. We will refer him to [facility].

## 2014-07-03 NOTE — Progress Notes (Signed)
Pre visit review using our clinic review tool, if applicable. No additional management support is needed unless otherwise documented below in the visit note. 

## 2014-07-03 NOTE — Addendum Note (Signed)
Addended by: Aggie Hacker A on: 07/03/2014 11:55 AM   Modules accepted: Orders

## 2014-07-06 DIAGNOSIS — M1711 Unilateral primary osteoarthritis, right knee: Secondary | ICD-10-CM | POA: Diagnosis not present

## 2014-07-11 ENCOUNTER — Ambulatory Visit (INDEPENDENT_AMBULATORY_CARE_PROVIDER_SITE_OTHER): Payer: Medicare Other

## 2014-07-11 DIAGNOSIS — M5137 Other intervertebral disc degeneration, lumbosacral region: Secondary | ICD-10-CM | POA: Diagnosis not present

## 2014-07-11 DIAGNOSIS — I639 Cerebral infarction, unspecified: Secondary | ICD-10-CM

## 2014-07-11 DIAGNOSIS — I4892 Unspecified atrial flutter: Secondary | ICD-10-CM

## 2014-07-11 LAB — POCT INR: INR: 3.9

## 2014-07-13 ENCOUNTER — Ambulatory Visit (HOSPITAL_COMMUNITY)
Admission: RE | Admit: 2014-07-13 | Discharge: 2014-07-13 | Disposition: A | Discharge status: Home / Self Care | Payer: Medicare Other | Source: Ambulatory Visit | Attending: Vascular Surgery | Admitting: Vascular Surgery

## 2014-07-13 ENCOUNTER — Other Ambulatory Visit (HOSPITAL_COMMUNITY): Payer: Self-pay | Admitting: Neurosurgery

## 2014-07-13 ENCOUNTER — Other Ambulatory Visit: Payer: Self-pay | Admitting: Internal Medicine

## 2014-07-13 DIAGNOSIS — I739 Peripheral vascular disease, unspecified: Secondary | ICD-10-CM

## 2014-07-18 DIAGNOSIS — M4807 Spinal stenosis, lumbosacral region: Secondary | ICD-10-CM | POA: Diagnosis not present

## 2014-07-18 DIAGNOSIS — Z6829 Body mass index (BMI) 29.0-29.9, adult: Secondary | ICD-10-CM | POA: Diagnosis not present

## 2014-07-21 ENCOUNTER — Emergency Department (INDEPENDENT_AMBULATORY_CARE_PROVIDER_SITE_OTHER)
Admission: EM | Admit: 2014-07-21 | Discharge: 2014-07-21 | Disposition: A | Discharge status: Home / Self Care | Payer: Medicare Other | Source: Home / Self Care | Attending: Family Medicine | Admitting: Family Medicine

## 2014-07-21 ENCOUNTER — Encounter (HOSPITAL_COMMUNITY): Payer: Self-pay | Admitting: Emergency Medicine

## 2014-07-21 DIAGNOSIS — T161XXA Foreign body in right ear, initial encounter: Secondary | ICD-10-CM

## 2014-07-21 NOTE — Discharge Instructions (Signed)
Ear Foreign Body °An ear foreign body is an object that is stuck in the ear. It is common for young children to put objects into the ear canal. These may include pebbles, beads, beans, and any other small objects which will fit. In adults, objects such as cotton swabs may become lodged in the ear canal. In all ages, the most common foreign bodies are insects that enter the ear canal.  °SYMPTOMS  °Foreign bodies may cause pain, buzzing or roaring sounds, hearing loss, and ear drainage.  °HOME CARE INSTRUCTIONS  °· Keep all follow-up appointments with your caregiver as told. °· Keep small objects out of reach of young children. Tell them not to put anything in their ears. °SEEK IMMEDIATE MEDICAL CARE IF:  °· You have bleeding from the ear. °· You have increased pain or swelling of the ear. °· You have reduced hearing. °· You have discharge coming from the ear. °· You have a fever. °· You have a headache. °MAKE SURE YOU:  °· Understand these instructions. °· Will watch your condition. °· Will get help right away if you are not doing well or get worse. °Document Released: 05/23/2000 Document Revised: 08/18/2011 Document Reviewed: 01/12/2008 °ExitCare® Patient Information ©2015 [employer]. This information is not intended to replace advice given to you by your health care provider. Make sure you discuss any questions you have with your health care provider. ° °

## 2014-07-21 NOTE — ED Provider Notes (Signed)
CSN: [id number]     Arrival date & time 07/21/14  2683 History   First MD Initiated Contact with Patient 07/21/14 [id number]     Chief Complaint  Patient presents with  . Foreign Body in [place]   (Consider location/radiation/quality/duration/timing/severity/associated sxs/prior Treatment) HPI Comments: A small rounded rubber ring like structure that is attached to a hearing device accidentally came off of the device he remained in the right auditory canal. The patient replaced the part and applied it to the eardrum inserted into the ear. This action pushed the foreign body further back into the ear. He went to the audiology/hearing aid store and the person there was unable to remove it. It is not causing pain.   Past Medical History  Diagnosis Date  . Chickenpox   . Blood transfusion abn reaction or complication, no procedure mishap   . History of colonic polyps     adenomatous  . Coronary artery disease-s/p CABG and MV annuloplasty   . Hyperlipidemia   . Hypertension   . Urinary incontinence   . Onychomycosis   . Central retinal artery occlusion--CVA and   . Atrial fibrillation // Atrial flutter   . History of blood clots   . GI bleed     after colonoscopy/polyp removal  . Anemia, posthemorrhagic, acute   . Prostate cancer   . 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 Insomnia   . Arthritis     Hands  . 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 Depression    Past Surgical History  Procedure Laterality Date  . Mitral valve replacement  2007  . Radical prostatectomy with radiation  2003  . A flutter ablation  2010  . Bladder pump installed  2011    Dr. McDiarmid  . Colonoscopy w/ biopsies and polypectomy  07-14-11    per Dr. Carlean Purl, adenomatous polyps, repeat in 3 yrs   . Inguinal hernia repair  1993    bilatera  . Colonoscopy  06/23/2011    Procedure: COLONOSCOPY;  Surgeon: Gatha Mayer, MD;  Location: [facility];  Service: Endoscopy;  Laterality: N/A;  . Mitral valve ring placement      Per pt he did not have a mitral valve replacement  . Coronary artery bypass graft  1989    4 vessels  . Coronary artery bypass graft  2007    and mitral valve replacement   . Eye surgery      Catarat with Lens Bil  . Cancer of right thumb nail    . Total knee arthroplasty  08/27/2011    Procedure: TOTAL KNEE ARTHROPLASTY;  Surgeon: Ninetta Lights, MD;  Location: [place];  Service: Orthopedics;  Laterality: Left;  90 MINUTES FOR SURGERY  . Carpal tunnel release Right 03/01/2013    Procedure: RIGHT CARPAL TUNNEL RELEASE;  Surgeon: Tennis Must, MD;  Location: [place];  Service: Orthopedics;  Laterality: Right;   Family History  Problem Relation Age of Onset  . Alcohol abuse Mother   . Hypertension Mother   . Stroke Mother   . Diabetes Mother   . Coronary artery disease Father   . Cancer Daughter     renal cell cancer  . Colon cancer Neg Hx   . Esophageal cancer Neg Hx   . Stomach cancer Neg Hx   . Anesthesia problems Neg Hx    History  Substance Use Topics  . Smoking status: Former Smoker -- 10 years  Quit date: 06/09/1977  . Smokeless tobacco: Never Used  . Alcohol Use: 0.0 oz/week    0 Standard drinks or equivalent per week     Comment: occ    Review of Systems  Constitutional: Negative.   HENT: Negative for ear pain.   All other systems reviewed and are negative.   Allergies  Lisinopril and Niacin  Home Medications   Prior to Admission medications   Medication Sig Start Date End Date Taking? Authorizing Provider  ALPRAZolam Duanne Moron) 1 MG tablet TAKE 2 TABLETS BY MOUTH AT BEDTIME AS NEEDED. 03/09/14   Laurey Morale, MD  atorvastatin (LIPITOR) 40 MG tablet TAKE 1 TABLET BY MOUTH DAILY. 05/09/14   Deboraha Sprang, MD  chlorthalidone (HYGROTON) 25 MG tablet Take 1 tablet (25 mg total) by mouth daily. Patient not taking: Reported on 07/03/2014 04/26/14   Deboraha Sprang, MD  enoxaparin (LOVENOX) 80 MG/0.8ML injection Inject 0.8 mLs (80 mg total) into the  skin every 12 (twelve) hours. Patient not taking: Reported on 05/18/2014 05/01/14   Deboraha Sprang, MD  methocarbamol (ROBAXIN) 500 MG tablet Take 1 tablet (500 mg total) by mouth every 6 (six) hours as needed for muscle spasms. 09/23/13   Laurey Morale, MD  metoprolol tartrate (LOPRESSOR) 25 MG tablet TAKE 1/2 TABLET BY MOUTH DAILY. 06/08/14   Deboraha Sprang, MD  PARoxetine (PAXIL) 20 MG tablet TAKE 1 TABLET (20 MG TOTAL) BY MOUTH DAILY. 01/23/14   Laurey Morale, MD  traMADol (ULTRAM) 50 MG tablet Take 50 mg by mouth 2 (two) times daily as needed.  08/11/13   Historical Provider, MD  triamcinolone cream (KENALOG) 0.1 % Apply 1 application topically 2 (two) times daily. Patient not taking: Reported on 07/03/2014 05/18/14   Laurey Morale, MD  warfarin (COUMADIN) 5 MG tablet TAKE AS DIRECTED BY COUMADIN CLINIC 07/13/14   Deboraha Sprang, MD   BP 124/76 mmHg  Pulse 74  Temp(Src) 97.4 F (36.3 C) (Oral)  SpO2 95% Physical Exam  Constitutional: He is oriented to person, place, and time. He appears well-developed and well-nourished. No distress.  HENT:  Right ear canal is free of cerumen or other debris. No redness or swelling. The preformed flexible rubber  part is located adjacent to the TM. TM is without erythema or swelling.  Eyes: Conjunctivae and EOM are normal.  Neurological: He is alert and oriented to person, place, and time. He exhibits normal muscle tone.  Skin: Skin is warm and dry.  Psychiatric: He has a normal mood and affect.  Nursing note and vitals reviewed.   ED Course  FOREIGN BODY REMOVAL Date/Time: 07/21/2014 9:48 AM Performed by: Marcha Dutton, Whit Bruni Authorized by: Ihor Gully D Consent: Verbal consent obtained. Risks and benefits: risks, benefits and alternatives were discussed Consent given by: patient Patient identity confirmed: verbally with patient Body area: ear Location details: right ear Patient sedated: no Patient restrained: no Patient cooperative: yes Localization  method: ENT speculum and visualized Removal mechanism: irrigation Complexity: simple 1 objects recovered. Objects recovered: hearing aid part Post-procedure assessment: foreign body removed Patient tolerance: Patient tolerated the procedure well with no immediate complications   (including critical care time) Labs Review Labs Reviewed - No data to display  Imaging Review No results found.   MDM   1. Foreign body in ear, right, initial encounter    Irrigation til the object at the  Precipice of the meatus then removed with blunt hemostats. EAC clear without pathology  Janne Napoleon, NP 07/21/14 [phone]

## 2014-07-21 NOTE — ED Notes (Signed)
Pt thinks he has the rubber tip to his hearing aid stuck in his right ear.  The aid tech states he saw it and tried to get it out but was unsuccessful.  Pt denies any pain.

## 2014-07-25 ENCOUNTER — Ambulatory Visit (INDEPENDENT_AMBULATORY_CARE_PROVIDER_SITE_OTHER): Payer: Medicare Other | Admitting: *Deleted

## 2014-07-25 DIAGNOSIS — I4892 Unspecified atrial flutter: Secondary | ICD-10-CM

## 2014-07-25 DIAGNOSIS — I639 Cerebral infarction, unspecified: Secondary | ICD-10-CM

## 2014-07-25 LAB — POCT INR: INR: 2.5

## 2014-08-08 ENCOUNTER — Encounter: Payer: Self-pay | Admitting: Internal Medicine

## 2014-08-09 ENCOUNTER — Ambulatory Visit (INDEPENDENT_AMBULATORY_CARE_PROVIDER_SITE_OTHER): Payer: Medicare Other | Admitting: Pharmacist

## 2014-08-09 DIAGNOSIS — I4892 Unspecified atrial flutter: Secondary | ICD-10-CM | POA: Diagnosis not present

## 2014-08-09 DIAGNOSIS — I639 Cerebral infarction, unspecified: Secondary | ICD-10-CM

## 2014-08-09 LAB — POCT INR: INR: 4.1

## 2014-08-14 DIAGNOSIS — H349 Unspecified retinal vascular occlusion: Secondary | ICD-10-CM | POA: Diagnosis not present

## 2014-08-23 ENCOUNTER — Ambulatory Visit (INDEPENDENT_AMBULATORY_CARE_PROVIDER_SITE_OTHER): Payer: Medicare Other | Admitting: Pharmacist

## 2014-08-23 DIAGNOSIS — I639 Cerebral infarction, unspecified: Secondary | ICD-10-CM

## 2014-08-23 DIAGNOSIS — I4892 Unspecified atrial flutter: Secondary | ICD-10-CM | POA: Diagnosis not present

## 2014-08-23 LAB — POCT INR: INR: 3

## 2014-08-31 ENCOUNTER — Other Ambulatory Visit: Payer: Self-pay | Admitting: Family Medicine

## 2014-09-04 NOTE — Telephone Encounter (Signed)
Pt was last seen here in office on 07/03/14.

## 2014-09-05 NOTE — Telephone Encounter (Signed)
Can dispense  30 # pcp NA  Dr Sarajane Jews can rx further refills .

## 2014-09-15 ENCOUNTER — Ambulatory Visit (INDEPENDENT_AMBULATORY_CARE_PROVIDER_SITE_OTHER): Payer: Medicare Other | Admitting: *Deleted

## 2014-09-15 DIAGNOSIS — I4892 Unspecified atrial flutter: Secondary | ICD-10-CM | POA: Diagnosis not present

## 2014-09-15 DIAGNOSIS — I639 Cerebral infarction, unspecified: Secondary | ICD-10-CM | POA: Diagnosis not present

## 2014-09-15 LAB — POCT INR: INR: 3.2

## 2014-09-21 ENCOUNTER — Other Ambulatory Visit (HOSPITAL_COMMUNITY): Payer: Self-pay | Admitting: Cardiology

## 2014-09-21 DIAGNOSIS — I6523 Occlusion and stenosis of bilateral carotid arteries: Secondary | ICD-10-CM

## 2014-09-25 ENCOUNTER — Other Ambulatory Visit: Payer: Self-pay

## 2014-09-25 NOTE — Telephone Encounter (Signed)
Rx request for alprazolam 1 mg tablet.  Pt given partial rx when Dr. Sarajane Jews was out of the office on 3.29.2016.

## 2014-09-26 MED ORDER — ALPRAZOLAM 1 MG PO TABS: ORAL_TABLET | ORAL | Status: DC

## 2014-09-26 NOTE — Telephone Encounter (Signed)
Call in #60 with 5 rf 

## 2014-09-27 ENCOUNTER — Ambulatory Visit (HOSPITAL_COMMUNITY): Payer: Medicare Other | Attending: Internal Medicine

## 2014-09-27 DIAGNOSIS — E785 Hyperlipidemia, unspecified: Secondary | ICD-10-CM | POA: Insufficient documentation

## 2014-09-27 DIAGNOSIS — I6523 Occlusion and stenosis of bilateral carotid arteries: Secondary | ICD-10-CM | POA: Insufficient documentation

## 2014-09-27 DIAGNOSIS — Z8673 Personal history of transient ischemic attack (TIA), and cerebral infarction without residual deficits: Secondary | ICD-10-CM | POA: Insufficient documentation

## 2014-09-27 DIAGNOSIS — I2510 Atherosclerotic heart disease of native coronary artery without angina pectoris: Secondary | ICD-10-CM | POA: Diagnosis not present

## 2014-09-27 DIAGNOSIS — I10 Essential (primary) hypertension: Secondary | ICD-10-CM | POA: Insufficient documentation

## 2014-09-27 DIAGNOSIS — Z95811 Presence of heart assist device: Secondary | ICD-10-CM | POA: Diagnosis not present

## 2014-09-27 NOTE — Progress Notes (Signed)
Carotid duplex scan performed 

## 2014-10-09 ENCOUNTER — Encounter: Payer: Self-pay | Admitting: Internal Medicine

## 2014-10-09 NOTE — Telephone Encounter (Signed)
New message      Returning [name]'s call to get results on a test

## 2014-10-10 NOTE — Telephone Encounter (Signed)
This encounter was created in error - please disregard.

## 2014-10-17 DIAGNOSIS — M5136 Other intervertebral disc degeneration, lumbar region: Secondary | ICD-10-CM | POA: Diagnosis not present

## 2014-10-17 DIAGNOSIS — Z6830 Body mass index (BMI) 30.0-30.9, adult: Secondary | ICD-10-CM | POA: Diagnosis not present

## 2014-10-17 DIAGNOSIS — M4806 Spinal stenosis, lumbar region: Secondary | ICD-10-CM | POA: Diagnosis not present

## 2014-10-25 ENCOUNTER — Ambulatory Visit (INDEPENDENT_AMBULATORY_CARE_PROVIDER_SITE_OTHER): Payer: Medicare Other | Admitting: *Deleted

## 2014-10-25 DIAGNOSIS — I4892 Unspecified atrial flutter: Secondary | ICD-10-CM

## 2014-10-25 DIAGNOSIS — I639 Cerebral infarction, unspecified: Secondary | ICD-10-CM | POA: Diagnosis not present

## 2014-10-25 LAB — POCT INR: INR: 3.4

## 2014-10-30 ENCOUNTER — Other Ambulatory Visit: Payer: Self-pay | Admitting: Internal Medicine

## 2014-10-31 DIAGNOSIS — Z6830 Body mass index (BMI) 30.0-30.9, adult: Secondary | ICD-10-CM | POA: Diagnosis not present

## 2014-10-31 DIAGNOSIS — M4807 Spinal stenosis, lumbosacral region: Secondary | ICD-10-CM | POA: Diagnosis not present

## 2014-10-31 DIAGNOSIS — M4806 Spinal stenosis, lumbar region: Secondary | ICD-10-CM | POA: Diagnosis not present

## 2014-10-31 DIAGNOSIS — M5136 Other intervertebral disc degeneration, lumbar region: Secondary | ICD-10-CM | POA: Diagnosis not present

## 2014-10-31 NOTE — Telephone Encounter (Signed)
Per note 11.18.15

## 2014-11-01 ENCOUNTER — Encounter: Payer: Self-pay | Admitting: Internal Medicine

## 2014-11-01 ENCOUNTER — Ambulatory Visit (INDEPENDENT_AMBULATORY_CARE_PROVIDER_SITE_OTHER): Payer: Medicare Other | Admitting: Internal Medicine

## 2014-11-01 VITALS — BP 122/70 | HR 86 | Ht 66.5 in | Wt 191.2 lb

## 2014-11-01 DIAGNOSIS — I482 Chronic atrial fibrillation: Secondary | ICD-10-CM | POA: Diagnosis not present

## 2014-11-01 DIAGNOSIS — I639 Cerebral infarction, unspecified: Secondary | ICD-10-CM

## 2014-11-01 DIAGNOSIS — I4821 Permanent atrial fibrillation: Secondary | ICD-10-CM

## 2014-11-01 NOTE — Patient Instructions (Signed)
Medication Instructions:  Your physician recommends that you continue on your current medications as directed. Please refer to the Current Medication list given to you today.  Labwork: None ordered  Testing/Procedures: None ordered  Follow-Up: Your physician wants you to follow-up in: 1 year with Dr. Klein.  You will receive a reminder letter in the mail two months in advance. If you don't receive a letter, please call our office to schedule the follow-up appointment.   Thank you for choosing [facility]!!          

## 2014-11-01 NOTE — Progress Notes (Signed)
Patient Care Team: Laurey Morale, MD as PCP - General   HPI  [name] is a 81 y.o. male seen in followup of atrial flutter and a recent stroke manifesting as central retinal artery occlusion. 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 His INR that day was 2.5   His target INR is 2.5--3.5.  Shortness of breath improved with the addition of a diuretic. He still has some exertional fatigue particularly with bending.  He has a history of bypass surgery and mitral valve repair in 2007. Myoview 8/14 demonstrated an ejection fraction 52%. There is a large evidence of scar and inferolateral wall.      Past Medical History  Diagnosis Date  . Chickenpox   . Blood transfusion abn reaction or complication, no procedure mishap   . History of colonic polyps     adenomatous  . Coronary artery disease-s/p CABG and MV annuloplasty   . Hyperlipidemia   . Hypertension   . Urinary incontinence   . Onychomycosis   . Central retinal artery occlusion--CVA and   . Atrial fibrillation // Atrial flutter   . History of blood clots   . GI bleed     after colonoscopy/polyp removal  . Anemia, posthemorrhagic, acute   . Prostate cancer   . 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 Insomnia   . Arthritis     Hands  . 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 Depression     Past Surgical History  Procedure Laterality Date  . Mitral valve replacement  2007  . Radical prostatectomy with radiation  2003  . A flutter ablation  2010  . Bladder pump installed  2011    Dr. McDiarmid  . Colonoscopy w/ biopsies and polypectomy  07-14-11    per Dr. Carlean Purl, adenomatous polyps, repeat in 3 yrs   . Inguinal hernia repair  1993    bilatera  . Colonoscopy  06/23/2011   Procedure: COLONOSCOPY;  Surgeon: Gatha Mayer, MD;  Location: [facility];  Service: Endoscopy;  Laterality: N/A;  . Mitral valve ring placement      Per pt he did not have a mitral valve replacement  . Coronary artery bypass graft  1989    4 vessels  . Coronary artery bypass graft  2007    and mitral valve replacement   . Eye surgery      Catarat with Lens Bil  . Cancer of right thumb nail    . Total knee arthroplasty  08/27/2011    Procedure: TOTAL KNEE ARTHROPLASTY;  Surgeon: Ninetta Lights, MD;  Location: [place];  Service: Orthopedics;  Laterality: Left;  90 MINUTES FOR SURGERY  . Carpal tunnel release Right 03/01/2013    Procedure: RIGHT CARPAL TUNNEL RELEASE;  Surgeon: Tennis Must, MD;  Location: [place];  Service: Orthopedics;  Laterality: Right;    Current Outpatient Prescriptions  Medication Sig Dispense Refill  . ALPRAZolam (XANAX) 1 MG tablet Take 2 mg by mouth at bedtime as needed for anxiety or sleep.    Marland Kitchen atorvastatin (LIPITOR) 40 MG tablet TAKE 1 TABLET BY MOUTH DAILY. 90 tablet 1  . chlorthalidone (HYGROTON) 25 MG tablet Take 1 tablet (25  mg total) by mouth daily. 90 tablet 3  . methocarbamol (ROBAXIN) 500 MG tablet Take 1 tablet (500 mg total) by mouth every 6 (six) hours as needed for muscle spasms. 60 tablet 11  . metoprolol tartrate (LOPRESSOR) 25 MG tablet TAKE 1/2 TABLET BY MOUTH DAILY. 30 tablet 6  . PARoxetine (PAXIL) 20 MG tablet TAKE 1 TABLET (20 MG TOTAL) BY MOUTH DAILY. 30 tablet 11  . traMADol (ULTRAM) 50 MG tablet Take 50 mg by mouth 2 (two) times daily as needed for moderate pain or severe pain.     Marland Kitchen triamcinolone cream (KENALOG) 0.1 % Apply 1 application topically 2 (two) times daily as needed (for itching).    . warfarin (COUMADIN) 5 MG tablet TAKE AS DIRECTED BY COUMADIN CLINIC 45 tablet 3   No current facility-administered medications for this visit.    Allergies  Allergen Reactions  . Lisinopril Cough  . Niacin      contraindication    Review of Systems negative except from HPI and PMH  Physical Exam BP 122/70 mmHg  Pulse 86  Ht 5' 6.5" (1.689 m)  Wt 191 lb 3.2 oz (86.728 kg)  BMI 30.40 kg/m2 Well developed and well nourished in no acute distress HENT normal E scleral and icterus clear Neck Supple JVP flat; carotids brisk and full Clear on R and decreased on left IrRegular rate and rhythm, no murmurs gallops or rub Soft with active bowel sounds No clubbing cyanosis none Edema Alert and oriented, grossly normal motor and sensory function Skin Warm and Dry  ECG demonstrates atrial fibrillation 77 intervals-/09/36 Low-voltage  Assessment and  Plan  Atrial fibrillation-permanent  Dyspnea on exertion improved  Coronary artery disease with prior bypass/mitral valve repair; prior inferior wall MI-EF 52%    Will continue diuretic and increase to chlorthalidone 25   Metabolic profile demonstrated potassium of 4.1 on 12/15  The patient has had prior strokes.  He is to undergo needle between for pain in his back; apparently anticoagulation does not need to be held. We had a lengthy discussion regarding anticoagulation and its role with ablation. I reiterated that ablation for atrial fibrillation is undertaken for symptomatic atrial fibrillation and not to inform the decision regarding anticoagulation. We also discussed the watchman device as an alternative anticoagulation. He will wait until he returns to [place] which will happen in one year and he will follow-up with the [facility] clinic.  We spent more than 50% of our >25 min visit in face to face counseling regarding the above

## 2014-11-08 DIAGNOSIS — M47816 Spondylosis without myelopathy or radiculopathy, lumbar region: Secondary | ICD-10-CM | POA: Diagnosis not present

## 2014-11-20 ENCOUNTER — Other Ambulatory Visit: Payer: Self-pay | Admitting: Internal Medicine

## 2014-11-21 ENCOUNTER — Ambulatory Visit: Payer: Medicare Other | Admitting: Family Medicine

## 2014-11-22 ENCOUNTER — Ambulatory Visit (INDEPENDENT_AMBULATORY_CARE_PROVIDER_SITE_OTHER): Payer: Medicare Other | Admitting: *Deleted

## 2014-11-22 DIAGNOSIS — I4892 Unspecified atrial flutter: Secondary | ICD-10-CM

## 2014-11-22 DIAGNOSIS — I639 Cerebral infarction, unspecified: Secondary | ICD-10-CM

## 2014-11-22 LAB — POCT INR: INR: 3.2

## 2014-11-27 DIAGNOSIS — M9903 Segmental and somatic dysfunction of lumbar region: Secondary | ICD-10-CM | POA: Diagnosis not present

## 2014-11-27 DIAGNOSIS — M9901 Segmental and somatic dysfunction of cervical region: Secondary | ICD-10-CM | POA: Diagnosis not present

## 2014-11-28 DIAGNOSIS — M4806 Spinal stenosis, lumbar region: Secondary | ICD-10-CM | POA: Diagnosis not present

## 2014-12-01 DIAGNOSIS — M9903 Segmental and somatic dysfunction of lumbar region: Secondary | ICD-10-CM | POA: Diagnosis not present

## 2014-12-01 DIAGNOSIS — M9901 Segmental and somatic dysfunction of cervical region: Secondary | ICD-10-CM | POA: Diagnosis not present

## 2014-12-13 DIAGNOSIS — M9903 Segmental and somatic dysfunction of lumbar region: Secondary | ICD-10-CM | POA: Diagnosis not present

## 2014-12-13 DIAGNOSIS — M9901 Segmental and somatic dysfunction of cervical region: Secondary | ICD-10-CM | POA: Diagnosis not present

## 2014-12-15 DIAGNOSIS — M9901 Segmental and somatic dysfunction of cervical region: Secondary | ICD-10-CM | POA: Diagnosis not present

## 2014-12-15 DIAGNOSIS — M9903 Segmental and somatic dysfunction of lumbar region: Secondary | ICD-10-CM | POA: Diagnosis not present

## 2014-12-18 DIAGNOSIS — M9903 Segmental and somatic dysfunction of lumbar region: Secondary | ICD-10-CM | POA: Diagnosis not present

## 2014-12-18 DIAGNOSIS — M9901 Segmental and somatic dysfunction of cervical region: Secondary | ICD-10-CM | POA: Diagnosis not present

## 2014-12-22 DIAGNOSIS — M9903 Segmental and somatic dysfunction of lumbar region: Secondary | ICD-10-CM | POA: Diagnosis not present

## 2014-12-22 DIAGNOSIS — M9901 Segmental and somatic dysfunction of cervical region: Secondary | ICD-10-CM | POA: Diagnosis not present

## 2014-12-25 DIAGNOSIS — M9903 Segmental and somatic dysfunction of lumbar region: Secondary | ICD-10-CM | POA: Diagnosis not present

## 2014-12-25 DIAGNOSIS — M9901 Segmental and somatic dysfunction of cervical region: Secondary | ICD-10-CM | POA: Diagnosis not present

## 2014-12-29 DIAGNOSIS — M5023 Other cervical disc displacement, cervicothoracic region: Secondary | ICD-10-CM | POA: Diagnosis not present

## 2014-12-29 DIAGNOSIS — M542 Cervicalgia: Secondary | ICD-10-CM | POA: Diagnosis not present

## 2015-01-01 DIAGNOSIS — M9901 Segmental and somatic dysfunction of cervical region: Secondary | ICD-10-CM | POA: Diagnosis not present

## 2015-01-01 DIAGNOSIS — M9903 Segmental and somatic dysfunction of lumbar region: Secondary | ICD-10-CM | POA: Diagnosis not present

## 2015-01-03 ENCOUNTER — Ambulatory Visit (INDEPENDENT_AMBULATORY_CARE_PROVIDER_SITE_OTHER): Payer: Medicare Other | Admitting: Pharmacist

## 2015-01-03 DIAGNOSIS — I639 Cerebral infarction, unspecified: Secondary | ICD-10-CM | POA: Diagnosis not present

## 2015-01-03 DIAGNOSIS — I4892 Unspecified atrial flutter: Secondary | ICD-10-CM

## 2015-01-03 LAB — POCT INR: INR: 2.8

## 2015-01-05 DIAGNOSIS — M9903 Segmental and somatic dysfunction of lumbar region: Secondary | ICD-10-CM | POA: Diagnosis not present

## 2015-01-05 DIAGNOSIS — M9901 Segmental and somatic dysfunction of cervical region: Secondary | ICD-10-CM | POA: Diagnosis not present

## 2015-01-05 DIAGNOSIS — M5023 Other cervical disc displacement, cervicothoracic region: Secondary | ICD-10-CM | POA: Diagnosis not present

## 2015-01-05 DIAGNOSIS — M4802 Spinal stenosis, cervical region: Secondary | ICD-10-CM | POA: Diagnosis not present

## 2015-01-09 DIAGNOSIS — M5023 Other cervical disc displacement, cervicothoracic region: Secondary | ICD-10-CM | POA: Diagnosis not present

## 2015-01-15 DIAGNOSIS — M9901 Segmental and somatic dysfunction of cervical region: Secondary | ICD-10-CM | POA: Diagnosis not present

## 2015-01-15 DIAGNOSIS — M9903 Segmental and somatic dysfunction of lumbar region: Secondary | ICD-10-CM | POA: Diagnosis not present

## 2015-01-26 DIAGNOSIS — M9901 Segmental and somatic dysfunction of cervical region: Secondary | ICD-10-CM | POA: Diagnosis not present

## 2015-01-26 DIAGNOSIS — M9903 Segmental and somatic dysfunction of lumbar region: Secondary | ICD-10-CM | POA: Diagnosis not present

## 2015-01-31 ENCOUNTER — Ambulatory Visit (INDEPENDENT_AMBULATORY_CARE_PROVIDER_SITE_OTHER): Payer: Medicare Other | Admitting: Pharmacist

## 2015-01-31 DIAGNOSIS — I4892 Unspecified atrial flutter: Secondary | ICD-10-CM

## 2015-01-31 DIAGNOSIS — I639 Cerebral infarction, unspecified: Secondary | ICD-10-CM | POA: Diagnosis not present

## 2015-01-31 LAB — POCT INR: INR: 3.3

## 2015-02-07 DIAGNOSIS — M9903 Segmental and somatic dysfunction of lumbar region: Secondary | ICD-10-CM | POA: Diagnosis not present

## 2015-02-07 DIAGNOSIS — M9901 Segmental and somatic dysfunction of cervical region: Secondary | ICD-10-CM | POA: Diagnosis not present

## 2015-02-16 ENCOUNTER — Other Ambulatory Visit: Payer: Self-pay | Admitting: Family Medicine

## 2015-02-19 DIAGNOSIS — M9901 Segmental and somatic dysfunction of cervical region: Secondary | ICD-10-CM | POA: Diagnosis not present

## 2015-02-19 DIAGNOSIS — M9903 Segmental and somatic dysfunction of lumbar region: Secondary | ICD-10-CM | POA: Diagnosis not present

## 2015-03-05 ENCOUNTER — Ambulatory Visit (INDEPENDENT_AMBULATORY_CARE_PROVIDER_SITE_OTHER): Payer: Medicare Other | Admitting: *Deleted

## 2015-03-05 DIAGNOSIS — I639 Cerebral infarction, unspecified: Secondary | ICD-10-CM

## 2015-03-05 DIAGNOSIS — I4892 Unspecified atrial flutter: Secondary | ICD-10-CM | POA: Diagnosis not present

## 2015-03-05 DIAGNOSIS — M47816 Spondylosis without myelopathy or radiculopathy, lumbar region: Secondary | ICD-10-CM | POA: Diagnosis not present

## 2015-03-05 LAB — POCT INR: INR: 2.5

## 2015-03-26 ENCOUNTER — Ambulatory Visit (INDEPENDENT_AMBULATORY_CARE_PROVIDER_SITE_OTHER): Payer: Medicare Other | Admitting: *Deleted

## 2015-03-26 DIAGNOSIS — I4892 Unspecified atrial flutter: Secondary | ICD-10-CM

## 2015-03-26 DIAGNOSIS — I639 Cerebral infarction, unspecified: Secondary | ICD-10-CM | POA: Diagnosis not present

## 2015-03-26 LAB — POCT INR: INR: 2.1

## 2015-04-04 ENCOUNTER — Other Ambulatory Visit: Payer: Self-pay | Admitting: Internal Medicine

## 2015-04-04 ENCOUNTER — Ambulatory Visit (INDEPENDENT_AMBULATORY_CARE_PROVIDER_SITE_OTHER): Payer: Medicare Other | Admitting: *Deleted

## 2015-04-04 DIAGNOSIS — I639 Cerebral infarction, unspecified: Secondary | ICD-10-CM

## 2015-04-04 DIAGNOSIS — I4892 Unspecified atrial flutter: Secondary | ICD-10-CM | POA: Diagnosis not present

## 2015-04-04 DIAGNOSIS — M47816 Spondylosis without myelopathy or radiculopathy, lumbar region: Secondary | ICD-10-CM | POA: Diagnosis not present

## 2015-04-04 LAB — POCT INR: INR: 2.9

## 2015-04-09 ENCOUNTER — Telehealth: Payer: Self-pay

## 2015-04-09 ENCOUNTER — Other Ambulatory Visit: Payer: Self-pay | Admitting: Family Medicine

## 2015-04-09 MED ORDER — ALPRAZOLAM 1 MG PO TABS: 2.0000 mg | ORAL_TABLET | Freq: Every evening | ORAL | Status: DC | PRN

## 2015-04-09 NOTE — Telephone Encounter (Signed)
Call in #60 with 5 rf 

## 2015-04-09 NOTE — Telephone Encounter (Signed)
[facility] requesting refill on Alprazolam 1mg  #60 with 5 refills. Pt last visit:07/03/2014 Last refill: 09/26/14 with 5 refills

## 2015-04-09 NOTE — Telephone Encounter (Signed)
Rx was call in  

## 2015-04-19 ENCOUNTER — Ambulatory Visit (INDEPENDENT_AMBULATORY_CARE_PROVIDER_SITE_OTHER): Payer: Medicare Other | Admitting: Pharmacist

## 2015-04-19 DIAGNOSIS — I4892 Unspecified atrial flutter: Secondary | ICD-10-CM

## 2015-04-19 DIAGNOSIS — I639 Cerebral infarction, unspecified: Secondary | ICD-10-CM | POA: Diagnosis not present

## 2015-04-19 LAB — POCT INR: INR: 3.6

## 2015-05-07 ENCOUNTER — Other Ambulatory Visit: Payer: Self-pay | Admitting: Internal Medicine

## 2015-05-08 ENCOUNTER — Other Ambulatory Visit: Payer: Self-pay | Admitting: Internal Medicine

## 2015-05-08 NOTE — Telephone Encounter (Signed)
Recent lipid panel by Dr Sarajane Jews. Ok to refill or refuse and have pharmacy sent to Dr Sarajane Jews? Please advise. Thanks, MI

## 2015-05-08 NOTE — Telephone Encounter (Signed)
Agreed - have Dr. Barbie Banner office fill. Thanks

## 2015-05-10 ENCOUNTER — Ambulatory Visit (INDEPENDENT_AMBULATORY_CARE_PROVIDER_SITE_OTHER): Payer: Medicare Other | Admitting: Surgery

## 2015-05-10 DIAGNOSIS — I4892 Unspecified atrial flutter: Secondary | ICD-10-CM | POA: Diagnosis not present

## 2015-05-10 DIAGNOSIS — I639 Cerebral infarction, unspecified: Secondary | ICD-10-CM | POA: Diagnosis not present

## 2015-05-10 LAB — POCT INR: INR: 3.8

## 2015-05-14 ENCOUNTER — Other Ambulatory Visit: Payer: Self-pay | Admitting: Internal Medicine

## 2015-05-14 NOTE — Telephone Encounter (Addendum)
New message      *STAT* If patient is at the pharmacy, call can be transferred to refill team.   1. Which medications need to be refilled? (please list name of each medication and dose if known) lipitor 40mg   2. Which pharmacy/location (including street and city if local pharmacy) is medication to be sent to? [facility]/[address]. Do they need a 30 day or 90 day supply? 90 day supply---------pt is out of medication

## 2015-05-15 ENCOUNTER — Other Ambulatory Visit: Payer: Self-pay

## 2015-05-15 MED ORDER — ATORVASTATIN CALCIUM 40 MG PO TABS: 40.0000 mg | ORAL_TABLET | Freq: Every day | ORAL | Status: DC

## 2015-05-15 NOTE — Telephone Encounter (Signed)
Deboraha Sprang, MD at 11/01/2014 4:54 PM  atorvastatin (LIPITOR) 40 MG tablet TAKE 1 TABLET BY MOUTH DAILY Medication Instructions:  Your physician recommends that you continue on your current medications as directed. Please refer to the Current Medication list given to you today.

## 2015-05-24 ENCOUNTER — Ambulatory Visit (INDEPENDENT_AMBULATORY_CARE_PROVIDER_SITE_OTHER): Payer: Medicare Other | Admitting: *Deleted

## 2015-05-24 DIAGNOSIS — I4892 Unspecified atrial flutter: Secondary | ICD-10-CM | POA: Diagnosis not present

## 2015-05-24 DIAGNOSIS — I639 Cerebral infarction, unspecified: Secondary | ICD-10-CM | POA: Diagnosis not present

## 2015-05-24 LAB — POCT INR: INR: 4.3

## 2015-05-24 MED ORDER — WARFARIN SODIUM 1 MG PO TABS: ORAL_TABLET | ORAL | Status: DC

## 2015-06-05 ENCOUNTER — Ambulatory Visit (INDEPENDENT_AMBULATORY_CARE_PROVIDER_SITE_OTHER): Payer: Medicare Other | Admitting: *Deleted

## 2015-06-05 DIAGNOSIS — I639 Cerebral infarction, unspecified: Secondary | ICD-10-CM

## 2015-06-05 DIAGNOSIS — I4892 Unspecified atrial flutter: Secondary | ICD-10-CM

## 2015-06-05 LAB — POCT INR: INR: 3

## 2015-06-25 ENCOUNTER — Other Ambulatory Visit: Payer: Self-pay | Admitting: Internal Medicine

## 2015-06-25 ENCOUNTER — Ambulatory Visit (INDEPENDENT_AMBULATORY_CARE_PROVIDER_SITE_OTHER): Payer: Medicare Other | Admitting: Pharmacist

## 2015-06-25 DIAGNOSIS — I639 Cerebral infarction, unspecified: Secondary | ICD-10-CM | POA: Diagnosis not present

## 2015-06-25 DIAGNOSIS — I4892 Unspecified atrial flutter: Secondary | ICD-10-CM

## 2015-06-25 LAB — POCT INR: INR: 3.4

## 2015-06-25 MED ORDER — WARFARIN SODIUM 5 MG PO TABS: ORAL_TABLET | ORAL | Status: DC

## 2015-07-09 DIAGNOSIS — M170 Bilateral primary osteoarthritis of knee: Secondary | ICD-10-CM | POA: Diagnosis not present

## 2015-07-16 DIAGNOSIS — M1711 Unilateral primary osteoarthritis, right knee: Secondary | ICD-10-CM | POA: Diagnosis not present

## 2015-07-16 DIAGNOSIS — M170 Bilateral primary osteoarthritis of knee: Secondary | ICD-10-CM | POA: Diagnosis not present

## 2015-07-23 ENCOUNTER — Ambulatory Visit (INDEPENDENT_AMBULATORY_CARE_PROVIDER_SITE_OTHER): Payer: Medicare Other | Admitting: *Deleted

## 2015-07-23 DIAGNOSIS — M170 Bilateral primary osteoarthritis of knee: Secondary | ICD-10-CM | POA: Diagnosis not present

## 2015-07-23 DIAGNOSIS — I639 Cerebral infarction, unspecified: Secondary | ICD-10-CM

## 2015-07-23 DIAGNOSIS — I4892 Unspecified atrial flutter: Secondary | ICD-10-CM | POA: Diagnosis not present

## 2015-07-23 LAB — POCT INR: INR: 2.9

## 2015-07-23 MED ORDER — WARFARIN SODIUM 1 MG PO TABS: ORAL_TABLET | ORAL | Status: DC

## 2015-08-17 ENCOUNTER — Encounter: Payer: Self-pay | Admitting: Family Medicine

## 2015-08-17 ENCOUNTER — Ambulatory Visit (INDEPENDENT_AMBULATORY_CARE_PROVIDER_SITE_OTHER): Payer: Medicare Other | Admitting: Family Medicine

## 2015-08-17 ENCOUNTER — Ambulatory Visit (INDEPENDENT_AMBULATORY_CARE_PROVIDER_SITE_OTHER)
Admission: RE | Admit: 2015-08-17 | Discharge: 2015-08-17 | Disposition: A | Discharge status: Home / Self Care | Payer: Medicare Other | Source: Ambulatory Visit | Attending: Family Medicine | Admitting: Family Medicine

## 2015-08-17 VITALS — BP 126/75 | HR 76 | Temp 97.8°F | Ht 66.5 in | Wt 191.0 lb

## 2015-08-17 DIAGNOSIS — R053 Chronic cough: Secondary | ICD-10-CM

## 2015-08-17 DIAGNOSIS — I4892 Unspecified atrial flutter: Secondary | ICD-10-CM

## 2015-08-17 DIAGNOSIS — R05 Cough: Secondary | ICD-10-CM

## 2015-08-17 DIAGNOSIS — I639 Cerebral infarction, unspecified: Secondary | ICD-10-CM | POA: Diagnosis not present

## 2015-08-17 DIAGNOSIS — I10 Essential (primary) hypertension: Secondary | ICD-10-CM | POA: Diagnosis not present

## 2015-08-17 NOTE — Progress Notes (Signed)
   Subjective:    Patient ID: [name], male    DOB: Oct 27, 1933, 82 y.o.   MRN: [id number]  HPI Here to follow up. He complains of a chronic dry cough for the past 6 months. He had been taken off Lisinopril and the cough improved, however now it is worse again. No chest pain or SOB. His BP is stable.    Review of Systems  Constitutional: Negative.   Respiratory: Positive for cough. Negative for chest tightness, shortness of breath and wheezing.   Cardiovascular: Negative.   Neurological: Negative.        Objective:   Physical Exam  Constitutional: He is oriented to person, place, and time. He appears well-developed and well-nourished.  Neck: No thyromegaly present.  Cardiovascular: Normal rate, regular rhythm, normal heart sounds and intact distal pulses.   Pulmonary/Chest: Effort normal and breath sounds normal. No respiratory distress. He has no rales.  Musculoskeletal: He exhibits no edema.  Lymphadenopathy:    He has no cervical adenopathy.  Neurological: He is alert and oriented to person, place, and time.          Assessment & Plan:  He is doing well but he still has a cough. We will send him for a CXR today.

## 2015-08-17 NOTE — Progress Notes (Signed)
Pre visit review using our clinic review tool, if applicable. No additional management support is needed unless otherwise documented below in the visit note. 

## 2015-08-20 ENCOUNTER — Ambulatory Visit (INDEPENDENT_AMBULATORY_CARE_PROVIDER_SITE_OTHER): Payer: Medicare Other | Admitting: Pharmacist

## 2015-08-20 DIAGNOSIS — I4892 Unspecified atrial flutter: Secondary | ICD-10-CM

## 2015-08-20 DIAGNOSIS — I639 Cerebral infarction, unspecified: Secondary | ICD-10-CM

## 2015-08-20 LAB — POCT INR: INR: 2.9

## 2015-08-25 IMAGING — CT CT HEAD W/O CM
1 series · 16 of 30 positions shown, 20 images · non-contrast
Comparison: None.

CLINICAL DATA: Vision loss right eye

EXAM:
CT HEAD WITHOUT CONTRAST
TECHNIQUE: Contiguous axial images were obtained from the base of the skull
through the vertex without intravenous contrast.

[Series 2: head 5.0 h30s · axial · 0.45mm/px · z∈[-96,+44]mm · 16 of 32 slices shown, 20 images]
[im 2/32  brain]
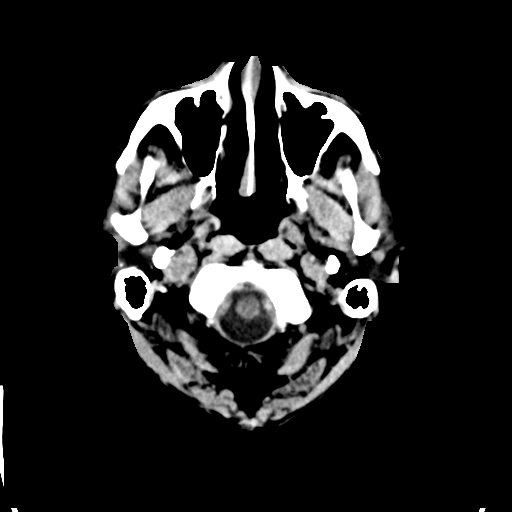
[im 2/32  bone]
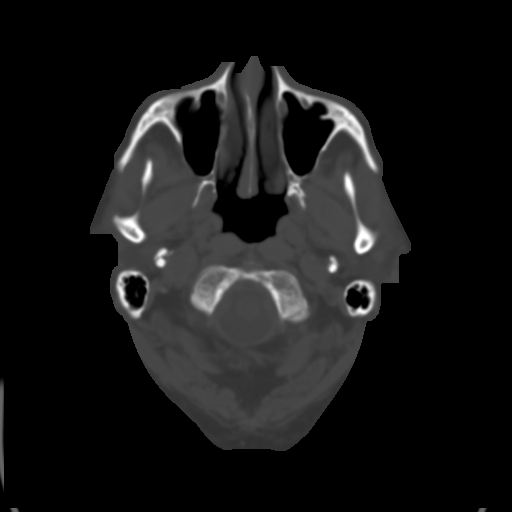
[im 4/32  brain]
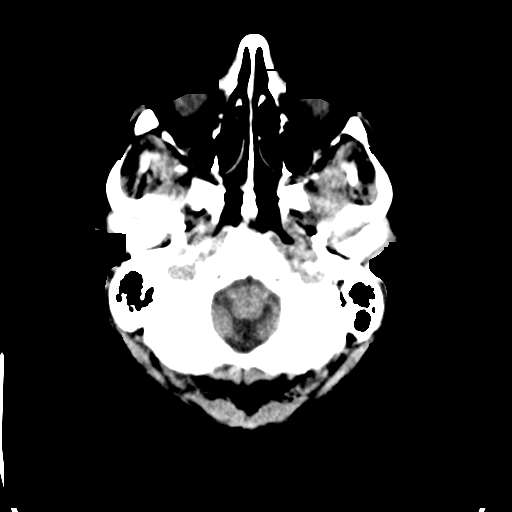
[im 6/32  brain]
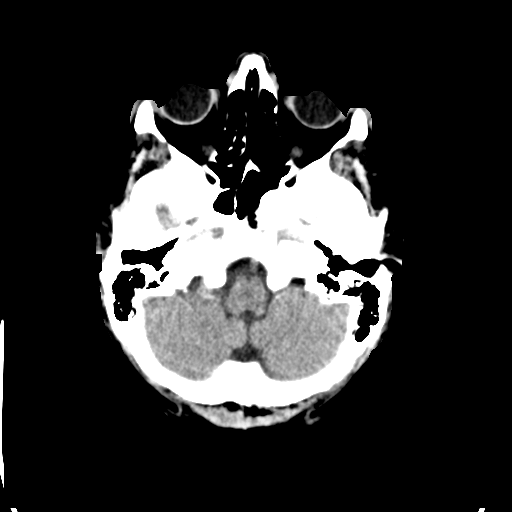
[im 8/32  brain]
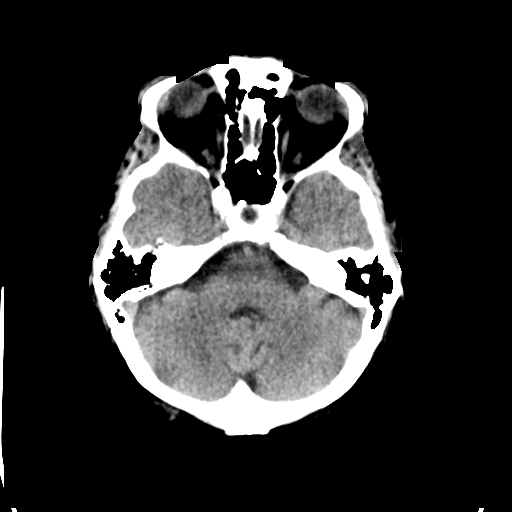
[im 9/32  brain]
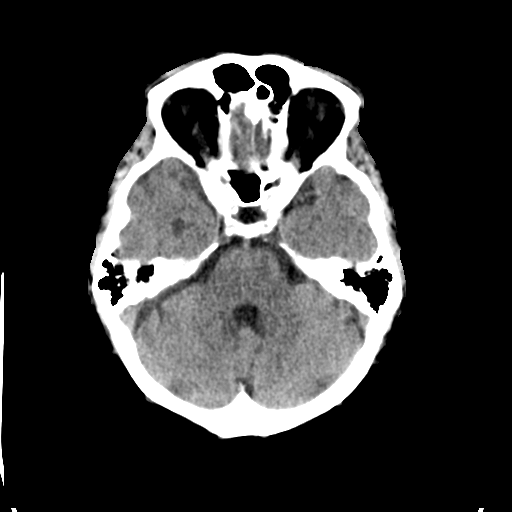
[im 9/32  bone]
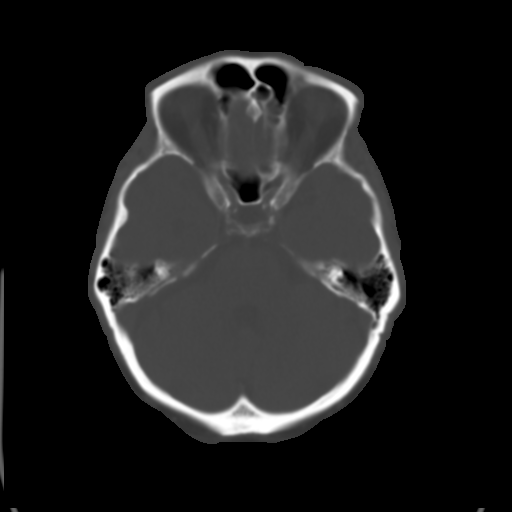
[im 11/32  brain]
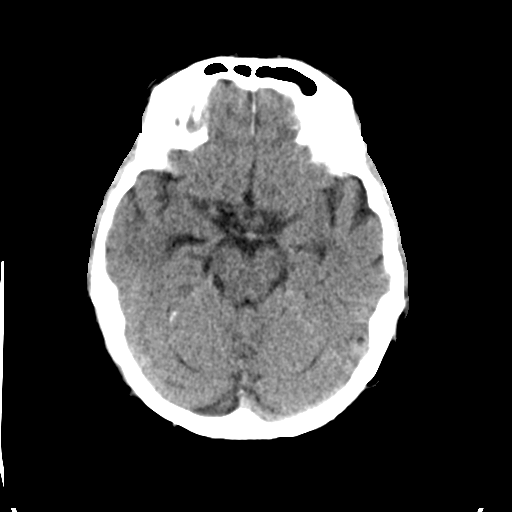
[im 13/32  brain]
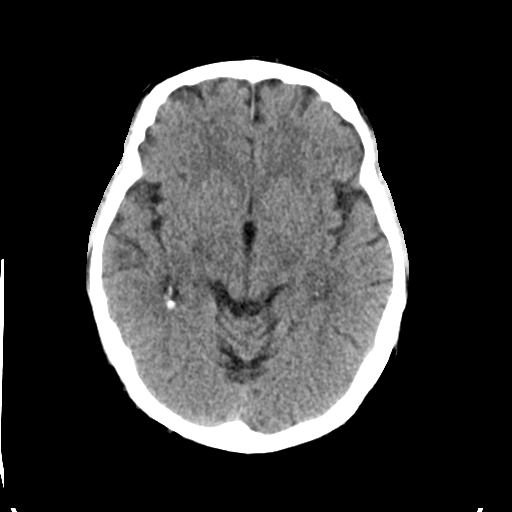
[im 15/32  brain]
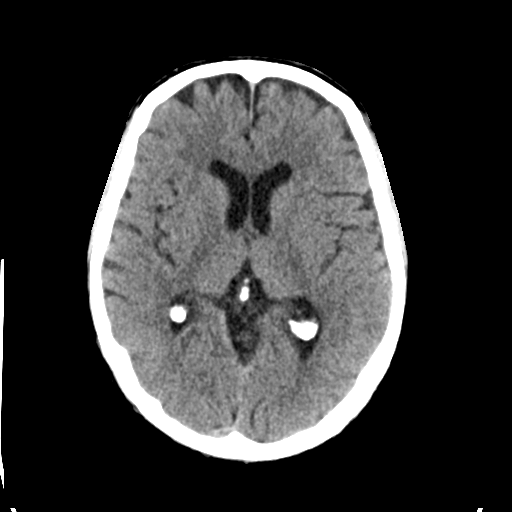
[im 17/32  brain]
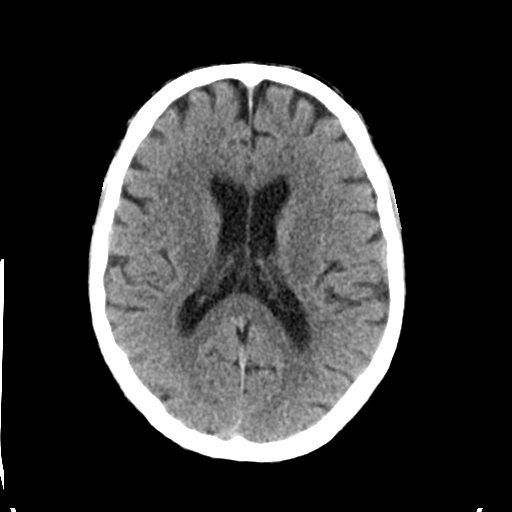
[im 17/32  bone]
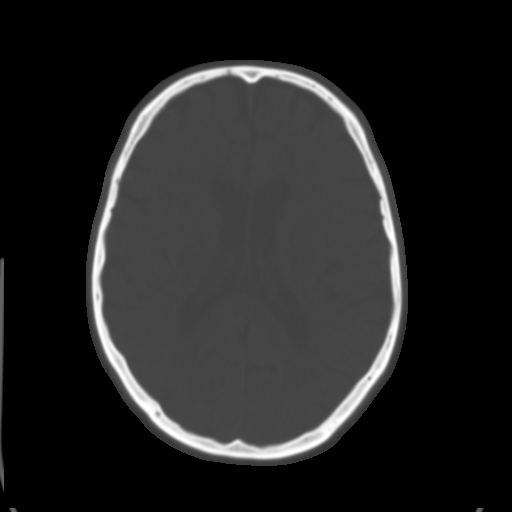
[im 19/32  brain]
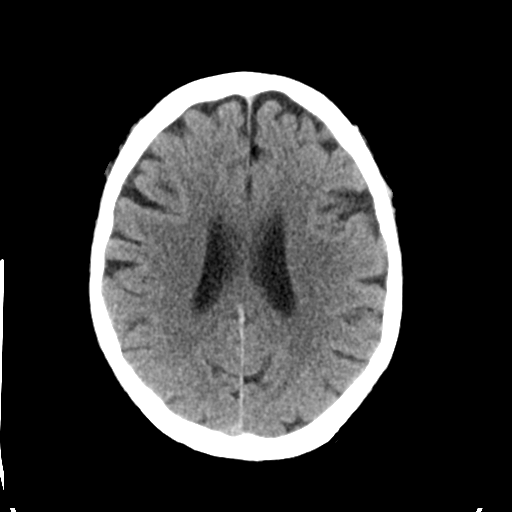
[im 21/32  brain]
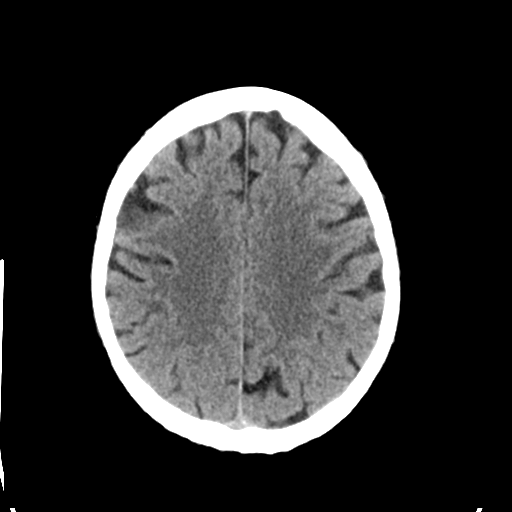
[im 23/32  brain]
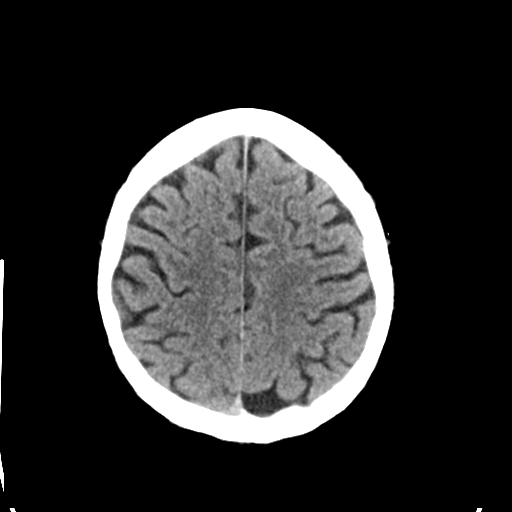
[im 24/32  brain]
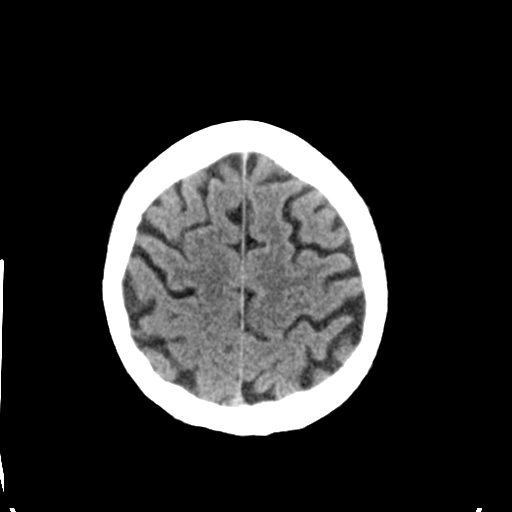
[im 24/32  bone]
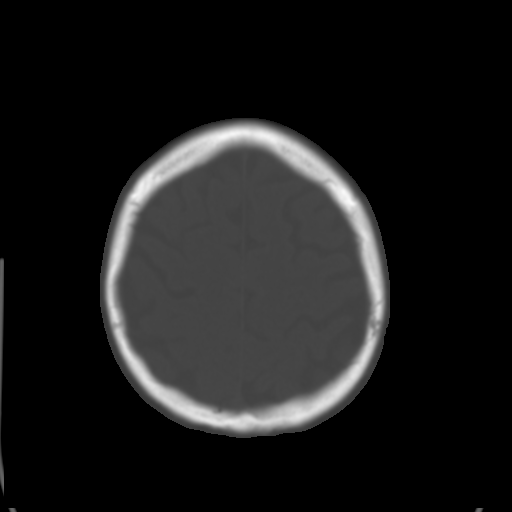
[im 26/32  brain]
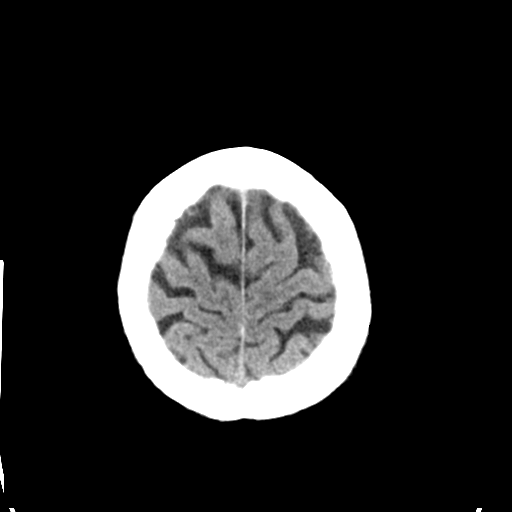
[im 28/32  brain]
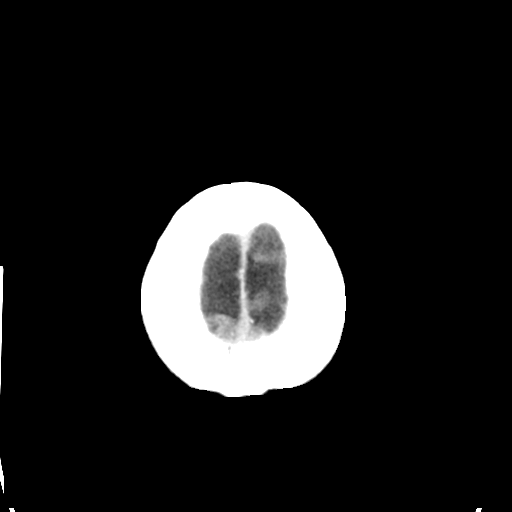
[im 30/32  brain]
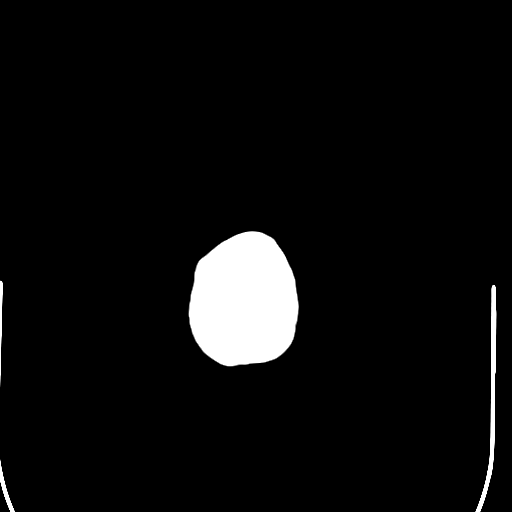

[16 of 30 positions shown; findings below may reference images not displayed]

FINDINGS: No mass. No hydrocephalus. No hemorrhage. No acute intracranial
abnormality identified. No acute bony abnormality identified.
IMPRESSION: No acute abnormality.

## 2015-08-28 NOTE — Addendum Note (Signed)
Addended by: Alysia Penna A on: 08/28/2015 07:00 AM   Modules accepted: Orders

## 2015-10-02 ENCOUNTER — Institutional Professional Consult (permissible substitution): Payer: Medicare Other | Admitting: Pulmonary Disease

## 2015-10-08 ENCOUNTER — Ambulatory Visit (INDEPENDENT_AMBULATORY_CARE_PROVIDER_SITE_OTHER): Payer: Medicare Other | Admitting: Pharmacist

## 2015-10-08 ENCOUNTER — Telehealth: Payer: Self-pay | Admitting: *Deleted

## 2015-10-08 DIAGNOSIS — I4892 Unspecified atrial flutter: Secondary | ICD-10-CM

## 2015-10-08 DIAGNOSIS — I639 Cerebral infarction, unspecified: Secondary | ICD-10-CM | POA: Diagnosis not present

## 2015-10-08 LAB — POCT INR: INR: 3

## 2015-10-08 MED ORDER — WARFARIN SODIUM 1 MG PO TABS: ORAL_TABLET | ORAL | Status: DC

## 2015-10-08 MED ORDER — ALPRAZOLAM 1 MG PO TABS: 2.0000 mg | ORAL_TABLET | Freq: Every evening | ORAL | Status: AC | PRN

## 2015-10-08 NOTE — Telephone Encounter (Signed)
I called in script 

## 2015-10-08 NOTE — Telephone Encounter (Signed)
Call in #60 with 5 rf 

## 2015-10-08 NOTE — Telephone Encounter (Signed)
[facility] [address], [place] rx refill ALPRAZolam (XANAX) 1 MG tablet

## 2015-11-08 ENCOUNTER — Other Ambulatory Visit: Payer: Self-pay | Admitting: Internal Medicine

## 2015-11-14 ENCOUNTER — Ambulatory Visit (INDEPENDENT_AMBULATORY_CARE_PROVIDER_SITE_OTHER): Payer: Medicare Other | Admitting: *Deleted

## 2015-11-14 DIAGNOSIS — I4892 Unspecified atrial flutter: Secondary | ICD-10-CM | POA: Diagnosis not present

## 2015-11-14 DIAGNOSIS — I639 Cerebral infarction, unspecified: Secondary | ICD-10-CM

## 2015-11-14 LAB — POCT INR: INR: 3.6

## 2015-11-19 ENCOUNTER — Other Ambulatory Visit: Payer: Self-pay | Admitting: Internal Medicine

## 2015-12-06 ENCOUNTER — Telehealth: Payer: Self-pay | Admitting: *Deleted

## 2015-12-06 NOTE — Telephone Encounter (Signed)
Called pt in reference to having INR being overdue & he stated he has not had a chance to go & have his INR drawn at a lab because he was still unpacking items from moving to [place]. Stressed the importance of having his INR rechecked & the purpose of the order to have it done.  He is aware that this is his last INR check with [place] & reminded him that he will need to find a Cardiologist in [place] to follow up with afterwards as he planned to do so.  Also, the pt stated he would go next week to have INR drawn because he is really busy this week.  Again, reminded him of the importance of getting INR checked soon as he can & he stated the earliet he could would be next week. Instructed pt that we would follow up next week.

## 2015-12-07 ENCOUNTER — Telehealth: Payer: Self-pay | Admitting: *Deleted

## 2015-12-07 DIAGNOSIS — Z7901 Long term (current) use of anticoagulants: Secondary | ICD-10-CM | POA: Diagnosis not present

## 2015-12-07 LAB — PROTIME-INR: INR: 3.7 — AB (ref 0.9–1.1)

## 2015-12-07 NOTE — Telephone Encounter (Signed)
Pt called to inform us that he found his order to have his INR done & would go to the lab to have it done next week.  Also, he stated after it was done he would need his Coumadin/Warfairn refilled & sent to [facility].  He stated he has enough medication at this time but will need some until he finds a Manufacturing engineer in [place]. Advised to call back with any other questions or issues.

## 2015-12-10 ENCOUNTER — Ambulatory Visit (INDEPENDENT_AMBULATORY_CARE_PROVIDER_SITE_OTHER): Payer: Medicare Other | Admitting: Internal Medicine

## 2015-12-10 DIAGNOSIS — I4892 Unspecified atrial flutter: Secondary | ICD-10-CM

## 2015-12-15 ENCOUNTER — Other Ambulatory Visit: Payer: Self-pay | Admitting: Internal Medicine

## 2015-12-17 ENCOUNTER — Other Ambulatory Visit: Payer: Self-pay

## 2015-12-17 MED ORDER — WARFARIN SODIUM 5 MG PO TABS: ORAL_TABLET | ORAL | Status: AC

## 2015-12-17 MED ORDER — WARFARIN SODIUM 1 MG PO TABS: ORAL_TABLET | ORAL | Status: AC

## 2015-12-17 NOTE — Telephone Encounter (Signed)
Pt called requesting refills of Warfarin 5mg  and 1mg  tablets be sent to [facility].  Pt is in process of moving to [place] and they will transfer rx's for pt to new pharmacy. Rx refills sent to pharmacy as requested.

## 2015-12-17 NOTE — Telephone Encounter (Signed)
ALREADY DONE PER COUMADIN CLINIC

## 2015-12-24 DIAGNOSIS — J4520 Mild intermittent asthma, uncomplicated: Secondary | ICD-10-CM | POA: Diagnosis not present

## 2015-12-24 DIAGNOSIS — Z0000 Encounter for general adult medical examination without abnormal findings: Secondary | ICD-10-CM | POA: Diagnosis not present

## 2015-12-24 DIAGNOSIS — Z23 Encounter for immunization: Secondary | ICD-10-CM | POA: Diagnosis not present

## 2015-12-24 DIAGNOSIS — I10 Essential (primary) hypertension: Secondary | ICD-10-CM | POA: Diagnosis not present

## 2015-12-24 DIAGNOSIS — F17201 Nicotine dependence, unspecified, in remission: Secondary | ICD-10-CM | POA: Diagnosis not present

## 2015-12-24 DIAGNOSIS — E782 Mixed hyperlipidemia: Secondary | ICD-10-CM | POA: Diagnosis not present

## 2015-12-24 DIAGNOSIS — Z130 Encounter for screening for diseases of the blood and blood-forming organs and certain disorders involving the immune mechanism: Secondary | ICD-10-CM | POA: Diagnosis not present

## 2015-12-24 DIAGNOSIS — I209 Angina pectoris, unspecified: Secondary | ICD-10-CM | POA: Diagnosis not present

## 2015-12-24 DIAGNOSIS — Z13220 Encounter for screening for lipoid disorders: Secondary | ICD-10-CM | POA: Diagnosis not present

## 2015-12-24 DIAGNOSIS — Z125 Encounter for screening for malignant neoplasm of prostate: Secondary | ICD-10-CM | POA: Diagnosis not present

## 2015-12-24 DIAGNOSIS — Z6826 Body mass index (BMI) 26.0-26.9, adult: Secondary | ICD-10-CM | POA: Diagnosis not present

## 2015-12-24 DIAGNOSIS — Z13228 Encounter for screening for other metabolic disorders: Secondary | ICD-10-CM | POA: Diagnosis not present

## 2016-01-04 DIAGNOSIS — I480 Paroxysmal atrial fibrillation: Secondary | ICD-10-CM | POA: Diagnosis not present

## 2016-01-08 DIAGNOSIS — I480 Paroxysmal atrial fibrillation: Secondary | ICD-10-CM | POA: Diagnosis not present

## 2016-01-08 DIAGNOSIS — Z7901 Long term (current) use of anticoagulants: Secondary | ICD-10-CM | POA: Diagnosis not present

## 2016-01-24 DIAGNOSIS — I480 Paroxysmal atrial fibrillation: Secondary | ICD-10-CM | POA: Diagnosis not present

## 2016-01-28 DIAGNOSIS — R000 Tachycardia, unspecified: Secondary | ICD-10-CM | POA: Diagnosis not present

## 2016-01-28 DIAGNOSIS — I480 Paroxysmal atrial fibrillation: Secondary | ICD-10-CM | POA: Diagnosis not present

## 2016-02-04 DIAGNOSIS — I480 Paroxysmal atrial fibrillation: Secondary | ICD-10-CM | POA: Diagnosis not present

## 2016-02-04 DIAGNOSIS — I4891 Unspecified atrial fibrillation: Secondary | ICD-10-CM | POA: Diagnosis not present

## 2016-02-12 DIAGNOSIS — I4891 Unspecified atrial fibrillation: Secondary | ICD-10-CM | POA: Diagnosis not present

## 2016-02-12 DIAGNOSIS — Z7901 Long term (current) use of anticoagulants: Secondary | ICD-10-CM | POA: Diagnosis not present

## 2016-02-12 DIAGNOSIS — I480 Paroxysmal atrial fibrillation: Secondary | ICD-10-CM | POA: Diagnosis not present

## 2016-02-20 DIAGNOSIS — I480 Paroxysmal atrial fibrillation: Secondary | ICD-10-CM | POA: Diagnosis not present

## 2016-02-28 DIAGNOSIS — Z7901 Long term (current) use of anticoagulants: Secondary | ICD-10-CM | POA: Diagnosis not present

## 2016-02-28 DIAGNOSIS — I4891 Unspecified atrial fibrillation: Secondary | ICD-10-CM | POA: Diagnosis not present

## 2016-02-28 DIAGNOSIS — I480 Paroxysmal atrial fibrillation: Secondary | ICD-10-CM | POA: Diagnosis not present

## 2016-03-12 DIAGNOSIS — Z7901 Long term (current) use of anticoagulants: Secondary | ICD-10-CM | POA: Diagnosis not present

## 2016-03-12 DIAGNOSIS — I4891 Unspecified atrial fibrillation: Secondary | ICD-10-CM | POA: Diagnosis not present

## 2016-03-12 DIAGNOSIS — Z23 Encounter for immunization: Secondary | ICD-10-CM | POA: Diagnosis not present

## 2016-03-12 DIAGNOSIS — I480 Paroxysmal atrial fibrillation: Secondary | ICD-10-CM | POA: Diagnosis not present

## 2016-03-19 DIAGNOSIS — Z7901 Long term (current) use of anticoagulants: Secondary | ICD-10-CM | POA: Diagnosis not present

## 2016-03-19 DIAGNOSIS — I480 Paroxysmal atrial fibrillation: Secondary | ICD-10-CM | POA: Diagnosis not present

## 2016-03-24 DIAGNOSIS — G4730 Sleep apnea, unspecified: Secondary | ICD-10-CM | POA: Diagnosis not present

## 2016-03-24 DIAGNOSIS — R0609 Other forms of dyspnea: Secondary | ICD-10-CM | POA: Diagnosis not present

## 2016-03-24 DIAGNOSIS — I2510 Atherosclerotic heart disease of native coronary artery without angina pectoris: Secondary | ICD-10-CM | POA: Diagnosis not present

## 2016-03-24 DIAGNOSIS — Z9181 History of falling: Secondary | ICD-10-CM | POA: Diagnosis not present

## 2016-03-24 DIAGNOSIS — Z951 Presence of aortocoronary bypass graft: Secondary | ICD-10-CM | POA: Diagnosis not present

## 2016-03-24 DIAGNOSIS — E785 Hyperlipidemia, unspecified: Secondary | ICD-10-CM | POA: Diagnosis not present

## 2016-03-24 DIAGNOSIS — H539 Unspecified visual disturbance: Secondary | ICD-10-CM | POA: Diagnosis not present

## 2016-03-24 DIAGNOSIS — Z9889 Other specified postprocedural states: Secondary | ICD-10-CM | POA: Diagnosis not present

## 2016-04-09 DIAGNOSIS — I6523 Occlusion and stenosis of bilateral carotid arteries: Secondary | ICD-10-CM | POA: Diagnosis not present

## 2016-04-09 DIAGNOSIS — R0609 Other forms of dyspnea: Secondary | ICD-10-CM | POA: Diagnosis not present

## 2016-04-09 DIAGNOSIS — I6529 Occlusion and stenosis of unspecified carotid artery: Secondary | ICD-10-CM | POA: Diagnosis not present

## 2016-04-09 DIAGNOSIS — I6522 Occlusion and stenosis of left carotid artery: Secondary | ICD-10-CM | POA: Diagnosis not present

## 2016-04-09 DIAGNOSIS — Z9889 Other specified postprocedural states: Secondary | ICD-10-CM | POA: Diagnosis not present

## 2016-04-09 DIAGNOSIS — I2510 Atherosclerotic heart disease of native coronary artery without angina pectoris: Secondary | ICD-10-CM | POA: Diagnosis not present

## 2016-04-11 DIAGNOSIS — I361 Nonrheumatic tricuspid (valve) insufficiency: Secondary | ICD-10-CM | POA: Diagnosis not present

## 2016-04-11 DIAGNOSIS — I2510 Atherosclerotic heart disease of native coronary artery without angina pectoris: Secondary | ICD-10-CM | POA: Diagnosis not present

## 2016-04-11 DIAGNOSIS — R0609 Other forms of dyspnea: Secondary | ICD-10-CM | POA: Diagnosis not present

## 2016-04-14 DIAGNOSIS — I480 Paroxysmal atrial fibrillation: Secondary | ICD-10-CM | POA: Diagnosis not present

## 2016-04-14 DIAGNOSIS — Z7901 Long term (current) use of anticoagulants: Secondary | ICD-10-CM | POA: Diagnosis not present

## 2016-04-15 ENCOUNTER — Other Ambulatory Visit: Payer: Self-pay | Admitting: Family Medicine

## 2016-04-16 DIAGNOSIS — R9439 Abnormal result of other cardiovascular function study: Secondary | ICD-10-CM | POA: Diagnosis not present

## 2016-04-16 DIAGNOSIS — Z7901 Long term (current) use of anticoagulants: Secondary | ICD-10-CM | POA: Diagnosis not present

## 2016-04-16 DIAGNOSIS — Z951 Presence of aortocoronary bypass graft: Secondary | ICD-10-CM | POA: Diagnosis not present

## 2016-04-16 DIAGNOSIS — Z9889 Other specified postprocedural states: Secondary | ICD-10-CM | POA: Diagnosis not present

## 2016-04-16 DIAGNOSIS — I4891 Unspecified atrial fibrillation: Secondary | ICD-10-CM | POA: Diagnosis not present

## 2016-04-16 DIAGNOSIS — I480 Paroxysmal atrial fibrillation: Secondary | ICD-10-CM | POA: Diagnosis not present

## 2016-04-16 DIAGNOSIS — I2510 Atherosclerotic heart disease of native coronary artery without angina pectoris: Secondary | ICD-10-CM | POA: Diagnosis not present

## 2016-04-16 DIAGNOSIS — G4730 Sleep apnea, unspecified: Secondary | ICD-10-CM | POA: Diagnosis not present

## 2016-04-16 DIAGNOSIS — E785 Hyperlipidemia, unspecified: Secondary | ICD-10-CM | POA: Diagnosis not present

## 2016-04-17 DIAGNOSIS — I480 Paroxysmal atrial fibrillation: Secondary | ICD-10-CM | POA: Diagnosis not present

## 2016-04-17 DIAGNOSIS — R9439 Abnormal result of other cardiovascular function study: Secondary | ICD-10-CM | POA: Diagnosis not present

## 2016-04-18 DIAGNOSIS — Z7901 Long term (current) use of anticoagulants: Secondary | ICD-10-CM | POA: Diagnosis not present

## 2016-04-18 DIAGNOSIS — R9439 Abnormal result of other cardiovascular function study: Secondary | ICD-10-CM | POA: Diagnosis not present

## 2016-04-18 DIAGNOSIS — I209 Angina pectoris, unspecified: Secondary | ICD-10-CM | POA: Diagnosis not present

## 2016-04-18 DIAGNOSIS — I480 Paroxysmal atrial fibrillation: Secondary | ICD-10-CM | POA: Diagnosis not present

## 2016-04-21 DIAGNOSIS — I480 Paroxysmal atrial fibrillation: Secondary | ICD-10-CM | POA: Diagnosis not present

## 2016-04-22 DIAGNOSIS — I25728 Atherosclerosis of autologous artery coronary artery bypass graft(s) with other forms of angina pectoris: Secondary | ICD-10-CM | POA: Diagnosis not present

## 2016-04-22 DIAGNOSIS — I25718 Atherosclerosis of autologous vein coronary artery bypass graft(s) with other forms of angina pectoris: Secondary | ICD-10-CM | POA: Diagnosis not present

## 2016-04-22 DIAGNOSIS — I2582 Chronic total occlusion of coronary artery: Secondary | ICD-10-CM | POA: Diagnosis not present

## 2016-04-22 DIAGNOSIS — I25709 Atherosclerosis of coronary artery bypass graft(s), unspecified, with unspecified angina pectoris: Secondary | ICD-10-CM | POA: Diagnosis not present

## 2016-04-22 DIAGNOSIS — I25118 Atherosclerotic heart disease of native coronary artery with other forms of angina pectoris: Secondary | ICD-10-CM | POA: Diagnosis not present

## 2016-04-22 DIAGNOSIS — R9439 Abnormal result of other cardiovascular function study: Secondary | ICD-10-CM | POA: Diagnosis not present

## 2016-05-05 DIAGNOSIS — F411 Generalized anxiety disorder: Secondary | ICD-10-CM | POA: Diagnosis not present

## 2016-05-05 DIAGNOSIS — Z7901 Long term (current) use of anticoagulants: Secondary | ICD-10-CM | POA: Diagnosis not present

## 2016-05-05 DIAGNOSIS — I480 Paroxysmal atrial fibrillation: Secondary | ICD-10-CM | POA: Diagnosis not present

## 2016-05-15 DIAGNOSIS — F331 Major depressive disorder, recurrent, moderate: Secondary | ICD-10-CM | POA: Diagnosis not present

## 2016-05-15 DIAGNOSIS — Z7901 Long term (current) use of anticoagulants: Secondary | ICD-10-CM | POA: Diagnosis not present

## 2016-05-15 DIAGNOSIS — F411 Generalized anxiety disorder: Secondary | ICD-10-CM | POA: Diagnosis not present

## 2016-05-22 DIAGNOSIS — Z9181 History of falling: Secondary | ICD-10-CM | POA: Diagnosis not present

## 2016-05-22 DIAGNOSIS — Z87891 Personal history of nicotine dependence: Secondary | ICD-10-CM | POA: Diagnosis not present

## 2016-05-22 DIAGNOSIS — Z8249 Family history of ischemic heart disease and other diseases of the circulatory system: Secondary | ICD-10-CM | POA: Diagnosis not present

## 2016-05-22 DIAGNOSIS — I10 Essential (primary) hypertension: Secondary | ICD-10-CM | POA: Diagnosis not present

## 2016-05-22 DIAGNOSIS — I25810 Atherosclerosis of coronary artery bypass graft(s) without angina pectoris: Secondary | ICD-10-CM | POA: Diagnosis not present

## 2016-05-22 DIAGNOSIS — R0602 Shortness of breath: Secondary | ICD-10-CM | POA: Diagnosis not present

## 2016-05-22 DIAGNOSIS — E785 Hyperlipidemia, unspecified: Secondary | ICD-10-CM | POA: Diagnosis not present

## 2016-05-22 DIAGNOSIS — Z7901 Long term (current) use of anticoagulants: Secondary | ICD-10-CM | POA: Diagnosis not present

## 2016-05-27 DIAGNOSIS — F411 Generalized anxiety disorder: Secondary | ICD-10-CM | POA: Diagnosis not present

## 2016-05-27 DIAGNOSIS — F331 Major depressive disorder, recurrent, moderate: Secondary | ICD-10-CM | POA: Diagnosis not present

## 2016-05-27 DIAGNOSIS — I4891 Unspecified atrial fibrillation: Secondary | ICD-10-CM | POA: Diagnosis not present

## 2016-05-27 DIAGNOSIS — Z7901 Long term (current) use of anticoagulants: Secondary | ICD-10-CM | POA: Diagnosis not present

## 2016-06-03 DIAGNOSIS — Z7901 Long term (current) use of anticoagulants: Secondary | ICD-10-CM | POA: Diagnosis not present

## 2016-06-03 DIAGNOSIS — I25118 Atherosclerotic heart disease of native coronary artery with other forms of angina pectoris: Secondary | ICD-10-CM | POA: Diagnosis not present

## 2016-06-03 DIAGNOSIS — I10 Essential (primary) hypertension: Secondary | ICD-10-CM | POA: Diagnosis not present

## 2016-06-03 DIAGNOSIS — E785 Hyperlipidemia, unspecified: Secondary | ICD-10-CM | POA: Diagnosis not present

## 2016-06-03 DIAGNOSIS — I482 Chronic atrial fibrillation: Secondary | ICD-10-CM | POA: Diagnosis not present

## 2016-06-03 DIAGNOSIS — Z9889 Other specified postprocedural states: Secondary | ICD-10-CM | POA: Diagnosis not present

## 2016-06-03 DIAGNOSIS — Z87891 Personal history of nicotine dependence: Secondary | ICD-10-CM | POA: Diagnosis not present

## 2016-06-03 DIAGNOSIS — Z8673 Personal history of transient ischemic attack (TIA), and cerebral infarction without residual deficits: Secondary | ICD-10-CM | POA: Diagnosis not present

## 2016-06-04 DIAGNOSIS — I480 Paroxysmal atrial fibrillation: Secondary | ICD-10-CM | POA: Diagnosis not present

## 2016-06-13 DIAGNOSIS — F411 Generalized anxiety disorder: Secondary | ICD-10-CM | POA: Diagnosis not present

## 2016-06-13 DIAGNOSIS — I480 Paroxysmal atrial fibrillation: Secondary | ICD-10-CM | POA: Diagnosis not present

## 2016-06-13 DIAGNOSIS — F331 Major depressive disorder, recurrent, moderate: Secondary | ICD-10-CM | POA: Diagnosis not present

## 2016-06-13 DIAGNOSIS — F5105 Insomnia due to other mental disorder: Secondary | ICD-10-CM | POA: Diagnosis not present

## 2016-06-13 DIAGNOSIS — I481 Persistent atrial fibrillation: Secondary | ICD-10-CM | POA: Diagnosis not present

## 2016-06-13 DIAGNOSIS — Z7901 Long term (current) use of anticoagulants: Secondary | ICD-10-CM | POA: Diagnosis not present

## 2016-06-14 DIAGNOSIS — M1120 Other chondrocalcinosis, unspecified site: Secondary | ICD-10-CM | POA: Diagnosis not present

## 2016-06-14 DIAGNOSIS — Z86718 Personal history of other venous thrombosis and embolism: Secondary | ICD-10-CM | POA: Diagnosis not present

## 2016-06-14 DIAGNOSIS — I2510 Atherosclerotic heart disease of native coronary artery without angina pectoris: Secondary | ICD-10-CM | POA: Diagnosis not present

## 2016-06-14 DIAGNOSIS — R262 Difficulty in walking, not elsewhere classified: Secondary | ICD-10-CM | POA: Diagnosis not present

## 2016-06-14 DIAGNOSIS — Z7901 Long term (current) use of anticoagulants: Secondary | ICD-10-CM | POA: Diagnosis not present

## 2016-06-14 DIAGNOSIS — M1711 Unilateral primary osteoarthritis, right knee: Secondary | ICD-10-CM | POA: Diagnosis not present

## 2016-06-14 DIAGNOSIS — R52 Pain, unspecified: Secondary | ICD-10-CM | POA: Diagnosis not present

## 2016-06-14 DIAGNOSIS — M25561 Pain in right knee: Secondary | ICD-10-CM | POA: Diagnosis not present

## 2016-06-23 DIAGNOSIS — I482 Chronic atrial fibrillation: Secondary | ICD-10-CM | POA: Diagnosis not present

## 2016-07-01 DIAGNOSIS — I4891 Unspecified atrial fibrillation: Secondary | ICD-10-CM | POA: Diagnosis not present

## 2016-07-01 DIAGNOSIS — I482 Chronic atrial fibrillation: Secondary | ICD-10-CM | POA: Diagnosis not present

## 2016-07-02 DIAGNOSIS — I4891 Unspecified atrial fibrillation: Secondary | ICD-10-CM | POA: Diagnosis not present

## 2016-07-02 DIAGNOSIS — Z87891 Personal history of nicotine dependence: Secondary | ICD-10-CM | POA: Diagnosis not present

## 2016-07-02 DIAGNOSIS — R0609 Other forms of dyspnea: Secondary | ICD-10-CM | POA: Diagnosis not present

## 2016-07-02 DIAGNOSIS — Z951 Presence of aortocoronary bypass graft: Secondary | ICD-10-CM | POA: Diagnosis not present

## 2016-07-02 DIAGNOSIS — I10 Essential (primary) hypertension: Secondary | ICD-10-CM | POA: Diagnosis not present

## 2016-07-02 DIAGNOSIS — I25119 Atherosclerotic heart disease of native coronary artery with unspecified angina pectoris: Secondary | ICD-10-CM | POA: Diagnosis not present

## 2016-07-02 DIAGNOSIS — I679 Cerebrovascular disease, unspecified: Secondary | ICD-10-CM | POA: Diagnosis not present

## 2016-07-02 DIAGNOSIS — E785 Hyperlipidemia, unspecified: Secondary | ICD-10-CM | POA: Diagnosis not present

## 2016-07-02 DIAGNOSIS — I2582 Chronic total occlusion of coronary artery: Secondary | ICD-10-CM | POA: Diagnosis not present

## 2016-07-07 DIAGNOSIS — F411 Generalized anxiety disorder: Secondary | ICD-10-CM | POA: Diagnosis not present

## 2016-07-07 DIAGNOSIS — F5105 Insomnia due to other mental disorder: Secondary | ICD-10-CM | POA: Diagnosis not present

## 2016-07-07 DIAGNOSIS — I25710 Atherosclerosis of autologous vein coronary artery bypass graft(s) with unstable angina pectoris: Secondary | ICD-10-CM | POA: Diagnosis not present

## 2016-07-07 DIAGNOSIS — I481 Persistent atrial fibrillation: Secondary | ICD-10-CM | POA: Diagnosis not present

## 2016-07-11 DIAGNOSIS — I481 Persistent atrial fibrillation: Secondary | ICD-10-CM | POA: Diagnosis not present

## 2016-07-11 DIAGNOSIS — F5105 Insomnia due to other mental disorder: Secondary | ICD-10-CM | POA: Diagnosis not present

## 2016-07-11 DIAGNOSIS — F331 Major depressive disorder, recurrent, moderate: Secondary | ICD-10-CM | POA: Diagnosis not present

## 2016-07-11 DIAGNOSIS — F411 Generalized anxiety disorder: Secondary | ICD-10-CM | POA: Diagnosis not present

## 2016-07-23 DIAGNOSIS — M21161 Varus deformity, not elsewhere classified, right knee: Secondary | ICD-10-CM | POA: Diagnosis not present

## 2016-07-23 DIAGNOSIS — M1711 Unilateral primary osteoarthritis, right knee: Secondary | ICD-10-CM | POA: Diagnosis not present

## 2016-07-23 DIAGNOSIS — Z471 Aftercare following joint replacement surgery: Secondary | ICD-10-CM | POA: Diagnosis not present

## 2016-07-23 DIAGNOSIS — T8484XA Pain due to internal orthopedic prosthetic devices, implants and grafts, initial encounter: Secondary | ICD-10-CM | POA: Diagnosis not present

## 2016-07-23 DIAGNOSIS — Z96652 Presence of left artificial knee joint: Secondary | ICD-10-CM | POA: Diagnosis not present

## 2016-07-29 DIAGNOSIS — I482 Chronic atrial fibrillation: Secondary | ICD-10-CM | POA: Diagnosis not present

## 2016-07-29 DIAGNOSIS — Z7901 Long term (current) use of anticoagulants: Secondary | ICD-10-CM | POA: Diagnosis not present

## 2016-07-29 DIAGNOSIS — F411 Generalized anxiety disorder: Secondary | ICD-10-CM | POA: Diagnosis not present

## 2016-07-29 DIAGNOSIS — I480 Paroxysmal atrial fibrillation: Secondary | ICD-10-CM | POA: Diagnosis not present

## 2016-07-29 DIAGNOSIS — Z951 Presence of aortocoronary bypass graft: Secondary | ICD-10-CM | POA: Diagnosis not present

## 2016-07-29 DIAGNOSIS — I481 Persistent atrial fibrillation: Secondary | ICD-10-CM | POA: Diagnosis not present

## 2016-07-29 DIAGNOSIS — I2582 Chronic total occlusion of coronary artery: Secondary | ICD-10-CM | POA: Diagnosis not present

## 2016-07-29 DIAGNOSIS — I2510 Atherosclerotic heart disease of native coronary artery without angina pectoris: Secondary | ICD-10-CM | POA: Diagnosis not present

## 2016-07-29 DIAGNOSIS — Z87891 Personal history of nicotine dependence: Secondary | ICD-10-CM | POA: Diagnosis not present

## 2016-07-29 DIAGNOSIS — I10 Essential (primary) hypertension: Secondary | ICD-10-CM | POA: Diagnosis not present

## 2016-07-29 DIAGNOSIS — Z9889 Other specified postprocedural states: Secondary | ICD-10-CM | POA: Diagnosis not present

## 2016-07-29 DIAGNOSIS — F5105 Insomnia due to other mental disorder: Secondary | ICD-10-CM | POA: Diagnosis not present

## 2016-07-29 DIAGNOSIS — E785 Hyperlipidemia, unspecified: Secondary | ICD-10-CM | POA: Diagnosis not present

## 2016-08-06 DIAGNOSIS — I480 Paroxysmal atrial fibrillation: Secondary | ICD-10-CM | POA: Diagnosis not present

## 2016-08-06 DIAGNOSIS — N179 Acute kidney failure, unspecified: Secondary | ICD-10-CM | POA: Diagnosis not present

## 2016-08-11 DIAGNOSIS — I481 Persistent atrial fibrillation: Secondary | ICD-10-CM | POA: Diagnosis not present

## 2016-08-11 DIAGNOSIS — I5030 Unspecified diastolic (congestive) heart failure: Secondary | ICD-10-CM | POA: Diagnosis not present

## 2016-08-13 DIAGNOSIS — I2582 Chronic total occlusion of coronary artery: Secondary | ICD-10-CM | POA: Diagnosis not present

## 2016-08-13 DIAGNOSIS — Z87891 Personal history of nicotine dependence: Secondary | ICD-10-CM | POA: Diagnosis not present

## 2016-08-13 DIAGNOSIS — E785 Hyperlipidemia, unspecified: Secondary | ICD-10-CM | POA: Diagnosis not present

## 2016-08-13 DIAGNOSIS — I252 Old myocardial infarction: Secondary | ICD-10-CM | POA: Diagnosis not present

## 2016-08-13 DIAGNOSIS — I679 Cerebrovascular disease, unspecified: Secondary | ICD-10-CM | POA: Diagnosis not present

## 2016-08-13 DIAGNOSIS — Z951 Presence of aortocoronary bypass graft: Secondary | ICD-10-CM | POA: Diagnosis not present

## 2016-08-13 DIAGNOSIS — I25119 Atherosclerotic heart disease of native coronary artery with unspecified angina pectoris: Secondary | ICD-10-CM | POA: Diagnosis not present

## 2016-08-13 DIAGNOSIS — I4891 Unspecified atrial fibrillation: Secondary | ICD-10-CM | POA: Diagnosis not present

## 2016-08-13 DIAGNOSIS — I313 Pericardial effusion (noninflammatory): Secondary | ICD-10-CM | POA: Diagnosis not present

## 2016-08-13 DIAGNOSIS — I10 Essential (primary) hypertension: Secondary | ICD-10-CM | POA: Diagnosis not present

## 2016-08-15 DIAGNOSIS — R0600 Dyspnea, unspecified: Secondary | ICD-10-CM | POA: Diagnosis not present

## 2016-08-15 DIAGNOSIS — Z79899 Other long term (current) drug therapy: Secondary | ICD-10-CM | POA: Diagnosis not present

## 2016-08-25 DIAGNOSIS — Z8249 Family history of ischemic heart disease and other diseases of the circulatory system: Secondary | ICD-10-CM | POA: Diagnosis not present

## 2016-08-25 DIAGNOSIS — Z955 Presence of coronary angioplasty implant and graft: Secondary | ICD-10-CM | POA: Diagnosis not present

## 2016-08-25 DIAGNOSIS — Z87891 Personal history of nicotine dependence: Secondary | ICD-10-CM | POA: Diagnosis not present

## 2016-08-25 DIAGNOSIS — I2510 Atherosclerotic heart disease of native coronary artery without angina pectoris: Secondary | ICD-10-CM | POA: Diagnosis not present

## 2016-08-25 DIAGNOSIS — I10 Essential (primary) hypertension: Secondary | ICD-10-CM | POA: Diagnosis not present

## 2016-08-25 DIAGNOSIS — R0609 Other forms of dyspnea: Secondary | ICD-10-CM | POA: Diagnosis not present

## 2016-08-25 DIAGNOSIS — Z951 Presence of aortocoronary bypass graft: Secondary | ICD-10-CM | POA: Diagnosis not present

## 2016-08-25 DIAGNOSIS — E785 Hyperlipidemia, unspecified: Secondary | ICD-10-CM | POA: Diagnosis not present

## 2016-08-26 DIAGNOSIS — M25719 Osteophyte, unspecified shoulder: Secondary | ICD-10-CM | POA: Diagnosis not present

## 2016-08-26 DIAGNOSIS — I480 Paroxysmal atrial fibrillation: Secondary | ICD-10-CM | POA: Diagnosis not present

## 2016-08-26 DIAGNOSIS — Z7901 Long term (current) use of anticoagulants: Secondary | ICD-10-CM | POA: Diagnosis not present

## 2016-08-26 DIAGNOSIS — J449 Chronic obstructive pulmonary disease, unspecified: Secondary | ICD-10-CM | POA: Diagnosis not present

## 2016-08-26 DIAGNOSIS — I481 Persistent atrial fibrillation: Secondary | ICD-10-CM | POA: Diagnosis not present

## 2016-08-26 DIAGNOSIS — I10 Essential (primary) hypertension: Secondary | ICD-10-CM | POA: Diagnosis not present

## 2016-09-01 DIAGNOSIS — Z9861 Coronary angioplasty status: Secondary | ICD-10-CM | POA: Diagnosis not present

## 2016-09-01 DIAGNOSIS — I2510 Atherosclerotic heart disease of native coronary artery without angina pectoris: Secondary | ICD-10-CM | POA: Diagnosis not present

## 2016-09-02 DIAGNOSIS — I480 Paroxysmal atrial fibrillation: Secondary | ICD-10-CM | POA: Diagnosis not present

## 2016-09-02 DIAGNOSIS — Z7901 Long term (current) use of anticoagulants: Secondary | ICD-10-CM | POA: Diagnosis not present

## 2016-09-03 DIAGNOSIS — Z9861 Coronary angioplasty status: Secondary | ICD-10-CM | POA: Diagnosis not present

## 2016-09-03 DIAGNOSIS — I2510 Atherosclerotic heart disease of native coronary artery without angina pectoris: Secondary | ICD-10-CM | POA: Diagnosis not present

## 2016-09-08 DIAGNOSIS — I482 Chronic atrial fibrillation: Secondary | ICD-10-CM | POA: Diagnosis not present

## 2016-09-08 DIAGNOSIS — I4891 Unspecified atrial fibrillation: Secondary | ICD-10-CM | POA: Diagnosis not present

## 2016-09-09 DIAGNOSIS — M1711 Unilateral primary osteoarthritis, right knee: Secondary | ICD-10-CM | POA: Diagnosis not present

## 2016-09-10 DIAGNOSIS — I2510 Atherosclerotic heart disease of native coronary artery without angina pectoris: Secondary | ICD-10-CM | POA: Diagnosis not present

## 2016-09-10 DIAGNOSIS — Z9861 Coronary angioplasty status: Secondary | ICD-10-CM | POA: Diagnosis not present

## 2016-09-11 DIAGNOSIS — R319 Hematuria, unspecified: Secondary | ICD-10-CM | POA: Diagnosis not present

## 2016-09-11 DIAGNOSIS — F411 Generalized anxiety disorder: Secondary | ICD-10-CM | POA: Diagnosis not present

## 2016-09-11 DIAGNOSIS — I480 Paroxysmal atrial fibrillation: Secondary | ICD-10-CM | POA: Diagnosis not present

## 2016-09-12 DIAGNOSIS — Z9861 Coronary angioplasty status: Secondary | ICD-10-CM | POA: Diagnosis not present

## 2016-09-12 DIAGNOSIS — I2510 Atherosclerotic heart disease of native coronary artery without angina pectoris: Secondary | ICD-10-CM | POA: Diagnosis not present

## 2016-09-15 DIAGNOSIS — Z9861 Coronary angioplasty status: Secondary | ICD-10-CM | POA: Diagnosis not present

## 2016-09-15 DIAGNOSIS — I2510 Atherosclerotic heart disease of native coronary artery without angina pectoris: Secondary | ICD-10-CM | POA: Diagnosis not present

## 2016-09-22 DIAGNOSIS — I2510 Atherosclerotic heart disease of native coronary artery without angina pectoris: Secondary | ICD-10-CM | POA: Diagnosis not present

## 2016-09-22 DIAGNOSIS — Z9861 Coronary angioplasty status: Secondary | ICD-10-CM | POA: Diagnosis not present

## 2016-09-22 DIAGNOSIS — S0101XA Laceration without foreign body of scalp, initial encounter: Secondary | ICD-10-CM | POA: Diagnosis not present

## 2016-09-22 DIAGNOSIS — S098XXA Other specified injuries of head, initial encounter: Secondary | ICD-10-CM | POA: Diagnosis not present

## 2016-09-25 DIAGNOSIS — I480 Paroxysmal atrial fibrillation: Secondary | ICD-10-CM | POA: Diagnosis not present

## 2016-09-25 DIAGNOSIS — S0101XA Laceration without foreign body of scalp, initial encounter: Secondary | ICD-10-CM | POA: Diagnosis not present

## 2016-09-25 DIAGNOSIS — Z7901 Long term (current) use of anticoagulants: Secondary | ICD-10-CM | POA: Diagnosis not present

## 2016-09-26 DIAGNOSIS — I10 Essential (primary) hypertension: Secondary | ICD-10-CM | POA: Diagnosis not present

## 2016-09-26 DIAGNOSIS — R5383 Other fatigue: Secondary | ICD-10-CM | POA: Diagnosis not present

## 2016-09-26 DIAGNOSIS — E785 Hyperlipidemia, unspecified: Secondary | ICD-10-CM | POA: Diagnosis not present

## 2016-09-26 DIAGNOSIS — Z955 Presence of coronary angioplasty implant and graft: Secondary | ICD-10-CM | POA: Diagnosis not present

## 2016-09-26 DIAGNOSIS — R0602 Shortness of breath: Secondary | ICD-10-CM | POA: Diagnosis not present

## 2016-09-26 DIAGNOSIS — I2510 Atherosclerotic heart disease of native coronary artery without angina pectoris: Secondary | ICD-10-CM | POA: Diagnosis not present

## 2016-09-26 DIAGNOSIS — E669 Obesity, unspecified: Secondary | ICD-10-CM | POA: Diagnosis not present

## 2016-09-26 DIAGNOSIS — Z6832 Body mass index (BMI) 32.0-32.9, adult: Secondary | ICD-10-CM | POA: Diagnosis not present

## 2016-09-29 DIAGNOSIS — Z9861 Coronary angioplasty status: Secondary | ICD-10-CM | POA: Diagnosis not present

## 2016-09-29 DIAGNOSIS — I2510 Atherosclerotic heart disease of native coronary artery without angina pectoris: Secondary | ICD-10-CM | POA: Diagnosis not present

## 2016-09-30 ENCOUNTER — Ambulatory Visit: Payer: Self-pay | Admitting: Pharmacist

## 2016-10-01 DIAGNOSIS — I2510 Atherosclerotic heart disease of native coronary artery without angina pectoris: Secondary | ICD-10-CM | POA: Diagnosis not present

## 2016-10-01 DIAGNOSIS — I480 Paroxysmal atrial fibrillation: Secondary | ICD-10-CM | POA: Diagnosis not present

## 2016-10-01 DIAGNOSIS — Z7901 Long term (current) use of anticoagulants: Secondary | ICD-10-CM | POA: Diagnosis not present

## 2016-10-01 DIAGNOSIS — Z9861 Coronary angioplasty status: Secondary | ICD-10-CM | POA: Diagnosis not present

## 2016-10-03 DIAGNOSIS — Z9861 Coronary angioplasty status: Secondary | ICD-10-CM | POA: Diagnosis not present

## 2016-10-03 DIAGNOSIS — I2510 Atherosclerotic heart disease of native coronary artery without angina pectoris: Secondary | ICD-10-CM | POA: Diagnosis not present

## 2016-10-06 DIAGNOSIS — Z9861 Coronary angioplasty status: Secondary | ICD-10-CM | POA: Diagnosis not present

## 2016-10-06 DIAGNOSIS — I2510 Atherosclerotic heart disease of native coronary artery without angina pectoris: Secondary | ICD-10-CM | POA: Diagnosis not present

## 2016-10-07 DIAGNOSIS — Z7901 Long term (current) use of anticoagulants: Secondary | ICD-10-CM | POA: Diagnosis not present

## 2016-10-07 DIAGNOSIS — I480 Paroxysmal atrial fibrillation: Secondary | ICD-10-CM | POA: Diagnosis not present

## 2016-10-08 DIAGNOSIS — I2510 Atherosclerotic heart disease of native coronary artery without angina pectoris: Secondary | ICD-10-CM | POA: Diagnosis not present

## 2016-10-08 DIAGNOSIS — Z9861 Coronary angioplasty status: Secondary | ICD-10-CM | POA: Diagnosis not present

## 2016-10-16 DIAGNOSIS — I480 Paroxysmal atrial fibrillation: Secondary | ICD-10-CM | POA: Diagnosis not present

## 2016-10-16 DIAGNOSIS — K4090 Unilateral inguinal hernia, without obstruction or gangrene, not specified as recurrent: Secondary | ICD-10-CM | POA: Diagnosis not present

## 2016-10-16 DIAGNOSIS — Z7901 Long term (current) use of anticoagulants: Secondary | ICD-10-CM | POA: Diagnosis not present

## 2016-10-20 DIAGNOSIS — Z9861 Coronary angioplasty status: Secondary | ICD-10-CM | POA: Diagnosis not present

## 2016-10-20 DIAGNOSIS — I2510 Atherosclerotic heart disease of native coronary artery without angina pectoris: Secondary | ICD-10-CM | POA: Diagnosis not present

## 2016-10-23 DIAGNOSIS — K4090 Unilateral inguinal hernia, without obstruction or gangrene, not specified as recurrent: Secondary | ICD-10-CM | POA: Diagnosis not present

## 2016-10-27 DIAGNOSIS — F411 Generalized anxiety disorder: Secondary | ICD-10-CM | POA: Diagnosis not present

## 2016-10-27 DIAGNOSIS — F5104 Psychophysiologic insomnia: Secondary | ICD-10-CM | POA: Diagnosis not present

## 2016-10-27 DIAGNOSIS — R001 Bradycardia, unspecified: Secondary | ICD-10-CM | POA: Diagnosis not present

## 2016-11-07 DIAGNOSIS — E785 Hyperlipidemia, unspecified: Secondary | ICD-10-CM | POA: Diagnosis not present

## 2016-11-07 DIAGNOSIS — Z87891 Personal history of nicotine dependence: Secondary | ICD-10-CM | POA: Diagnosis not present

## 2016-11-07 DIAGNOSIS — I2582 Chronic total occlusion of coronary artery: Secondary | ICD-10-CM | POA: Diagnosis not present

## 2016-11-07 DIAGNOSIS — Z8249 Family history of ischemic heart disease and other diseases of the circulatory system: Secondary | ICD-10-CM | POA: Diagnosis not present

## 2016-11-07 DIAGNOSIS — Z951 Presence of aortocoronary bypass graft: Secondary | ICD-10-CM | POA: Diagnosis not present

## 2016-11-07 DIAGNOSIS — I10 Essential (primary) hypertension: Secondary | ICD-10-CM | POA: Diagnosis not present

## 2016-11-07 DIAGNOSIS — I2510 Atherosclerotic heart disease of native coronary artery without angina pectoris: Secondary | ICD-10-CM | POA: Diagnosis not present

## 2016-11-07 DIAGNOSIS — I482 Chronic atrial fibrillation: Secondary | ICD-10-CM | POA: Diagnosis not present

## 2016-11-07 DIAGNOSIS — Z7901 Long term (current) use of anticoagulants: Secondary | ICD-10-CM | POA: Diagnosis not present

## 2016-11-24 DIAGNOSIS — J069 Acute upper respiratory infection, unspecified: Secondary | ICD-10-CM | POA: Diagnosis not present

## 2016-11-24 DIAGNOSIS — I480 Paroxysmal atrial fibrillation: Secondary | ICD-10-CM | POA: Diagnosis not present

## 2016-11-24 DIAGNOSIS — Z7901 Long term (current) use of anticoagulants: Secondary | ICD-10-CM | POA: Diagnosis not present

## 2016-11-24 DIAGNOSIS — K5900 Constipation, unspecified: Secondary | ICD-10-CM | POA: Diagnosis not present

## 2016-11-24 DIAGNOSIS — I5033 Acute on chronic diastolic (congestive) heart failure: Secondary | ICD-10-CM | POA: Diagnosis not present

## 2016-11-25 DIAGNOSIS — M1711 Unilateral primary osteoarthritis, right knee: Secondary | ICD-10-CM | POA: Diagnosis not present

## 2016-12-01 DIAGNOSIS — I482 Chronic atrial fibrillation: Secondary | ICD-10-CM | POA: Diagnosis not present

## 2016-12-01 DIAGNOSIS — I25728 Atherosclerosis of autologous artery coronary artery bypass graft(s) with other forms of angina pectoris: Secondary | ICD-10-CM | POA: Diagnosis not present

## 2016-12-01 DIAGNOSIS — I10 Essential (primary) hypertension: Secondary | ICD-10-CM | POA: Diagnosis not present

## 2016-12-04 DIAGNOSIS — I480 Paroxysmal atrial fibrillation: Secondary | ICD-10-CM | POA: Diagnosis not present

## 2016-12-04 DIAGNOSIS — Z7901 Long term (current) use of anticoagulants: Secondary | ICD-10-CM | POA: Diagnosis not present

## 2016-12-08 DIAGNOSIS — I5033 Acute on chronic diastolic (congestive) heart failure: Secondary | ICD-10-CM | POA: Diagnosis not present

## 2016-12-08 DIAGNOSIS — I2510 Atherosclerotic heart disease of native coronary artery without angina pectoris: Secondary | ICD-10-CM | POA: Diagnosis not present

## 2016-12-18 DIAGNOSIS — Z8249 Family history of ischemic heart disease and other diseases of the circulatory system: Secondary | ICD-10-CM | POA: Diagnosis not present

## 2016-12-18 DIAGNOSIS — Z9889 Other specified postprocedural states: Secondary | ICD-10-CM | POA: Diagnosis not present

## 2016-12-18 DIAGNOSIS — I2510 Atherosclerotic heart disease of native coronary artery without angina pectoris: Secondary | ICD-10-CM | POA: Diagnosis not present

## 2016-12-18 DIAGNOSIS — Z951 Presence of aortocoronary bypass graft: Secondary | ICD-10-CM | POA: Diagnosis not present

## 2016-12-18 DIAGNOSIS — R0602 Shortness of breath: Secondary | ICD-10-CM | POA: Diagnosis not present

## 2016-12-18 DIAGNOSIS — Z87891 Personal history of nicotine dependence: Secondary | ICD-10-CM | POA: Diagnosis not present

## 2016-12-18 DIAGNOSIS — I482 Chronic atrial fibrillation: Secondary | ICD-10-CM | POA: Diagnosis not present

## 2016-12-18 DIAGNOSIS — Z7901 Long term (current) use of anticoagulants: Secondary | ICD-10-CM | POA: Diagnosis not present

## 2016-12-23 DIAGNOSIS — F411 Generalized anxiety disorder: Secondary | ICD-10-CM | POA: Diagnosis not present

## 2016-12-25 DIAGNOSIS — I25710 Atherosclerosis of autologous vein coronary artery bypass graft(s) with unstable angina pectoris: Secondary | ICD-10-CM | POA: Diagnosis not present

## 2016-12-25 DIAGNOSIS — I10 Essential (primary) hypertension: Secondary | ICD-10-CM | POA: Diagnosis not present

## 2016-12-25 DIAGNOSIS — J449 Chronic obstructive pulmonary disease, unspecified: Secondary | ICD-10-CM | POA: Diagnosis not present

## 2016-12-25 DIAGNOSIS — Z6833 Body mass index (BMI) 33.0-33.9, adult: Secondary | ICD-10-CM | POA: Diagnosis not present

## 2016-12-25 DIAGNOSIS — E782 Mixed hyperlipidemia: Secondary | ICD-10-CM | POA: Diagnosis not present

## 2016-12-25 DIAGNOSIS — Z0000 Encounter for general adult medical examination without abnormal findings: Secondary | ICD-10-CM | POA: Diagnosis not present

## 2016-12-26 DIAGNOSIS — I10 Essential (primary) hypertension: Secondary | ICD-10-CM | POA: Diagnosis not present

## 2016-12-26 DIAGNOSIS — R718 Other abnormality of red blood cells: Secondary | ICD-10-CM | POA: Diagnosis not present

## 2016-12-26 DIAGNOSIS — Z0000 Encounter for general adult medical examination without abnormal findings: Secondary | ICD-10-CM | POA: Diagnosis not present

## 2016-12-26 DIAGNOSIS — D582 Other hemoglobinopathies: Secondary | ICD-10-CM | POA: Diagnosis not present

## 2016-12-26 DIAGNOSIS — E782 Mixed hyperlipidemia: Secondary | ICD-10-CM | POA: Diagnosis not present

## 2016-12-26 DIAGNOSIS — Z125 Encounter for screening for malignant neoplasm of prostate: Secondary | ICD-10-CM | POA: Diagnosis not present

## 2017-01-05 DIAGNOSIS — I480 Paroxysmal atrial fibrillation: Secondary | ICD-10-CM | POA: Diagnosis not present

## 2017-01-08 DIAGNOSIS — I480 Paroxysmal atrial fibrillation: Secondary | ICD-10-CM | POA: Diagnosis not present

## 2017-01-08 DIAGNOSIS — Z7901 Long term (current) use of anticoagulants: Secondary | ICD-10-CM | POA: Diagnosis not present

## 2017-01-11 DIAGNOSIS — D638 Anemia in other chronic diseases classified elsewhere: Secondary | ICD-10-CM | POA: Diagnosis not present

## 2017-01-11 DIAGNOSIS — Z9861 Coronary angioplasty status: Secondary | ICD-10-CM | POA: Diagnosis not present

## 2017-01-11 DIAGNOSIS — R0601 Orthopnea: Secondary | ICD-10-CM | POA: Diagnosis not present

## 2017-01-11 DIAGNOSIS — Z79899 Other long term (current) drug therapy: Secondary | ICD-10-CM | POA: Diagnosis not present

## 2017-01-11 DIAGNOSIS — R0600 Dyspnea, unspecified: Secondary | ICD-10-CM | POA: Diagnosis not present

## 2017-01-11 DIAGNOSIS — G4733 Obstructive sleep apnea (adult) (pediatric): Secondary | ICD-10-CM | POA: Diagnosis not present

## 2017-01-11 DIAGNOSIS — F5105 Insomnia due to other mental disorder: Secondary | ICD-10-CM | POA: Diagnosis not present

## 2017-01-11 DIAGNOSIS — J449 Chronic obstructive pulmonary disease, unspecified: Secondary | ICD-10-CM | POA: Diagnosis not present

## 2017-01-11 DIAGNOSIS — Z7901 Long term (current) use of anticoagulants: Secondary | ICD-10-CM | POA: Diagnosis not present

## 2017-01-11 DIAGNOSIS — I4891 Unspecified atrial fibrillation: Secondary | ICD-10-CM | POA: Diagnosis not present

## 2017-01-11 DIAGNOSIS — R0602 Shortness of breath: Secondary | ICD-10-CM | POA: Diagnosis not present

## 2017-01-11 DIAGNOSIS — I2510 Atherosclerotic heart disease of native coronary artery without angina pectoris: Secondary | ICD-10-CM | POA: Diagnosis not present

## 2017-01-11 DIAGNOSIS — F411 Generalized anxiety disorder: Secondary | ICD-10-CM | POA: Diagnosis not present

## 2017-01-11 DIAGNOSIS — R9431 Abnormal electrocardiogram [ECG] [EKG]: Secondary | ICD-10-CM | POA: Diagnosis not present

## 2017-01-11 DIAGNOSIS — Z87891 Personal history of nicotine dependence: Secondary | ICD-10-CM | POA: Diagnosis not present

## 2017-01-12 DIAGNOSIS — I2510 Atherosclerotic heart disease of native coronary artery without angina pectoris: Secondary | ICD-10-CM | POA: Diagnosis not present

## 2017-01-12 DIAGNOSIS — D638 Anemia in other chronic diseases classified elsewhere: Secondary | ICD-10-CM | POA: Diagnosis not present

## 2017-01-12 DIAGNOSIS — R0602 Shortness of breath: Secondary | ICD-10-CM | POA: Diagnosis not present

## 2017-01-12 DIAGNOSIS — R0601 Orthopnea: Secondary | ICD-10-CM | POA: Diagnosis not present

## 2017-01-12 DIAGNOSIS — Z951 Presence of aortocoronary bypass graft: Secondary | ICD-10-CM | POA: Diagnosis not present

## 2017-01-12 DIAGNOSIS — F411 Generalized anxiety disorder: Secondary | ICD-10-CM | POA: Diagnosis not present

## 2017-01-12 DIAGNOSIS — G4733 Obstructive sleep apnea (adult) (pediatric): Secondary | ICD-10-CM | POA: Diagnosis not present

## 2017-01-13 DIAGNOSIS — F411 Generalized anxiety disorder: Secondary | ICD-10-CM | POA: Diagnosis not present

## 2017-01-13 DIAGNOSIS — D638 Anemia in other chronic diseases classified elsewhere: Secondary | ICD-10-CM | POA: Diagnosis not present

## 2017-01-13 DIAGNOSIS — G4733 Obstructive sleep apnea (adult) (pediatric): Secondary | ICD-10-CM | POA: Diagnosis not present

## 2017-01-13 DIAGNOSIS — R0601 Orthopnea: Secondary | ICD-10-CM | POA: Diagnosis not present

## 2017-01-19 DIAGNOSIS — H348122 Central retinal vein occlusion, left eye, stable: Secondary | ICD-10-CM | POA: Diagnosis not present

## 2017-01-19 DIAGNOSIS — G4733 Obstructive sleep apnea (adult) (pediatric): Secondary | ICD-10-CM | POA: Diagnosis not present

## 2017-01-19 DIAGNOSIS — F411 Generalized anxiety disorder: Secondary | ICD-10-CM | POA: Diagnosis not present

## 2017-01-19 DIAGNOSIS — R0601 Orthopnea: Secondary | ICD-10-CM | POA: Diagnosis not present

## 2017-01-19 DIAGNOSIS — Z961 Presence of intraocular lens: Secondary | ICD-10-CM | POA: Diagnosis not present

## 2017-01-19 DIAGNOSIS — H43813 Vitreous degeneration, bilateral: Secondary | ICD-10-CM | POA: Diagnosis not present

## 2017-01-19 DIAGNOSIS — H35361 Drusen (degenerative) of macula, right eye: Secondary | ICD-10-CM | POA: Diagnosis not present

## 2017-01-19 DIAGNOSIS — D638 Anemia in other chronic diseases classified elsewhere: Secondary | ICD-10-CM | POA: Diagnosis not present

## 2017-01-20 DIAGNOSIS — Z7901 Long term (current) use of anticoagulants: Secondary | ICD-10-CM | POA: Diagnosis not present

## 2017-01-20 DIAGNOSIS — I480 Paroxysmal atrial fibrillation: Secondary | ICD-10-CM | POA: Diagnosis not present

## 2017-01-23 DIAGNOSIS — Z7189 Other specified counseling: Secondary | ICD-10-CM | POA: Diagnosis not present

## 2017-01-23 DIAGNOSIS — Z515 Encounter for palliative care: Secondary | ICD-10-CM | POA: Diagnosis not present

## 2017-01-23 DIAGNOSIS — F419 Anxiety disorder, unspecified: Secondary | ICD-10-CM | POA: Diagnosis not present

## 2017-01-23 DIAGNOSIS — I2510 Atherosclerotic heart disease of native coronary artery without angina pectoris: Secondary | ICD-10-CM | POA: Diagnosis not present

## 2017-01-23 DIAGNOSIS — G4700 Insomnia, unspecified: Secondary | ICD-10-CM | POA: Diagnosis not present

## 2017-01-29 DIAGNOSIS — L820 Inflamed seborrheic keratosis: Secondary | ICD-10-CM | POA: Diagnosis not present

## 2017-01-29 DIAGNOSIS — L821 Other seborrheic keratosis: Secondary | ICD-10-CM | POA: Diagnosis not present

## 2017-02-06 DIAGNOSIS — Z66 Do not resuscitate: Secondary | ICD-10-CM | POA: Diagnosis not present

## 2017-02-06 DIAGNOSIS — F419 Anxiety disorder, unspecified: Secondary | ICD-10-CM | POA: Diagnosis not present

## 2017-02-06 DIAGNOSIS — Z515 Encounter for palliative care: Secondary | ICD-10-CM | POA: Diagnosis not present

## 2017-02-06 DIAGNOSIS — G4700 Insomnia, unspecified: Secondary | ICD-10-CM | POA: Diagnosis not present

## 2017-02-06 DIAGNOSIS — Z7189 Other specified counseling: Secondary | ICD-10-CM | POA: Diagnosis not present

## 2017-02-06 DIAGNOSIS — I2510 Atherosclerotic heart disease of native coronary artery without angina pectoris: Secondary | ICD-10-CM | POA: Diagnosis not present

## 2017-02-16 DIAGNOSIS — I480 Paroxysmal atrial fibrillation: Secondary | ICD-10-CM | POA: Diagnosis not present

## 2017-02-16 DIAGNOSIS — Z7901 Long term (current) use of anticoagulants: Secondary | ICD-10-CM | POA: Diagnosis not present

## 2017-02-26 ENCOUNTER — Encounter: Payer: Self-pay | Admitting: Family Medicine

## 2017-03-03 DIAGNOSIS — F419 Anxiety disorder, unspecified: Secondary | ICD-10-CM | POA: Diagnosis not present

## 2017-03-03 DIAGNOSIS — M1990 Unspecified osteoarthritis, unspecified site: Secondary | ICD-10-CM | POA: Diagnosis not present

## 2017-03-03 DIAGNOSIS — G4700 Insomnia, unspecified: Secondary | ICD-10-CM | POA: Diagnosis not present

## 2017-03-03 DIAGNOSIS — I2510 Atherosclerotic heart disease of native coronary artery without angina pectoris: Secondary | ICD-10-CM | POA: Diagnosis not present

## 2017-03-03 DIAGNOSIS — Z515 Encounter for palliative care: Secondary | ICD-10-CM | POA: Diagnosis not present

## 2017-03-09 DIAGNOSIS — Z23 Encounter for immunization: Secondary | ICD-10-CM | POA: Diagnosis not present

## 2017-03-17 DIAGNOSIS — M170 Bilateral primary osteoarthritis of knee: Secondary | ICD-10-CM | POA: Diagnosis not present

## 2017-03-17 DIAGNOSIS — I482 Chronic atrial fibrillation: Secondary | ICD-10-CM | POA: Diagnosis not present

## 2017-03-17 DIAGNOSIS — E7849 Other hyperlipidemia: Secondary | ICD-10-CM | POA: Diagnosis not present

## 2017-03-17 DIAGNOSIS — I6340 Cerebral infarction due to embolism of unspecified cerebral artery: Secondary | ICD-10-CM | POA: Diagnosis not present

## 2017-03-17 DIAGNOSIS — C61 Malignant neoplasm of prostate: Secondary | ICD-10-CM | POA: Diagnosis not present

## 2017-03-17 DIAGNOSIS — I10 Essential (primary) hypertension: Secondary | ICD-10-CM | POA: Diagnosis not present

## 2017-03-17 DIAGNOSIS — Z6833 Body mass index (BMI) 33.0-33.9, adult: Secondary | ICD-10-CM | POA: Diagnosis not present

## 2017-03-17 DIAGNOSIS — I2510 Atherosclerotic heart disease of native coronary artery without angina pectoris: Secondary | ICD-10-CM | POA: Diagnosis not present

## 2017-03-17 DIAGNOSIS — J418 Mixed simple and mucopurulent chronic bronchitis: Secondary | ICD-10-CM | POA: Diagnosis not present

## 2017-03-18 DIAGNOSIS — Z7902 Long term (current) use of antithrombotics/antiplatelets: Secondary | ICD-10-CM | POA: Diagnosis not present

## 2017-03-18 DIAGNOSIS — Z7901 Long term (current) use of anticoagulants: Secondary | ICD-10-CM | POA: Diagnosis not present

## 2017-03-18 DIAGNOSIS — R0609 Other forms of dyspnea: Secondary | ICD-10-CM | POA: Diagnosis not present

## 2017-03-18 DIAGNOSIS — I2510 Atherosclerotic heart disease of native coronary artery without angina pectoris: Secondary | ICD-10-CM | POA: Diagnosis not present

## 2017-03-18 DIAGNOSIS — Z955 Presence of coronary angioplasty implant and graft: Secondary | ICD-10-CM | POA: Diagnosis not present

## 2017-03-18 DIAGNOSIS — Z951 Presence of aortocoronary bypass graft: Secondary | ICD-10-CM | POA: Diagnosis not present

## 2017-03-18 DIAGNOSIS — I25810 Atherosclerosis of coronary artery bypass graft(s) without angina pectoris: Secondary | ICD-10-CM | POA: Diagnosis not present

## 2017-03-18 DIAGNOSIS — Z87891 Personal history of nicotine dependence: Secondary | ICD-10-CM | POA: Diagnosis not present

## 2017-04-13 DIAGNOSIS — H1131 Conjunctival hemorrhage, right eye: Secondary | ICD-10-CM | POA: Diagnosis not present

## 2017-04-21 DIAGNOSIS — I2510 Atherosclerotic heart disease of native coronary artery without angina pectoris: Secondary | ICD-10-CM | POA: Diagnosis not present

## 2017-04-21 DIAGNOSIS — Z515 Encounter for palliative care: Secondary | ICD-10-CM | POA: Diagnosis not present

## 2017-04-21 DIAGNOSIS — M1990 Unspecified osteoarthritis, unspecified site: Secondary | ICD-10-CM | POA: Diagnosis not present

## 2017-04-21 DIAGNOSIS — F419 Anxiety disorder, unspecified: Secondary | ICD-10-CM | POA: Diagnosis not present

## 2017-04-21 DIAGNOSIS — M25561 Pain in right knee: Secondary | ICD-10-CM | POA: Diagnosis not present

## 2017-04-21 DIAGNOSIS — G4700 Insomnia, unspecified: Secondary | ICD-10-CM | POA: Diagnosis not present

## 2017-05-07 DIAGNOSIS — Z9889 Other specified postprocedural states: Secondary | ICD-10-CM | POA: Diagnosis not present

## 2017-05-07 DIAGNOSIS — E785 Hyperlipidemia, unspecified: Secondary | ICD-10-CM | POA: Diagnosis not present

## 2017-05-07 DIAGNOSIS — Z87891 Personal history of nicotine dependence: Secondary | ICD-10-CM | POA: Diagnosis not present

## 2017-05-07 DIAGNOSIS — Z7901 Long term (current) use of anticoagulants: Secondary | ICD-10-CM | POA: Diagnosis not present

## 2017-05-07 DIAGNOSIS — I25118 Atherosclerotic heart disease of native coronary artery with other forms of angina pectoris: Secondary | ICD-10-CM | POA: Diagnosis not present

## 2017-05-07 DIAGNOSIS — T148XXA Other injury of unspecified body region, initial encounter: Secondary | ICD-10-CM | POA: Diagnosis not present

## 2017-05-07 DIAGNOSIS — I10 Essential (primary) hypertension: Secondary | ICD-10-CM | POA: Diagnosis not present

## 2017-05-07 DIAGNOSIS — I2582 Chronic total occlusion of coronary artery: Secondary | ICD-10-CM | POA: Diagnosis not present

## 2017-05-07 DIAGNOSIS — Z7902 Long term (current) use of antithrombotics/antiplatelets: Secondary | ICD-10-CM | POA: Diagnosis not present

## 2017-05-07 DIAGNOSIS — I2510 Atherosclerotic heart disease of native coronary artery without angina pectoris: Secondary | ICD-10-CM | POA: Diagnosis not present

## 2017-05-07 DIAGNOSIS — Z951 Presence of aortocoronary bypass graft: Secondary | ICD-10-CM | POA: Diagnosis not present

## 2017-05-07 DIAGNOSIS — I482 Chronic atrial fibrillation: Secondary | ICD-10-CM | POA: Diagnosis not present

## 2017-05-14 DIAGNOSIS — I2510 Atherosclerotic heart disease of native coronary artery without angina pectoris: Secondary | ICD-10-CM | POA: Diagnosis not present

## 2017-05-21 DIAGNOSIS — I2510 Atherosclerotic heart disease of native coronary artery without angina pectoris: Secondary | ICD-10-CM | POA: Diagnosis not present

## 2017-05-28 DIAGNOSIS — I2510 Atherosclerotic heart disease of native coronary artery without angina pectoris: Secondary | ICD-10-CM | POA: Diagnosis not present

## 2018-04-09 DEATH — deceased
